# Patient Record
Sex: Male | Born: 1990 | Race: White | Hispanic: No | Marital: Single | Smoking: Former smoker
Health system: Southern US, Community
[De-identification: ages and names within clinical notes are randomized; demographics above are authoritative.]

## PROBLEM LIST (undated history)

## (undated) DIAGNOSIS — M1990 Unspecified osteoarthritis, unspecified site: Secondary | ICD-10-CM

## (undated) DIAGNOSIS — F419 Anxiety disorder, unspecified: Secondary | ICD-10-CM

## (undated) DIAGNOSIS — J45909 Unspecified asthma, uncomplicated: Secondary | ICD-10-CM

## (undated) DIAGNOSIS — J349 Unspecified disorder of nose and nasal sinuses: Secondary | ICD-10-CM

## (undated) DIAGNOSIS — R519 Headache, unspecified: Secondary | ICD-10-CM

## (undated) DIAGNOSIS — R51 Headache: Secondary | ICD-10-CM

## (undated) DIAGNOSIS — N50819 Testicular pain, unspecified: Secondary | ICD-10-CM

## (undated) HISTORY — PX: TONSILLECTOMY: SUR1361

## (undated) HISTORY — DX: Testicular pain, unspecified: N50.819

## (undated) HISTORY — PX: WISDOM TOOTH EXTRACTION: SHX21

## (undated) HISTORY — DX: Anxiety disorder, unspecified: F41.9

## (undated) HISTORY — DX: Unspecified disorder of nose and nasal sinuses: J34.9

## (undated) HISTORY — PX: ELBOW SURGERY: SHX618

## (undated) HISTORY — PX: NASAL SINUS SURGERY: SHX719

---

## 2012-04-08 ENCOUNTER — Ambulatory Visit: Payer: Self-pay | Admitting: Otolaryngology

## 2015-01-27 ENCOUNTER — Ambulatory Visit
Admit: 2015-01-27 | Disposition: A | Discharge status: Home / Self Care | Payer: Self-pay | Attending: Family Medicine | Admitting: Family Medicine

## 2015-01-27 LAB — RAPID INFLUENZA A&B ANTIGENS

## 2015-07-15 ENCOUNTER — Ambulatory Visit
Admission: EM | Admit: 2015-07-15 | Discharge: 2015-07-15 | Disposition: A | Discharge status: Home / Self Care | Payer: Managed Care, Other (non HMO) | Attending: Family Medicine | Admitting: Family Medicine

## 2015-07-15 ENCOUNTER — Encounter: Payer: Self-pay | Admitting: Gynecology

## 2015-07-15 DIAGNOSIS — J0110 Acute frontal sinusitis, unspecified: Secondary | ICD-10-CM

## 2015-07-15 DIAGNOSIS — J0100 Acute maxillary sinusitis, unspecified: Secondary | ICD-10-CM | POA: Diagnosis not present

## 2015-07-15 MED ORDER — AMOXICILLIN-POT CLAVULANATE 875-125 MG PO TABS: 1.0000 | ORAL_TABLET | Freq: Two times a day (BID) | ORAL | Status: DC

## 2015-07-15 MED ORDER — IBUPROFEN 800 MG PO TABS: 800.0000 mg | ORAL_TABLET | Freq: Three times a day (TID) | ORAL | Status: DC | PRN

## 2015-07-15 NOTE — ED Notes (Signed)
Patient c/o sinus headace / pressure x 2-3 weeks.

## 2015-07-15 NOTE — ED Provider Notes (Signed)
[facility] Emergency Department Provider Note  ____________________________________________  Time seen: Approximately 5:14 PM  I have reviewed the triage vital signs and the nursing notes.   HISTORY  Chief Complaint URI   HPI ELBY BLACKWELDER is a 24 y.o. male presents for complaints of runny nose, nasal congestion and sinus pressure x 2-3 weeks. States started out as runny nose and has progressed with sinus pressure. Denies fevers. Denies chest pain, shortness of breath, neck pain, abdominal pain. Reports continues to eat and drink well. Using flonase  And zyrtec D OTC which helped some but did not fully resolve.    History reviewed. No pertinent past medical history.  There are no active problems to display for this patient.   Past Surgical History  Procedure Laterality Date  . Tonsillectomy    . Wisdom tooth extraction    . Elbow surgery      right    No current outpatient prescriptions on file.  Allergies Review of patient's allergies indicates no known allergies.  No family history on file.  Social History Social History  Substance Use Topics  . Smoking status: Never Smoker   . Smokeless tobacco: None  . Alcohol Use: Yes    Review of Systems Constitutional: No fever/chills Eyes: No visual changes. ZOX:WRUEA nose, nasal congestion and sinus pressure.  Cardiovascular: Denies chest pain. Respiratory: Denies shortness of breath. Gastrointestinal: No abdominal pain.  No nausea, no vomiting.  No diarrhea.  No constipation. Genitourinary: Negative for dysuria. Musculoskeletal: Negative for back pain. Skin: Negative for rash. Neurological: Negative for headaches, focal weakness or numbness.  10-point ROS otherwise negative.  ____________________________________________   PHYSICAL EXAM:  VITAL SIGNS: ED Triage Vitals  Enc Vitals Group     BP 07/15/15 1607 130/70 mmHg     Pulse Rate 07/15/15 1607 60     Resp 07/15/15 1607 16   Temp 07/15/15 1607 98.4 F (36.9 C)     Temp Source 07/15/15 1607 Oral     SpO2 07/15/15 1607 100 %     Weight 07/15/15 1607 170 lb (77.111 kg)     Height 07/15/15 1607  (1.956 m)     Head Cir --      Peak Flow --      Pain Score 07/15/15 1613 7     Pain Loc --      Pain Edu? --      Excl. in GC? --     Constitutional: Alert and oriented. Well appearing and in no acute distress. Eyes: Conjunctivae are normal. PERRL. EOMI. Head: Atraumatic. Mod TTP maxillary and frontal sinus TTP, no erythema or swelling.   Ears: no erythema,bilateral TM dullness.   Nose: purulent nasal discharge, with bilateral nasal turbinate edema.   Mouth/Throat: Mucous membranes are moist.  Oropharynx non-erythematous. Neck: No stridor.  No cervical spine tenderness to palpation. Hematological/Lymphatic/Immunilogical: No cervical lymphadenopathy. Cardiovascular: Normal rate, regular rhythm. Grossly normal heart sounds.  Good peripheral circulation. Respiratory: Normal respiratory effort.  No retractions. Lungs CTAB. Gastrointestinal: Soft and nontender. No distention. Normal Bowel sounds.   Musculoskeletal: No lower or upper extremity tenderness nor edema.  No joint effusions. Bilateral pedal pulses equal and easily palpated.  Neurologic:  Normal speech and language. No gross focal neurologic deficits are appreciated. No gait instability. Skin:  Skin is warm, dry and intact. No rash noted. Psychiatric: Mood and affect are normal. Speech and behavior are normal.  ____________________________________________   LABS (all labs ordered are listed, but only  abnormal results are displayed)  Labs Reviewed - No data to display  INITIAL IMPRESSION / ASSESSMENT AND PLAN / ED COURSE  Pertinent labs & imaging results that were available during my care of the patient were reviewed by me and considered in my medical decision making (see chart for details).  Presents for runny nose, congestion, sinus pressure x 2  weeks. Reports unrelieved with OTC medications. Will treat maxillary and frontal sinusitis with augmentin and prn ibuprofen. Supportive treatments, rest, fluids. Discussed follow up with Primary care physician this week. Discussed follow up and return parameters including no resolution or any worsening concerns. Patient verbalized understanding and agreed to plan.   ____________________________________________   FINAL CLINICAL IMPRESSION(S) / ED DIAGNOSES  Final diagnoses:  Acute maxillary sinusitis, recurrence not specified  Acute frontal sinusitis, recurrence not specified       Renford Dills, NP 07/15/15 1731

## 2015-07-15 NOTE — Discharge Instructions (Signed)
Take medication as prescribed. Rest. Drink plenty of fluids.   Follow up with your primary care physician as needed. Return to Urgent care for new or worsening concerns.   Sinusitis Sinusitis is redness, soreness, and inflammation of the paranasal sinuses. Paranasal sinuses are air pockets within the bones of your face (beneath the eyes, the middle of the forehead, or above the eyes). In healthy paranasal sinuses, mucus is able to drain out, and air is able to circulate through them by way of your nose. However, when your paranasal sinuses are inflamed, mucus and air can become trapped. This can allow bacteria and other germs to grow and cause infection. Sinusitis can develop quickly and last only a short time (acute) or continue over a long period (chronic). Sinusitis that lasts for more than 12 weeks is considered chronic.  CAUSES  Causes of sinusitis include:  Allergies.  Structural abnormalities, such as displacement of the cartilage that separates your nostrils (deviated septum), which can decrease the air flow through your nose and sinuses and affect sinus drainage.  Functional abnormalities, such as when the small hairs (cilia) that line your sinuses and help remove mucus do not work properly or are not present. SIGNS AND SYMPTOMS  Symptoms of acute and chronic sinusitis are the same. The primary symptoms are pain and pressure around the affected sinuses. Other symptoms include:  Upper toothache.  Earache.  Headache.  Bad breath.  Decreased sense of smell and taste.  A cough, which worsens when you are lying flat.  Fatigue.  Fever.  Thick drainage from your nose, which often is green and may contain pus (purulent).  Swelling and warmth over the affected sinuses. DIAGNOSIS  Your health care provider will perform a physical exam. During the exam, your health care provider may:  Look in your nose for signs of abnormal growths in your nostrils (nasal polyps).  Tap over  the affected sinus to check for signs of infection.  View the inside of your sinuses (endoscopy) using an imaging device that has a light attached (endoscope). If your health care provider suspects that you have chronic sinusitis, one or more of the following tests may be recommended:  Allergy tests.  Nasal culture. A sample of mucus is taken from your nose, sent to a lab, and screened for bacteria.  Nasal cytology. A sample of mucus is taken from your nose and examined by your health care provider to determine if your sinusitis is related to an allergy. TREATMENT  Most cases of acute sinusitis are related to a viral infection and will resolve on their own within 10 days. Sometimes medicines are prescribed to help relieve symptoms (pain medicine, decongestants, nasal steroid sprays, or saline sprays).  However, for sinusitis related to a bacterial infection, your health care provider will prescribe antibiotic medicines. These are medicines that will help kill the bacteria causing the infection.  Rarely, sinusitis is caused by a fungal infection. In theses cases, your health care provider will prescribe antifungal medicine. For some cases of chronic sinusitis, surgery is needed. Generally, these are cases in which sinusitis recurs more than 3 times per year, despite other treatments. HOME CARE INSTRUCTIONS   Drink plenty of water. Water helps thin the mucus so your sinuses can drain more easily.  Use a humidifier.  Inhale steam 3 to 4 times a day (for example, sit in the bathroom with the shower running).  Apply a warm, moist washcloth to your face 3 to 4 times a day, or  as directed by your health care provider.  Use saline nasal sprays to help moisten and clean your sinuses.  Take medicines only as directed by your health care provider.  If you were prescribed either an antibiotic or antifungal medicine, finish it all even if you start to feel better. SEEK IMMEDIATE MEDICAL CARE  IF:  You have increasing pain or severe headaches.  You have nausea, vomiting, or drowsiness.  You have swelling around your face.  You have vision problems.  You have a stiff neck.  You have difficulty breathing. MAKE SURE YOU:   Understand these instructions.  Will watch your condition.  Will get help right away if you are not doing well or get worse. Document Released: 10/07/2005 Document Revised: 02/21/2014 Document Reviewed: 10/22/2011 [facility] Patient Information 2015 [place], [place]. This information is not intended to replace advice given to you by your health care provider. Make sure you discuss any questions you have with your health care provider.

## 2015-07-22 ENCOUNTER — Ambulatory Visit
Admission: EM | Admit: 2015-07-22 | Discharge: 2015-07-22 | Disposition: A | Discharge status: Home / Self Care | Payer: Managed Care, Other (non HMO) | Attending: Internal Medicine | Admitting: Internal Medicine

## 2015-07-22 DIAGNOSIS — J019 Acute sinusitis, unspecified: Secondary | ICD-10-CM | POA: Diagnosis not present

## 2015-07-22 MED ORDER — PREDNISONE 50 MG PO TABS: 50.0000 mg | ORAL_TABLET | Freq: Every day | ORAL | Status: DC

## 2015-07-22 NOTE — Discharge Instructions (Signed)
Prescription for prednisone sent to the pharmacy. Consider followup with ENT to discuss additional management of sinus difficulties.

## 2015-07-22 NOTE — ED Notes (Signed)
Pt you were here Sept 24th dx with sinusitis given augmentin and ibuprofen, has 4 days left and started to feel better until this morning when sinus pressure came back and symptoms are as bad as they were on the 24th

## 2015-07-22 NOTE — ED Provider Notes (Signed)
CSN: [id number]     Arrival date & time 07/22/15  1426 History   First MD Initiated Contact with Patient 07/22/15 1541     Chief Complaint  Patient presents with  . Sinusitis   HPI  Patient is a 24 year old who was seen here a week ago with 2-3 weeks history of sinus symptoms. He was treated with Augmentin, and improved for several days, but now in the last 3 days is feeling worse again. By worse, he means that he is having more congestion, some discomfort in his teeth again and facial pressure/headache. He's also having a little bit of dizziness, off balance feeling, but has not fallen. No fever, no runny nose, and cough is improving.  History reviewed. No pertinent past medical history. Past Surgical History  Procedure Laterality Date  . Tonsillectomy    . Wisdom tooth extraction    . Elbow surgery      right    Social History  Substance Use Topics  . Smoking status: Never Smoker   . Smokeless tobacco: None  . Alcohol Use: Yes    Review of Systems  All other systems reviewed and are negative.   Allergies  Review of patient's allergies indicates no known allergies.  Home Medications   Prior to Admission medications   Medication Sig Start Date End Date Taking? Authorizing Provider  cetirizine (ZYRTEC) 10 MG tablet Take 10 mg by mouth daily.   Yes Historical Provider, MD  fluticasone (FLONASE) 50 MCG/ACT nasal spray Place into both nostrils daily.   Yes Historical Provider, MD  amoxicillin-clavulanate (AUGMENTIN) 875-125 MG per tablet Take 1 tablet by mouth every 12 (twelve) hours. 07/15/15   Renford Dills, NP  ibuprofen (ADVIL,MOTRIN) 800 MG tablet Take 1 tablet (800 mg total) by mouth every 8 (eight) hours as needed for mild pain or moderate pain. 07/15/15   Renford Dills, NP  predniSONE (DELTASONE) 50 MG tablet Take 1 tablet (50 mg total) by mouth daily. 07/22/15   Eustace Moore, MD   Meds Ordered and Administered this Visit  Medications - No data to display  BP 132/85  mmHg  Pulse 62  Temp(Src) 97.8 F (36.6 C) (Oral)  Ht 6' (1.829 m)  Wt 160 lb 6.4 oz (72.757 kg)  BMI 21.75 kg/m2  SpO2 100% No data found.   Physical Exam  Constitutional: He is oriented to person, place, and time. No distress.  Alert, nicely groomed  HENT:  Head: Atraumatic.  Bilateral TMs are moderately dull, right greater than left, no erythema Moderately severe nasal congestion, with erythema but no visible ulceration Throat is a little bit red  Eyes:  Conjugate gaze, no eye redness/drainage  Neck: Neck supple.  Cardiovascular: Normal rate and regular rhythm.   Pulmonary/Chest: No respiratory distress.  Lungs clear, symmetric breath sounds  Abdominal: Soft. He exhibits no distension.  Musculoskeletal: Normal range of motion.  Neurological: He is alert and oriented to person, place, and time.  Skin: Skin is warm and dry.  Pink. No cyanosis  Nursing note and vitals reviewed.   ED Course  Procedures (including critical care time)    MDM   1. Acute sinusitis with symptoms > 10 days    Finish Augmentin. Prescription for prednisone burst, 3-5 days. Continue Zyrtec-D and Flonase. Recheck for new fever greater than 100.5, increasing phlegm production, or if not starting to improve in several days.    Eustace Moore, MD 07/22/15 [id number]

## 2015-08-02 ENCOUNTER — Encounter: Payer: Self-pay | Admitting: Emergency Medicine

## 2015-08-02 ENCOUNTER — Ambulatory Visit
Admission: EM | Admit: 2015-08-02 | Discharge: 2015-08-02 | Disposition: A | Discharge status: Home / Self Care | Payer: Managed Care, Other (non HMO) | Attending: Family Medicine | Admitting: Family Medicine

## 2015-08-02 DIAGNOSIS — J019 Acute sinusitis, unspecified: Secondary | ICD-10-CM

## 2015-08-02 MED ORDER — CEFUROXIME AXETIL 250 MG PO TABS: 250.0000 mg | ORAL_TABLET | Freq: Two times a day (BID) | ORAL | Status: DC

## 2015-08-02 NOTE — ED Notes (Signed)
Patient c/o sinus pain and pressure and HAs fore several weeks.  Patient denies fevers.

## 2015-08-02 NOTE — ED Provider Notes (Signed)
CSN: [id number]     Arrival date & time 08/02/15  [id number] History   First MD Initiated Contact with Patient 08/02/15 [phone]     Chief Complaint  Patient presents with  . Facial Pain  . Headache   (Consider location/radiation/quality/duration/timing/severity/associated sxs/prior Treatment) HPI  This 24 year old male who returns today complaining of increasing sinus pain and pressure and headaches are not responded to the use of Augmentin and prednisone. He states that he will usually have one episode of sinusitis a year that responds well to the Augmentin and on occasion will require prednisone. He uses [place] daily along with Flonase and Zyrtec-D. He states that he wakes in the morning with his eyes swollen and very stuffy sinus passage and after use of Nettie pot and Flonase and  first dose  of Zyrtec-D his symptoms improve by mid morning but then returns in the afternoon. He is becoming "worn out" because of the chronicity not improving despite his best effort. Denies any fever or chills. He works at a [employer] company using a multitude of chemicals but states that he does not seem to worsen his symptoms as he spends a great deal was time in college at [employer] [place]. He has a long history of allergies.  History reviewed. No pertinent past medical history. Past Surgical History  Procedure Laterality Date  . Tonsillectomy    . Wisdom tooth extraction    . Elbow surgery      right   History reviewed. No pertinent family history. Social History  Substance Use Topics  . Smoking status: Never Smoker   . Smokeless tobacco: None  . Alcohol Use: Yes    Review of Systems  Constitutional: Positive for activity change. Negative for fever, chills and fatigue.  HENT: Positive for congestion, facial swelling, postnasal drip, rhinorrhea, sinus pressure, sneezing and sore throat.   Eyes: Positive for itching.  All other systems reviewed and are negative.   Allergies  Review  of patient's allergies indicates no known allergies.  Home Medications   Prior to Admission medications   Medication Sig Start Date End Date Taking? Authorizing Provider  amoxicillin-clavulanate (AUGMENTIN) 875-125 MG per tablet Take 1 tablet by mouth every 12 (twelve) hours. 07/15/15   Renford DillsLindsey Miller, NP  cefUROXime (CEFTIN) 250 MG tablet Take 1 tablet (250 mg total) by mouth 2 (two) times daily with a meal. 08/02/15   Lutricia FeilWilliam P Brolin Dambrosia, PA-C  cetirizine (ZYRTEC) 10 MG tablet Take 10 mg by mouth daily.    Historical Provider, MD  fluticasone (FLONASE) 50 MCG/ACT nasal spray Place into both nostrils daily.    Historical Provider, MD  ibuprofen (ADVIL,MOTRIN) 800 MG tablet Take 1 tablet (800 mg total) by mouth every 8 (eight) hours as needed for mild pain or moderate pain. 07/15/15   Renford DillsLindsey Miller, NP  predniSONE (DELTASONE) 50 MG tablet Take 1 tablet (50 mg total) by mouth daily. 07/22/15   Eustace MooreLaura W Murray, MD   Meds Ordered and Administered this Visit  Medications - No data to display  BP 140/97 mmHg  Pulse 96  Temp(Src) 98.1 F (36.7 C) (Oral)  Resp 16  Ht 6' (1.829 m)  Wt 165 lb (74.844 kg)  BMI 22.37 kg/m2  SpO2 100% No data found.   Physical Exam  Constitutional: He is oriented to person, place, and time. He appears well-developed and well-nourished. No distress.  HENT:  Head: Normocephalic and atraumatic.  Both ears are dull with the right greater than the  left. He has a significant percussion pain over maxillary sinuses. The oropharynx is benign. He has no anterior cervical adenopathy appreciated.  Eyes: Pupils are equal, round, and reactive to light.  Neck: Neck supple.  Pulmonary/Chest: Effort normal and breath sounds normal. No respiratory distress. He has no wheezes. He has no rales.  Musculoskeletal: Normal range of motion. He exhibits no edema or tenderness.  Lymphadenopathy:    He has no cervical adenopathy.  Neurological: He is alert and oriented to person, place,  and time.  Skin: Skin is warm and dry. He is not diaphoretic.  Psychiatric: He has a normal mood and affect. His behavior is normal. Judgment and thought content normal.  Nursing note and vitals reviewed.   ED Course  Procedures (including critical care time)  Labs Review Labs Reviewed - No data to display  Imaging Review No results found.   Visual Acuity Review  Right Eye Distance:   Left Eye Distance:   Bilateral Distance:    Right Eye Near:   Left Eye Near:    Bilateral Near:         MDM   1. Acute sinusitis treated with antibiotics in the past 60 days    Discharge Medication List as of 08/02/2015 10:27 AM    START taking these medications   Details  cefUROXime (CEFTIN) 250 MG tablet Take 1 tablet (250 mg total) by mouth 2 (two) times daily with a meal., Starting 08/02/2015, Until Discontinued, Print      Plan: 1. Test/x-ray results and diagnosis reviewed with patient 2. rx as per orders; risks, benefits, potential side effects reviewed with patient 3. Recommend supportive treatment with continued use of a Nettie pot and Flonase and Zyrtec. 4. F/u Dr. Carron Curie, ENT for evaluation and further treatment cause of the chronicity of his problem. I have told him that he may have more of an allergic sinusitis than a bacterial or possibly Dr. Carron Curie be able to  sort this out. I will attempt 1 more trial of antibiotics and switched him to Ceftin 250 twice a day for 3 weeks pending his appointment with ENT. He will not change the use of the Nettie pot Flonase and Zyrtec. He was given instructions for ENT office and he will make the appointment according to his schedule.   Lutricia Feil, PA-C 08/02/15 1034

## 2015-08-02 NOTE — Discharge Instructions (Signed)

## 2015-08-24 ENCOUNTER — Encounter: Payer: Self-pay | Admitting: *Deleted

## 2015-08-29 NOTE — Discharge Instructions (Signed)
[facility] °[facility] °ENDOSCOPIC SINUS SURGERY °[facility] ° °What is Functional Endoscopic Sinus Surgery? ° The Surgery involves making the natural openings of the sinuses larger by removing the bony partitions that separate the sinuses from the nasal cavity.  The natural sinus lining is preserved as much as possible to allow the sinuses to resume normal function after the surgery.  In some patients nasal polyps (excessively swollen lining of the sinuses) may be removed to relieve obstruction of the sinus openings.  The surgery is performed through the nose using lighted scopes, which eliminates the need for incisions on the face.  A septoplasty is a different procedure which is sometimes performed with sinus surgery.  It involves straightening the boy partition that separates the two sides of your nose.  A crooked or deviated septum may need repair if is obstructing the sinuses or nasal airflow.  Turbinate reduction is also often performed during sinus surgery.  The turbinates are bony proturberances from the side walls of the nose which swell and can obstruct the nose in patients with sinus and allergy problems.  Their size can be surgically reduced to help relieve nasal obstruction. ° °What Can Sinus Surgery Do For Me? ° Sinus surgery can reduce the frequency of sinus infections requiring antibiotic treatment.  This can provide improvement in nasal congestion, post-nasal drainage, facial pressure and nasal obstruction.  Surgery will NOT prevent you from ever having an infection again, so it usually only for patients who get infections 4 or more times yearly requiring antibiotics, or for infections that do not clear with antibiotics.  It will not cure nasal allergies, so patients with allergies may still require medication to treat their allergies after surgery. Surgery may improve headaches related to sinusitis, however, some people will continue to  require medication to control sinus headaches related to allergies.  Surgery will do nothing for other forms of headache (migraine, tension or cluster). ° °What Are the Risks of Endoscopic Sinus Surgery? ° Current techniques allow surgery to be performed safely with little risk, however, there are rare complications that patients should be aware of.  Because the sinuses are located around the eyes, there is risk of eye injury, including blindness, though again, this would be quite rare. This is usually a result of bleeding behind the eye during surgery, which puts the vision oat risk, though there are treatments to protect the vision and prevent permanent disrupted by surgery causing a leak of the spinal fluid that surrounds the brain.  More serious complications would include bleeding inside the brain cavity or damage to the brain.  Again, all of these complications are uncommon, and spinal fluid leaks can be safely managed surgically if they occur.  The most common complication of sinus surgery is bleeding from the nose, which may require packing or cauterization of the nose.  Continued sinus have polyps may experience recurrence of the polyps requiring revision surgery.  Alterations of sense of smell or injury to the tear ducts are also rare complications.  ° °What is the Surgery Like, and what is the Recovery? ° The Surgery usually takes a couple of hours to perform, and is usually performed under a general anesthetic (completely asleep).  Patients are usually discharged home after a couple of hours.  Sometimes during surgery it is necessary to pack the nose to control bleeding, and the packing is left in place for 24 - 48 hours, and removed by your surgeon.    If a septoplasty was performed during the procedure, there is often a splint placed which must be removed after 5-7 days.   °Discomfort: Pain is usually mild to moderate, and can be controlled by prescription pain medication or acetaminophen (Tylenol).   Aspirin, Ibuprofen (Advil, Motrin), or Naprosyn (Aleve) should be avoided, as they can cause increased bleeding.  Most patients feel sinus pressure like they have a bad head cold for several days.  Sleeping with your head elevated can help reduce swelling and facial pressure, as can ice packs over the face.  A humidifier may be helpful to keep the mucous and blood from drying in the nose.  ° °Diet: There are no specific diet restrictions, however, you should generally start with clear liquids and a light diet of bland foods because the anesthetic can cause some nausea.  Advance your diet depending on how your stomach feels.  Taking your pain medication with food will often help reduce stomach upset which pain medications can cause. ° °Nasal Saline Irrigation: It is important to remove blood clots and dried mucous from the nose as it is healing.  This is done by having you irrigate the nose at least 3 - 4 times daily with a salt water solution.  We recommend using NeilMed Sinus Rinse (available at the drug store).  Fill the squeeze bottle with the solution, bend over a sink, and insert the tip of the squeeze bottle into the nose ½ of an inch.  Point the tip of the squeeze bottle towards the inside corner of the eye on the same side your irrigating.  Squeeze the bottle and gently irrigate the nose.  If you bend forward as you do this, most of the fluid will flow back out of the nose, instead of down your throat.   The solution should be warm, near body temperature, when you irrigate.   Each time you irrigate, you should use a full squeeze bottle.  ° °Note that if you are instructed to use Nasal Steroid Sprays at any time after your surgery, irrigate with saline BEFORE using the steroid spray, so you do not wash it all out of the nose. °Another product, Nasal Saline Gel (such as AYR Nasal Saline Gel) can be applied in each nostril 3 - 4 times daily to moisture the nose and reduce scabbing or crusting. ° °Bleeding:   Bloody drainage from the nose can be expected for several days, and patients are instructed to irrigate their nose frequently with salt water to help remove mucous and blood clots.  The drainage may be dark red or brown, though some fresh blood may be seen intermittently, especially after irrigation.  Do not blow you nose, as bleeding may occur. If you must sneeze, keep your mouth open to allow air to escape through your mouth. ° °If heavy bleeding occurs: Irrigate the nose with saline to rinse out clots, then spray the nose 3 - 4 times with Afrin Nasal Decongestant Spray.  The spray will constrict the blood vessels to slow bleeding.  Pinch the lower half of your nose shut to apply pressure, and lay down with your head elevated.  Ice packs over the nose may help as well. If bleeding persists despite these measures, you should notify your doctor.  Do not use the Afrin routinely to control nasal congestion after surgery, as it can result in worsening congestion and may affect healing.  ° ° ° °Activity: Return to work varies among patients. Most patients will be   out of work at least 5 - 7 days to recover.  Patient may return to work after they are off of narcotic pain medication, and feeling well enough to perform the functions of their job.  Patients must avoid heavy lifting (over 10 pounds) or strenuous physical for 2 weeks after surgery, so your employer may need to assign you to light duty, or keep you out of work longer if light duty is not possible.  NOTE: you should not drive, operate dangerous machinery, do any mentally demanding tasks or make any important legal or financial decisions while on narcotic pain medication and recovering from the general anesthetic.  °  °Call Your Doctor Immediately if You Have Any of the Following: °1. Bleeding that you cannot control with the above measures °2. Loss of vision, double vision, bulging of the eye or black eyes. °3. Fever over 101 degrees °4. Neck stiffness with  severe headache, fever, nausea and change in mental state. °You are always encourage to call anytime with concerns, however, please call with requests for pain medication refills during office hours. ° °Office Endoscopy: During follow-up visits your doctor will remove any packing or splints that may have been placed and evaluate and clean your sinuses endoscopically.  Topical anesthetic will be used to make this as comfortable as possible, though you may want to take your pain medication prior to the visit.  How often this will need to be done varies from patient to patient.  After complete recovery from the surgery, you may need follow-up endoscopy from time to time, particularly if there is concern of recurrent infection or nasal polyps. ° °General Anesthesia, Adult, Care After °Refer to this sheet in the next few weeks. These instructions provide you with information on caring for yourself after your procedure. Your health care provider may also give you more specific instructions. Your treatment has been planned according to current medical practices, but problems sometimes occur. Call your health care provider if you have any problems or questions after your procedure. °WHAT TO EXPECT AFTER THE PROCEDURE °After the procedure, it is typical to experience: °· Sleepiness. °· Nausea and vomiting. °HOME CARE INSTRUCTIONS °· For the first 24 hours after general anesthesia: °¨ Have a responsible person with you. °¨ Do not drive a car. If you are alone, do not take public transportation. °¨ Do not drink alcohol. °¨ Do not take medicine that has not been prescribed by your health care provider. °¨ Do not sign important papers or make important decisions. °¨ You may resume a normal diet and activities as directed by your health care provider. °· Change bandages (dressings) as directed. °· If you have questions or problems that seem related to general anesthesia, call the hospital and ask for the anesthetist or  anesthesiologist on call. °SEEK MEDICAL CARE IF: °· You have nausea and vomiting that continue the day after anesthesia. °· You develop a rash. °SEEK IMMEDIATE MEDICAL CARE IF:  °· You have difficulty breathing. °· You have chest pain. °· You have any allergic problems. °  °This information is not intended to replace advice given to you by your health care provider. Make sure you discuss any questions you have with your health care provider. °  °Document Released: 01/13/2001 Document Revised: 10/28/2014 Document Reviewed: 02/05/2012 °Elsevier Interactive Patient Education ©2016 [employer] ° °

## 2015-08-30 ENCOUNTER — Ambulatory Visit: Payer: Managed Care, Other (non HMO) | Admitting: Anesthesiology

## 2015-08-30 ENCOUNTER — Encounter
Admission: RE | Disposition: A | Discharge status: Home / Self Care | Payer: Self-pay | Source: Ambulatory Visit | Attending: Otolaryngology

## 2015-08-30 ENCOUNTER — Ambulatory Visit: Admit: 2015-08-30 | Payer: Self-pay | Admitting: Otolaryngology

## 2015-08-30 ENCOUNTER — Encounter: Payer: Self-pay | Admitting: Otolaryngology

## 2015-08-30 ENCOUNTER — Ambulatory Visit
Admission: RE | Admit: 2015-08-30 | Discharge: 2015-08-30 | Disposition: A | Discharge status: Home / Self Care | Payer: Managed Care, Other (non HMO) | Source: Ambulatory Visit | Attending: Otolaryngology | Admitting: Otolaryngology

## 2015-08-30 DIAGNOSIS — J342 Deviated nasal septum: Secondary | ICD-10-CM | POA: Diagnosis present

## 2015-08-30 DIAGNOSIS — Z791 Long term (current) use of non-steroidal anti-inflammatories (NSAID): Secondary | ICD-10-CM | POA: Diagnosis not present

## 2015-08-30 DIAGNOSIS — M1990 Unspecified osteoarthritis, unspecified site: Secondary | ICD-10-CM | POA: Insufficient documentation

## 2015-08-30 DIAGNOSIS — J343 Hypertrophy of nasal turbinates: Secondary | ICD-10-CM | POA: Insufficient documentation

## 2015-08-30 DIAGNOSIS — J3489 Other specified disorders of nose and nasal sinuses: Secondary | ICD-10-CM | POA: Insufficient documentation

## 2015-08-30 DIAGNOSIS — J329 Chronic sinusitis, unspecified: Secondary | ICD-10-CM | POA: Diagnosis present

## 2015-08-30 DIAGNOSIS — Z7951 Long term (current) use of inhaled steroids: Secondary | ICD-10-CM | POA: Diagnosis not present

## 2015-08-30 HISTORY — DX: Unspecified asthma, uncomplicated: J45.909

## 2015-08-30 HISTORY — PX: SEPTOPLASTY: SHX2393

## 2015-08-30 HISTORY — DX: Unspecified osteoarthritis, unspecified site: M19.90

## 2015-08-30 HISTORY — PX: TURBINATE REDUCTION: SHX6157

## 2015-08-30 HISTORY — PX: IMAGE GUIDED SINUS SURGERY: SHX6570

## 2015-08-30 HISTORY — PX: MAXILLARY ANTROSTOMY: SHX2003

## 2015-08-30 HISTORY — DX: Headache, unspecified: R51.9

## 2015-08-30 HISTORY — DX: Headache: R51

## 2015-08-30 SURGERY — SEPTOPLASTY, NOSE, WITH NASAL TURBINATE REDUCTION
Anesthesia: General | Laterality: Bilateral

## 2015-08-30 SURGERY — SINUS SURGERY, WITH IMAGING GUIDANCE
Anesthesia: General | Wound class: Clean Contaminated

## 2015-08-30 MED ORDER — ROCURONIUM BROMIDE 100 MG/10ML IV SOLN
INTRAVENOUS | Status: DC | PRN
  Administered 2015-08-30: 30 mg via INTRAVENOUS

## 2015-08-30 MED ORDER — FENTANYL CITRATE (PF) 100 MCG/2ML IJ SOLN
INTRAMUSCULAR | Status: DC | PRN
  Administered 2015-08-30: 100 ug via INTRAVENOUS
  Administered 2015-08-30 (×2): 50 ug via INTRAVENOUS

## 2015-08-30 MED ORDER — ACETAMINOPHEN 10 MG/ML IV SOLN
1000.0000 mg | Freq: Once | INTRAVENOUS | Status: AC
  Administered 2015-08-30: 1000 mg via INTRAVENOUS

## 2015-08-30 MED ORDER — SUCCINYLCHOLINE CHLORIDE 20 MG/ML IJ SOLN
INTRAMUSCULAR | Status: DC | PRN
  Administered 2015-08-30: 100 mg via INTRAVENOUS

## 2015-08-30 MED ORDER — DEXAMETHASONE SODIUM PHOSPHATE 4 MG/ML IJ SOLN
INTRAMUSCULAR | Status: DC | PRN
  Administered 2015-08-30: 8 mg via INTRAVENOUS

## 2015-08-30 MED ORDER — PROMETHAZINE HCL 25 MG/ML IJ SOLN: 6.2500 mg | INTRAMUSCULAR | Status: DC | PRN

## 2015-08-30 MED ORDER — LACTATED RINGERS IV SOLN
INTRAVENOUS | Status: DC
  Administered 2015-08-30: 10:00:00 via INTRAVENOUS

## 2015-08-30 MED ORDER — OXYCODONE HCL 5 MG PO TABS
5.0000 mg | ORAL_TABLET | Freq: Once | ORAL | Status: AC | PRN
  Administered 2015-08-30: 5 mg via ORAL

## 2015-08-30 MED ORDER — OXYMETAZOLINE HCL 0.05 % NA SOLN
NASAL | Status: DC | PRN
  Administered 2015-08-30: 1 via TOPICAL

## 2015-08-30 MED ORDER — LIDOCAINE-EPINEPHRINE 1 %-1:100000 IJ SOLN
INTRAMUSCULAR | Status: DC | PRN
  Administered 2015-08-30: 8 mL

## 2015-08-30 MED ORDER — LIDOCAINE HCL (CARDIAC) 20 MG/ML IV SOLN
INTRAVENOUS | Status: DC | PRN
  Administered 2015-08-30: 40 mg via INTRAVENOUS

## 2015-08-30 MED ORDER — PROMETHAZINE HCL 12.5 MG PO TABS: 12.5000 mg | ORAL_TABLET | Freq: Four times a day (QID) | ORAL | Status: DC | PRN

## 2015-08-30 MED ORDER — MIDAZOLAM HCL 5 MG/5ML IJ SOLN
INTRAMUSCULAR | Status: DC | PRN
  Administered 2015-08-30: 2 mg via INTRAVENOUS

## 2015-08-30 MED ORDER — ONDANSETRON HCL 4 MG/2ML IJ SOLN
INTRAMUSCULAR | Status: DC | PRN
  Administered 2015-08-30: 4 mg via INTRAVENOUS

## 2015-08-30 MED ORDER — MEPERIDINE HCL 25 MG/ML IJ SOLN: 6.2500 mg | INTRAMUSCULAR | Status: DC | PRN

## 2015-08-30 MED ORDER — HYDROMORPHONE HCL 1 MG/ML IJ SOLN
0.2500 mg | INTRAMUSCULAR | Status: DC | PRN
  Administered 2015-08-30: 0.4 mg via INTRAVENOUS
  Administered 2015-08-30: 0.5 mg via INTRAVENOUS
  Administered 2015-08-30: 0.25 mg via INTRAVENOUS
  Administered 2015-08-30: 0.4 mg via INTRAVENOUS
  Administered 2015-08-30: 0.25 mg via INTRAVENOUS

## 2015-08-30 MED ORDER — BACITRACIN 500 UNIT/GM EX OINT
TOPICAL_OINTMENT | CUTANEOUS | Status: DC | PRN
  Administered 2015-08-30: 1 via TOPICAL

## 2015-08-30 MED ORDER — OXYCODONE HCL 5 MG/5ML PO SOLN: 5.0000 mg | Freq: Once | ORAL | Status: AC | PRN

## 2015-08-30 MED ORDER — PROPOFOL 10 MG/ML IV BOLUS
INTRAVENOUS | Status: DC | PRN
  Administered 2015-08-30: 200 mg via INTRAVENOUS

## 2015-08-30 MED ORDER — OXYCODONE-ACETAMINOPHEN 5-325 MG PO TABS: 1.0000 | ORAL_TABLET | ORAL | Status: DC | PRN

## 2015-08-30 MED ORDER — SULFAMETHOXAZOLE-TRIMETHOPRIM 800-160 MG PO TABS: 1.0000 | ORAL_TABLET | Freq: Two times a day (BID) | ORAL | Status: DC

## 2015-08-30 MED ORDER — SUCCINYLCHOLINE CHLORIDE 20 MG/ML IJ SOLN: INTRAMUSCULAR | Status: DC | PRN

## 2015-08-30 SURGICAL SUPPLY — 40 items
BALLOON SINUPLASTY SYSTEM (BALLOONS) IMPLANT
BATTERY INSTRU NAVIGATION (MISCELLANEOUS) ×12 IMPLANT
BLADE IRRIGATOR 40D CVD (IRRIGATION / IRRIGATOR) IMPLANT
CANISTER SUCT 1200ML W/VALVE (MISCELLANEOUS) ×3 IMPLANT
COAG SUCT 10F 3.5MM HAND CTRL (MISCELLANEOUS) ×3 IMPLANT
DEVICE INFLATION SEID (MISCELLANEOUS) IMPLANT
DRAPE HEAD BAR (DRAPES) ×3 IMPLANT
DRESSING NASL FOAM PST OP SINU (MISCELLANEOUS) ×4 IMPLANT
DRSG NASAL 4CM NASOPORE (MISCELLANEOUS) IMPLANT
DRSG NASAL FOAM POST OP SINU (MISCELLANEOUS) ×6
GLOVE BIO SURGEON STRL SZ7.5 (GLOVE) ×12 IMPLANT
GOWN STRL REUS W/ TWL LRG LVL3 (GOWN DISPOSABLE) ×2 IMPLANT
GOWN STRL REUS W/TWL LRG LVL3 (GOWN DISPOSABLE) ×1
IRRIGATOR 4MM STR (IRRIGATION / IRRIGATOR) IMPLANT
IV NS 500ML (IV SOLUTION)
IV NS 500ML BAXH (IV SOLUTION) IMPLANT
NAVIGATION MASK REG  ST (MISCELLANEOUS) ×3 IMPLANT
NEEDLE HYPO 25GX1X1/2 BEV (NEEDLE) ×3 IMPLANT
NS IRRIG 500ML POUR BTL (IV SOLUTION) ×3 IMPLANT
PACK DRAPE NASAL/ENT (PACKS) ×3 IMPLANT
PACKING NASAL EPIS 4X2.4 XEROG (MISCELLANEOUS) IMPLANT
PAD GROUND ADULT SPLIT (MISCELLANEOUS) ×3 IMPLANT
PATTIES SURGICAL .5 X3 (DISPOSABLE) ×3 IMPLANT
SET HANDPIECE IRR DIEGO (MISCELLANEOUS) IMPLANT
SINUPLASTY SPHENOID GUIDE (MISCELLANEOUS) IMPLANT
SOL ANTI-FOG 6CC FOG-OUT (MISCELLANEOUS) ×2 IMPLANT
SOL FOG-OUT ANTI-FOG 6CC (MISCELLANEOUS) ×1
SPLINT NASAL SEPTAL PRE-CUT (MISCELLANEOUS) IMPLANT
STRAP BODY AND KNEE 60X3 (MISCELLANEOUS) ×6 IMPLANT
SUT CHROMIC 4 0 RB 1X27 (SUTURE) ×3 IMPLANT
SUT ETHILON 3-0 FS-10 30 BLK (SUTURE)
SUT ETHILON 4-0 (SUTURE) ×1
SUT ETHILON 4-0 FS2 18XMFL BLK (SUTURE) ×2
SUT PLAIN GUT 4-0 (SUTURE) IMPLANT
SUTURE EHLN 3-0 FS-10 30 BLK (SUTURE) IMPLANT
SUTURE ETHLN 4-0 FS2 18XMF BLK (SUTURE) ×2 IMPLANT
SYR BULB EAR ULCER 3OZ GRN STR (SYRINGE) ×3 IMPLANT
SYRINGE 10CC LL (SYRINGE) ×3 IMPLANT
TOWEL OR 17X26 4PK STRL BLUE (TOWEL DISPOSABLE) IMPLANT
WATER STERILE IRR 500ML POUR (IV SOLUTION) IMPLANT

## 2015-08-30 NOTE — Anesthesia Postprocedure Evaluation (Signed)
  Anesthesia Post-op Note  Patient: Dominic Barnes  Procedure(s) Performed: Procedure(s) with comments: IMAGE GUIDED SINUS SURGERY (N/A) - GAVE DISK TO [name] 11/3 MAXILLARY ANTROSTOMY (Bilateral) SEPTOPLASTY (N/A) INFERIOR TURBINATE REDUCTION  (Bilateral)  Anesthesia type:General  Patient location: PACU  Post pain: Pain level controlled  Post assessment: Post-op Vital signs reviewed, Patient's Cardiovascular Status Stable, Respiratory Function Stable, Patent Airway and No signs of Nausea or vomiting  Post vital signs: Reviewed and stable  Last Vitals:  Filed Vitals:   08/30/15 1400  BP: 137/82  Pulse: 68  Temp:   Resp: 11    Level of consciousness: awake, alert  and patient cooperative  Complications: No apparent anesthesia complications

## 2015-08-30 NOTE — Anesthesia Procedure Notes (Signed)
Procedure Name: Intubation Date/Time: 08/30/2015 10:50 AM Performed by: Andee PolesBUSH, Milla Wahlberg Pre-anesthesia Checklist: Patient identified, Emergency Drugs available, Suction available, Patient being monitored and Timeout performed Patient Re-evaluated:Patient Re-evaluated prior to inductionOxygen Delivery Method: Circle system utilized Preoxygenation: Pre-oxygenation with 100% oxygen Intubation Type: IV induction Ventilation: Mask ventilation without difficulty Laryngoscope Size: Mac and 3 Grade View: Grade I Tube type: Oral Rae Tube size: 7.5 mm Number of attempts: 1 Placement Confirmation: ETT inserted through vocal cords under direct vision,  positive ETCO2 and breath sounds checked- equal and bilateral Tube secured with: Tape Dental Injury: Teeth and Oropharynx as per pre-operative assessment

## 2015-08-30 NOTE — H&P (Signed)
..  History and Physical paper copy reviewed and updated date of procedure and will be scanned into system.  

## 2015-08-30 NOTE — Anesthesia Preprocedure Evaluation (Signed)
Anesthesia Evaluation  Patient identified by MRN, date of birth, ID band Patient awake    Reviewed: Allergy & Precautions, NPO status , Patient's Chart, lab work & pertinent test results, reviewed documented beta blocker date and time   Airway Mallampati: I  TM Distance: >3 FB Neck ROM: Full    Dental no notable dental hx.    Pulmonary asthma (excercise induced) , former smoker,    Pulmonary exam normal        Cardiovascular negative cardio ROS Normal cardiovascular exam     Neuro/Psych  Headaches, negative psych ROS   GI/Hepatic negative GI ROS, Neg liver ROS,   Endo/Other  negative endocrine ROS  Renal/GU negative Renal ROS     Musculoskeletal negative musculoskeletal ROS (+)   Abdominal   Peds negative pediatric ROS (+)  Hematology negative hematology ROS (+)   Anesthesia Other Findings   Reproductive/Obstetrics                             Anesthesia Physical Anesthesia Plan  ASA: II  Anesthesia Plan: General   Post-op Pain Management:    Induction: Intravenous  Airway Management Planned: Oral ETT  Additional Equipment:   Intra-op Plan:   Post-operative Plan:   Informed Consent: I have reviewed the patients History and Physical, chart, labs and discussed the procedure including the risks, benefits and alternatives for the proposed anesthesia with the patient or authorized representative who has indicated his/her understanding and acceptance.     Plan Discussed with: CRNA  Anesthesia Plan Comments:         Anesthesia Quick Evaluation

## 2015-08-30 NOTE — Transfer of Care (Signed)
Immediate Anesthesia Transfer of Care Note  Patient: Dominic Barnes  Procedure(s) Performed: Procedure(s) with comments: IMAGE GUIDED SINUS SURGERY (N/A) - GAVE DISK TO [name] 11/3 MAXILLARY ANTROSTOMY (Bilateral) SEPTOPLASTY (N/A) INFERIOR TURBINATE REDUCTION  (Bilateral)  Patient Location: PACU  Anesthesia Type: General  Level of Consciousness: awake, alert  and patient cooperative  Airway and Oxygen Therapy: Patient Spontanous Breathing and Patient connected to supplemental oxygen  Post-op Assessment: Post-op Vital signs reviewed, Patient's Cardiovascular Status Stable, Respiratory Function Stable, Patent Airway and No signs of Nausea or vomiting  Post-op Vital Signs: Reviewed and stable  Complications: No apparent anesthesia complications

## 2015-08-30 NOTE — Op Note (Signed)
..08/30/2015  12:59 PM    Judeth Hornobert G Howry  [id number]    Pre-Op Dx:  Deviated Nasal Septum, Hypertrophic Inferior Turbinates  Post-op Dx: Same  Proc:   1) Nasal Septoplasty  2) Bilateral Partial Reduction Inferior Turbinates  3) Bilateral Balloon Maxillary Sinuplasty   Surg:  Michaeljoseph Revolorio  Anes:  GOT  EBL:  100  Comp:  none  Findings: Severe cartilagenous and bone deviation on left with impaction onto lateral nasal sidewall as well as inferior turbinate.  Bilateral inferior turbinate hypertrophy, Bilateral narrow OMC successfully dilated.  Procedure: With the patient in a comfortable supine position,  general orotracheal anesthesia was induced without difficulty.  The patient received preoperative Afrin spray for topical decongestion and vasoconstriction.  At an appropriate level, the patient was placed in a semi-sitting position.  Nasal vibrissae were trimmed.   1% Xylocaine with 1:100,000 epinephrine, 5 cc's, was infiltrated into the anterior floor of the nose, into the nasal spine region, into the membranous columella, and finally into the submucoperichondrial plane of the septum on both sides.  Several minutes were allowed for this to take effect.  Cottoniod pledgetts soaked in Afrin were placed into both nasal cavities and left while the patient was prepped and draped in the standard fashion.   A proper time-out was performed.  The Stryker image guidance system was set up and calibrated in the normal fashion with an acceptable error of 0.5 mm.   The materials were removed from the nose and observed to be intact and correct in number.  The nose was inspected with a headlight and zero degree endoscope with the findings as described above.  A left Killian incision was sharply executed and carried down to the caudal edge of the quadrangular cartilage with a 15 blade scapel.  A mucoperichondrial flap was elelvated along the quadrangular plate back to the bony-cartilaginous  junction using caudal elevator and freer elevator. The mucoperiostium was then elevated along the ethmoid plate and the vomer. An itracartilagenous incision was made using the freer elevator and a contralateral mucoperichondiral flap was elevated using a freer elevator.  Care was taken to avoid any large rents or opposing rents in the mucoperichondrial flap.  Boney spurs of the vomer and maxillary crest were removed with Takahashi forceps.  The area of cartilagenous deviation was removed with combination of freer elevator and Takahashi forceps creating a widely patent nasal cavity as well as resolution of obstruction from the cartilagenous deviation. The mucosal flaps were placed back into their anatomic position to allow visualization of the airways. The septum now sat in the midline with an improved airway.  A 4-0 Chromic was used to close the ViciKillian incision as well.   The inferior turbinates were then inspected.  Under endoscopic visualization, the inferior turbinates were infractured bilaterally with a Therapist, nutritionalreer elevator.  A kelly clamp was attached to the anterior-inferior third of each inferior turbinate for approximately one minute.  Under endoscopic visualization, Tru-cutting forceps were used to remove the anterior-inferior third of each inferior turbinate.  Electrocautery was used to control bleeding in the area. The remaining turbinate was then outfractured to open up the airway further. There was no significant bleeding noted. The right turbinate was then trimmed and outfractured in a similar fashion.  The airways were then visualized and showed open passageways on both sides that were significantly improved compared to before surgery.       At this point, attention was directed to the functional endoscopic sinus surgery aspect of  the procedure.  The nose was next inspected with a zero degree endoscope and the middle turbinates were medialized and afrin soaked pledgets were placed lateral to the  turbinates for approximately one minute.  The uncinate process was infractured with a maxillary ostia seeker.  The Acclarent balloon sinuplasty device was next placed behind the uncinate process and the guide wire was threaded into the left maxillary sinus with proper transillumination.  The sinus tract was then dilated 3X for a patent maxillary ostia and OMC.  This was repeated in an identical fashion on the patient's right side for a widely patent maxillary ostia on the right.   At this time with all blocked sinuses opened, the patient's nasal cavity was examinated and copiously irrigated with sterile saline.  Meticulous hemostasis was continued and all sinuses were examined and noted to be widely patent.    There was no signifcant bleeding. Nasal splints were applied to both sides of the septum using Xomed 0.35mm regular sized splints that were trimmed, and then held in position with a 3-0 Nylon through and through suture.  Stamberger sinufoam was placed along the cut edge of the inferior turbinates bilaterally as well as lateral to the middle turbinate.  The patient was turned back over to anesthesia, and awakened, extubated, and taken to the PACU in satisfactory condition.  Dispo:   PACU to home  Plan: Ice, elevation, narcotic analgesia, steroid taper, and prophylactic antibiotics for the duration of indwelling nasal foreign bodies.  We will reevaluate the patient in the office in 6 days and remove the septal splints.  Return to work in 10 days, strenuous activities in two weeks.   Voncille Simm 08/30/2015 12:59 PM

## 2015-08-31 ENCOUNTER — Encounter: Payer: Self-pay | Admitting: Otolaryngology

## 2015-11-08 ENCOUNTER — Encounter: Payer: Self-pay | Admitting: Family Medicine

## 2015-11-08 ENCOUNTER — Ambulatory Visit (INDEPENDENT_AMBULATORY_CARE_PROVIDER_SITE_OTHER): Payer: Self-pay | Admitting: Family Medicine

## 2015-11-08 VITALS — BP 124/78 | HR 62 | Resp 16 | Ht 72.0 in | Wt 166.4 lb

## 2015-11-08 DIAGNOSIS — J349 Unspecified disorder of nose and nasal sinuses: Secondary | ICD-10-CM | POA: Insufficient documentation

## 2015-11-08 DIAGNOSIS — R208 Other disturbances of skin sensation: Secondary | ICD-10-CM

## 2015-11-08 MED ORDER — GABAPENTIN 100 MG PO CAPS: ORAL_CAPSULE | ORAL | Status: DC

## 2015-11-08 NOTE — Progress Notes (Signed)
Name: Dominic Barnes   MRN: [id number]    DOB: Apr 05, 1991   Date:11/08/2015       Progress Note  Subjective  Chief Complaint  Chief Complaint  Patient presents with  . Rash    Needs referrla     HPI Scrotodena.?  Dermatology found no abnormality even with skin biopsy.  C/o pain externally with any activity.  Scrotum always feels warm and burning sensation.  Skin appears sl. Red.    Warm shower helps relieve pain.  He would like to be referred to Urology .  No problem-specific assessment & plan notes found for this encounter.   Past Medical History  Diagnosis Date  . Asthma     exercise induced. 1 episiode. Approx age 25.  [name] Headache     sinus  . 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 Smoking status: Former Smoker    Quit date: 07/21/2012  . Smokeless tobacco: Not on file  . Alcohol Use: 1.2 oz/week    2 Cans of beer per week     Current outpatient prescriptions:  .  Azelastine-Fluticasone 137-50 MCG/ACT SUSP, Place into the nose 2 (two) times daily., Disp: , Rfl:  .  fluticasone (FLONASE) 50 MCG/ACT nasal spray, Place into both nostrils daily., Disp: , Rfl:  .  gabapentin (NEURONTIN) 100 MG capsule, Take 3 capsules three times a day, Disp: 270 capsule, Rfl: 6  Not on File  Review of Systems  Constitutional: Negative for fever, chills, weight loss and malaise/fatigue.  HENT: Negative for hearing loss.   Eyes: Negative for blurred vision and double vision.  Respiratory: Negative for cough, shortness of breath and wheezing.   Cardiovascular: Negative for chest pain, palpitations and leg swelling.  Gastrointestinal: Negative for heartburn, abdominal pain and blood in stool.  Genitourinary: Negative for dysuria, urgency and frequency.       Pain in scrotum with burning sensation.  Skin: Negative for rash.  Neurological: Negative for weakness and headaches.      Objective  Filed Vitals:   11/08/15 1305  BP: 124/78   Pulse: 62  Resp: 16  Height: 6' (1.829 m)  Weight: 166 lb 6.4 oz (75.479 kg)     Physical Exam  Constitutional: He is well-developed, well-nourished, and in no distress. No distress.  HENT:  Head: Normocephalic and atraumatic.  Eyes: Conjunctivae and EOM are normal. Pupils are equal, round, and reactive to light. No scleral icterus.  Neck: Normal range of motion. Neck supple. Carotid bruit is not present. No thyromegaly present.  Cardiovascular: Normal rate, regular rhythm and normal heart sounds.  Exam reveals no gallop and no friction rub.   No murmur heard. Pulmonary/Chest: Effort normal and breath sounds normal. No respiratory distress. He has no wheezes. He has no rales.  Lymphadenopathy:    He has no cervical adenopathy.  Skin:  Scrotum with very mild erythema.  No skin lesions.  Nop increased sensitivity to skin.  Vitals reviewed.     No results found for this or any previous visit (from the past 2160 hour(s)).   Assessment & Plan  1. Dysesthesia  - gabapentin (NEURONTIN) 100 MG capsule; Take 3 capsules three times a day  Dispense: 270 capsule; Refill: 6

## 2015-11-24 ENCOUNTER — Ambulatory Visit
Admission: EM | Admit: 2015-11-24 | Discharge: 2015-11-24 | Disposition: A | Discharge status: Home / Self Care | Payer: Managed Care, Other (non HMO) | Attending: Family Medicine | Admitting: Family Medicine

## 2015-11-24 DIAGNOSIS — F419 Anxiety disorder, unspecified: Secondary | ICD-10-CM | POA: Diagnosis not present

## 2015-11-24 DIAGNOSIS — N5082 Scrotal pain: Secondary | ICD-10-CM | POA: Diagnosis not present

## 2015-11-24 MED ORDER — LORAZEPAM 1 MG PO TABS
1.0000 mg | ORAL_TABLET | Freq: Once | ORAL | Status: AC
  Administered 2015-11-24: 1 mg via ORAL

## 2015-11-24 MED ORDER — KETOROLAC TROMETHAMINE 60 MG/2ML IM SOLN
60.0000 mg | Freq: Once | INTRAMUSCULAR | Status: AC
  Administered 2015-11-24: 60 mg via INTRAMUSCULAR

## 2015-11-24 NOTE — ED Notes (Signed)
Hx of scrotal swelling/redness x 2 years off and on. Seen Dermatology and had Bx done, PMD. Suggesting scrotadinia or nerve pain.  Started again yesterday with severe pain posterior scrotum, red and "grippy" feeling

## 2015-11-24 NOTE — ED Provider Notes (Signed)
CSN: [id number]     Arrival date & time 11/24/15  1143 History   First MD Initiated Contact with Patient 11/24/15 1245     Chief Complaint  Patient presents with  . Testicle Pain   (Consider location/radiation/quality/duration/timing/severity/associated sxs/prior Treatment) HPI Comments: 25 yo male with a 2 days h/o flare up of scrotal pain which he's had intermittently for the past 2 years. States has seen his PCP and specialist for this and unknown cause/etiology. Patient states in the past had had flare ups of this pain when he's been under certain situational stress and anxiety. Patient states this week has had stress and anxiety regarding a new job. Denies any injury, trauma, dysuria, penile discharge, fevers, chills.   The history is provided by the patient.    Past Medical History  Diagnosis Date  . Asthma     exercise induced. 1 episiode. Approx age 42.  [name] Headache     sinus  . 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 Tonsillectomy    . Wisdom tooth extraction    . Elbow surgery      right  . Image guided sinus surgery N/A 08/30/2015    Procedure: IMAGE GUIDED SINUS SURGERY;  Surgeon: Bud Face, MD;  Location: [facility];  Service: ENT;  Laterality: N/A;  GAVE DISK TO [name] 11/3  . Maxillary antrostomy Bilateral 08/30/2015    Procedure: MAXILLARY ANTROSTOMY;  Surgeon: Bud Face, MD;  Location: [facility];  Service: ENT;  Laterality: Bilateral;  . Septoplasty N/A 08/30/2015    Procedure: SEPTOPLASTY;  Surgeon: Bud Face, MD;  Location: [facility];  Service: ENT;  Laterality: N/A;  . Turbinate reduction Bilateral 08/30/2015    Procedure: INFERIOR TURBINATE REDUCTION ;  Surgeon: Bud Face, MD;  Location: [facility];  Service: ENT;  Laterality: Bilateral;  . Nasal sinus surgery     Family History  Problem Relation Age of Onset  . Cirrhosis Father    Social  History  Substance Use Topics  . Smoking status: Former Smoker    Quit date: 07/21/2012  . Smokeless tobacco: None  . Alcohol Use: 1.2 oz/week    2 Cans of beer per week     Comment: socially    Review of Systems  Allergies  Review of patient's allergies indicates no known allergies.  Home Medications   Prior to Admission medications   Medication Sig Start Date End Date Taking? Authorizing Provider  Azelastine-Fluticasone 137-50 MCG/ACT SUSP Place into the nose 2 (two) times daily.   Yes Historical Provider, MD  doxycycline (VIBRAMYCIN) 100 MG capsule Take 100 mg by mouth 2 (two) times daily.   Yes Historical Provider, MD  gabapentin (NEURONTIN) 100 MG capsule Take 3 capsules three times a day 11/08/15  Yes Janeann Forehand., MD  fluticasone [facility]) 50 MCG/ACT nasal spray Place into both nostrils daily.    Historical Provider, MD   Meds Ordered and Administered this Visit   Medications  ketorolac (TORADOL) injection 60 mg (60 mg Intramuscular Given 11/24/15 1303)  LORazepam (ATIVAN) tablet 1 mg (1 mg Oral Given 11/24/15 1322)    BP 142/92 mmHg  Pulse 64  Temp(Src) 96.2 F (35.7 C) (Tympanic)  Resp 16  Ht 6' (1.829 m)  Wt 165 lb (74.844 kg)  BMI 22.37 kg/m2  SpO2 100% No data found.   Physical Exam  Constitutional: He appears well-developed and well-nourished. No distress.  Abdominal: Hernia confirmed negative in the right inguinal area and confirmed negative in the left inguinal area.  Genitourinary: Testes normal and penis normal. Cremasteric reflex is present. Right testis shows no mass, no swelling and no tenderness. Right testis is descended. Cremasteric reflex is not absent on the right side. Left testis shows no mass, no swelling and no tenderness. Left testis is descended. Cremasteric reflex is not absent on the left side. No penile erythema or penile tenderness. No discharge found.  Lymphadenopathy:       Right: No inguinal adenopathy present.       Left: No  inguinal adenopathy present.  Skin: He is not diaphoretic.  Nursing note and vitals reviewed.   ED Course  Procedures (including critical care time)  Labs Review Labs Reviewed - No data to display  Imaging Review No results found.   Visual Acuity Review  Right Eye Distance:   Left Eye Distance:   Bilateral Distance:    Right Eye Near:   Left Eye Near:    Bilateral Near:         MDM   1. Scrotal pain   2. Anxiety    1. diagnosis reviewed with patient 2. Patient given Toradol  IM x1 and ativan  po x 1 with improvement of symptoms 3. Recommend f/u with PCP/specialist 4. F/u here prn  Payton Mccallum, MD 11/24/15 2139

## 2015-11-27 ENCOUNTER — Other Ambulatory Visit: Payer: Self-pay

## 2015-11-27 ENCOUNTER — Telehealth: Payer: Self-pay | Admitting: Family Medicine

## 2015-11-27 DIAGNOSIS — N50819 Testicular pain, unspecified: Secondary | ICD-10-CM

## 2015-11-27 NOTE — Telephone Encounter (Signed)
Pt called states he need a referral today for Urological pt states that he was in Urgent care Friday and was told by staff that we need to do a referral (TODAY). Pt. Call back # [phone]

## 2015-11-27 NOTE — Telephone Encounter (Signed)
Is this ref ok? He needs this Today if possible. Went to Urgent Care but they need [place] to ref to Urology. He is in Pain severe.

## 2015-11-27 NOTE — Telephone Encounter (Signed)
Ok for referral to [facility] ASAP.-jh

## 2015-11-28 ENCOUNTER — Ambulatory Visit (INDEPENDENT_AMBULATORY_CARE_PROVIDER_SITE_OTHER): Payer: Managed Care, Other (non HMO) | Admitting: Urology

## 2015-11-28 ENCOUNTER — Encounter: Payer: Self-pay | Admitting: Urology

## 2015-11-28 VITALS — BP 135/82 | HR 60 | Ht 72.0 in | Wt 162.7 lb

## 2015-11-28 DIAGNOSIS — N5082 Scrotal pain: Secondary | ICD-10-CM

## 2015-11-28 DIAGNOSIS — N5312 Painful ejaculation: Secondary | ICD-10-CM | POA: Insufficient documentation

## 2015-11-28 DIAGNOSIS — F419 Anxiety disorder, unspecified: Secondary | ICD-10-CM | POA: Insufficient documentation

## 2015-11-28 LAB — MICROSCOPIC EXAMINATION: EPITHELIAL CELLS (NON RENAL): NONE SEEN /HPF (ref 0–10)

## 2015-11-28 LAB — URINALYSIS, COMPLETE
BILIRUBIN UA: NEGATIVE
Glucose, UA: NEGATIVE
Leukocytes, UA: NEGATIVE
NITRITE UA: NEGATIVE
PH UA: 6 (ref 5.0–7.5)
Protein, UA: NEGATIVE
RBC UA: NEGATIVE
Specific Gravity, UA: 1.02 (ref 1.005–1.030)
UUROB: 1 mg/dL (ref 0.2–1.0)

## 2015-11-28 LAB — BLADDER SCAN AMB NON-IMAGING: SCAN RESULT: 0

## 2015-11-28 NOTE — Progress Notes (Signed)
11/28/2015 10:20 AM   Dominic Barnes 1991-08-29 [id number]  Referring provider: Janeann Forehand., MD [address] [place], [place] [address]  Chief Complaint  Patient presents with  . Testicle Pain    referred by Dr. Juanetta Gosling    HPI: Patient is a 25 year old Caucasian male who has had a two-year history of intermittent scrotal pain.  Patient stated he noticed it started after he graduated from college. When he was engaged in physically demanding activities such as sports or his job he would notice that his scrotum would become bright red and cystic to his thighs and his testicles were drawn up.  He states this phenomenon was very painful. He then stopped working out and started to wear loose clothing. This did not provide relief.  He was then placed on mood inhibitors by his primary care physician and this did not provide relief of his scrotal issues.  He was then seen by dermatologist, who performed a scrotal skin biopsy and he was tested for STI's, all tests were negative.  He then has been placed on a cyclical prescription of doxycycline and gabapentin which has not provided relief.  He did receive 1 dose of Ativan in the urgent care, he did find relief of his scrotal issues with the Ativan.    Patient is very anxious concerning his scrotal issues.   He is not sexually active due to the embarrassment he might experience with his scrotum becoming red. He states he is finding difficult to do his job because the scrotal skin is red and painful.  He has not had a penile discharge or any other urinary symptoms. He is not had fevers, chills, nausea or vomiting.   PMH: Past Medical History  Diagnosis Date  . Asthma     exercise induced. 1 episiode. Approx age 88.  [name] Headache     sinus  . 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 Sinus trouble   . Testicle pain   . Anxiety     Surgical History: Past Surgical History  Procedure Laterality Date  . Tonsillectomy    . Wisdom tooth  extraction    . Elbow surgery      right  . Image guided sinus surgery N/A 08/30/2015    Procedure: IMAGE GUIDED SINUS SURGERY;  Surgeon: Bud Face, MD;  Location: [facility];  Service: ENT;  Laterality: N/A;  GAVE DISK TO [name] 11/3  . Maxillary antrostomy Bilateral 08/30/2015    Procedure: MAXILLARY ANTROSTOMY;  Surgeon: Bud Face, MD;  Location: [facility];  Service: ENT;  Laterality: Bilateral;  . Septoplasty N/A 08/30/2015    Procedure: SEPTOPLASTY;  Surgeon: Bud Face, MD;  Location: [facility];  Service: ENT;  Laterality: N/A;  . Turbinate reduction Bilateral 08/30/2015    Procedure: INFERIOR TURBINATE REDUCTION ;  Surgeon: Bud Face, MD;  Location: [facility];  Service: ENT;  Laterality: Bilateral;  . Nasal sinus surgery      Home Medications:    Medication List       This list is accurate as of: 11/28/15 10:20 AM.  Always use your most recent med list.               Azelastine-Fluticasone 137-50 MCG/ACT Susp  Place into the nose 2 (two) times daily. Reported on 11/28/2015     doxycycline 100 MG capsule  Commonly known as:  VIBRAMYCIN  Take 100 mg by mouth 2 (two) times  daily.     fluticasone 50 MCG/ACT nasal spray  Commonly known as:  FLONASE  Place into both nostrils daily. Reported on 11/28/2015     gabapentin 100 MG capsule  Commonly known as:  NEURONTIN  Take 3 capsules three times a day        Allergies: No Known Allergies  Family History: Family History  Problem Relation Age of Onset  . Cirrhosis Father   . Kidney disease Neg Hx   . Prostate cancer Neg Hx     Social History:  reports that he quit smoking about 3 years ago. He does not have any smokeless tobacco history on file. He reports that he drinks about 1.2 oz of alcohol per week. He reports that he does not use illicit drugs.  ROS: UROLOGY Frequent Urination?: No Hard to postpone urination?: No Burning/pain with urination?: No Get up  at night to urinate?: No Leakage of urine?: No Urine stream starts and stops?: No Trouble starting stream?: No Do you have to strain to urinate?: No Blood in urine?: No Urinary tract infection?: No Sexually transmitted disease?: No Injury to kidneys or bladder?: No Painful intercourse?: Yes Weak stream?: No Erection problems?: Yes Penile pain?: Yes  Gastrointestinal Nausea?: No Vomiting?: No Indigestion/heartburn?: No Diarrhea?: No Constipation?: No  Constitutional Fever: No Night sweats?: No Weight loss?: No Fatigue?: No  Skin Skin rash/lesions?: No Itching?: No  Eyes Blurred vision?: No Double vision?: No  Ears/Nose/Throat Sore throat?: No Sinus problems?: No  Hematologic/Lymphatic Swollen glands?: No Easy bruising?: No  Cardiovascular Leg swelling?: No Chest pain?: No  Respiratory Cough?: No Shortness of breath?: No  Endocrine Excessive thirst?: No  Musculoskeletal Back pain?: No Joint pain?: No  Neurological Headaches?: No Dizziness?: No  Psychologic Depression?: No Anxiety?: Yes  Physical Exam: BP 135/82 mmHg  Pulse 60  Ht 6' (1.829 m)  Wt 162 lb 11.2 oz (73.8 kg)  BMI 22.06 kg/m2  Constitutional: Well nourished. Alert and oriented, No acute distress. HEENT: [place] AT, moist mucus membranes. Trachea midline, no masses. Cardiovascular: No clubbing, cyanosis, or edema. Respiratory: Normal respiratory effort, no increased work of breathing. GI: Abdomen is soft, non tender, non distended, no abdominal masses. Liver and spleen not palpable.  No hernias appreciated.  Stool sample for occult testing is not indicated.   GU: No CVA tenderness.  No bladder fullness or masses.  Patient with circumcised phallus.   Urethral meatus is patent.  No penile discharge. No penile lesions or rashes. Scrotum without lesions, cysts, rashes and/or edema.  Testicles are located scrotally bilaterally. No masses are appreciated in the testicles. Left and right  epididymis are normal. Skin: No rashes, bruises or suspicious lesions. Lymph: No cervical or inguinal adenopathy. Neurologic: Grossly intact, no focal deficits, moving all 4 extremities. Psychiatric: Normal mood and affect.  Laboratory Data:  Urinalysis Results for orders placed or performed in visit on 11/28/15  Microscopic Examination  Result Value Ref Range   WBC, UA 0-5 0 -  5 /hpf   RBC, UA 0-2 0 -  2 /hpf   Epithelial Cells (non renal) None seen 0 - 10 /hpf   Mucus, UA Present (A) Not Estab.   Bacteria, UA Few None seen/Few  Urinalysis, Complete  Result Value Ref Range   Specific Gravity, UA 1.020 1.005 - 1.030   pH, UA 6.0 5.0 - 7.5   Color, UA Yellow Yellow   Appearance Ur Clear Clear   Leukocytes, UA Negative Negative   Protein, UA Negative  Negative/Trace   Glucose, UA Negative Negative   Ketones, UA Trace (A) Negative   RBC, UA Negative Negative   Bilirubin, UA Negative Negative   Urobilinogen, Ur 1.0 0.2 - 1.0 mg/dL   Nitrite, UA Negative Negative   Microscopic Examination See below:     Pertinent Imaging: Results for SAVAS, ELVIN (MRN [id number]) as of 11/28/2015 10:13  Ref. Range 11/28/2015 09:44  Scan Result Unknown 0    Assessment & Plan:    1. Scrotal pain:   Since patient states that this scrotal erythema and pain is precipitated by physical activity, I have asked that he perform strenuous physical activity prior to obtaining a scrotal ultrasound.  I would like a scrotal ultrasound performed during a pain episode.    - Urinalysis, Complete - CULTURE, URINE COMPREHENSIVE - GC/Chlamydia Probe Amp - BLADDER SCAN AMB NON-IMAGING  2. Anxiety:   I will make a referral for the patient to psychiatry.  He is a very anxious individual. He did find some mild release with the Ativan given to him by urgent care.  I do not feel comfortable prescribing benzodiazepines for this condition, so I will refer him to psychiatry.   Return in about 1 week (around  12/05/2015) for scrotal ultrasound report.  These notes generated with voice recognition software. I apologize for typographical errors.  Michiel Cowboy, PA-C  [facility] [address] [place], [place] [address] [phone]

## 2015-11-29 LAB — GC/CHLAMYDIA PROBE AMP
CHLAMYDIA, DNA PROBE: NEGATIVE
Neisseria gonorrhoeae by PCR: NEGATIVE

## 2015-11-30 ENCOUNTER — Telehealth: Payer: Self-pay

## 2015-11-30 LAB — CULTURE, URINE COMPREHENSIVE

## 2015-11-30 NOTE — Telephone Encounter (Signed)
-----   Message from Harle Battiest, PA-C sent at 11/29/2015 10:35 PM EST ----- Patient's GC/Chlamydia cultures are negative.

## 2015-11-30 NOTE — Telephone Encounter (Signed)
Spoke with pt in reference to -ucx. Pt voiced understanding.  

## 2015-11-30 NOTE — Telephone Encounter (Signed)
Spoke with pt in reference to lab results. Pt voiced understanding.  

## 2015-11-30 NOTE — Telephone Encounter (Signed)
-----   Message from Harle Battiest, PA-C sent at 11/30/2015  2:57 PM EST ----- Urine culture is negative.

## 2015-11-30 NOTE — Telephone Encounter (Signed)
No answer

## 2015-12-12 ENCOUNTER — Ambulatory Visit: Payer: Self-pay | Admitting: Psychiatry

## 2015-12-14 ENCOUNTER — Ambulatory Visit: Payer: Self-pay | Admitting: Urology

## 2015-12-21 ENCOUNTER — Ambulatory Visit: Payer: Managed Care, Other (non HMO) | Admitting: Family Medicine

## 2015-12-22 ENCOUNTER — Telehealth: Payer: Self-pay | Admitting: Urology

## 2015-12-22 NOTE — Telephone Encounter (Signed)
Patient called to let you know that he had to cx the Ultrasound that you ordered due to the cost. Plus he said that he was not expecting to have two different Ultrasounds done. And that he just can not afford to do this at this time. He is going to keep the appt at the psychiatrist to discuss the other issues that he has been having. He would like for you to call him when you get back to discuss some things.   Thanks,  Dominic DusterMichelle

## 2015-12-25 ENCOUNTER — Ambulatory Visit: Payer: BLUE CROSS/BLUE SHIELD | Attending: Urology

## 2016-01-01 ENCOUNTER — Ambulatory Visit: Payer: Self-pay | Admitting: Urology

## 2016-01-01 NOTE — Telephone Encounter (Signed)
Patient is frustrated that he cannot have the scrotal ultrasound during the pain.  He does not want to have to induce the pain to have the ultrasound.  He will contact the office when he has the pain and we will schedule a STAT scrotal ultrasound.  We need to rule out an intermittent torsion which is a surgical emergency.

## 2016-01-03 ENCOUNTER — Ambulatory Visit: Payer: Self-pay | Admitting: Psychiatry

## 2016-01-15 ENCOUNTER — Ambulatory Visit: Payer: Self-pay

## 2016-01-17 ENCOUNTER — Ambulatory Visit: Payer: Self-pay | Admitting: Urology

## 2016-01-29 ENCOUNTER — Ambulatory Visit: Payer: Self-pay | Admitting: Psychiatry

## 2016-07-05 ENCOUNTER — Ambulatory Visit
Admission: RE | Admit: 2016-07-05 | Discharge: 2016-07-05 | Disposition: A | Discharge status: Home / Self Care | Payer: BLUE CROSS/BLUE SHIELD | Source: Ambulatory Visit | Attending: Urology | Admitting: Urology

## 2016-07-05 DIAGNOSIS — N5082 Scrotal pain: Secondary | ICD-10-CM | POA: Diagnosis present

## 2016-07-05 DIAGNOSIS — N503 Cyst of epididymis: Secondary | ICD-10-CM | POA: Diagnosis not present

## 2016-07-05 DIAGNOSIS — N433 Hydrocele, unspecified: Secondary | ICD-10-CM | POA: Diagnosis not present

## 2016-07-08 ENCOUNTER — Telehealth: Payer: Self-pay

## 2016-07-08 NOTE — Telephone Encounter (Signed)
Spoke with pt in reference to scrotal u/s results. Pt stated that he is not having pain anymore. Reinforced with pt if pain returns to call. Pt voiced understanding.

## 2016-07-08 NOTE — Telephone Encounter (Signed)
-----   Message from Honor Loharrie M Garrison, [place] sent at 07/08/2016  1:41 PM EDT -----   ----- Message ----- From: Harle BattiestShannon A McGowan, PA-C Sent: 07/05/2016   3:40 PM To: Honor Loharrie M Garrison, CMA  Please notify the patient that his scrotal ultrasound was basically benign.  If he is still having scrotal pain, I suggest he have a office visit with Dr. Ronne BinningMcKenzie.

## 2016-07-11 ENCOUNTER — Ambulatory Visit (INDEPENDENT_AMBULATORY_CARE_PROVIDER_SITE_OTHER): Payer: BLUE CROSS/BLUE SHIELD | Admitting: Urology

## 2016-07-11 ENCOUNTER — Encounter: Payer: Self-pay | Admitting: Urology

## 2016-07-11 VITALS — BP 118/66 | HR 82 | Ht 72.0 in | Wt 155.8 lb

## 2016-07-11 DIAGNOSIS — N433 Hydrocele, unspecified: Secondary | ICD-10-CM

## 2016-07-11 DIAGNOSIS — N5082 Scrotal pain: Secondary | ICD-10-CM | POA: Diagnosis not present

## 2016-07-11 DIAGNOSIS — N503 Cyst of epididymis: Secondary | ICD-10-CM | POA: Diagnosis not present

## 2016-07-11 NOTE — Progress Notes (Signed)
07/11/2016 9:23 PM   Knute Neu [facility] 1991-08-23 [id number]  Referring provider: Janeann Forehand., MD [address] [place], [place] [address]  Chief Complaint  Patient presents with  . Results    [place]    HPI: Patient is a 25 year old Caucasian male who presents today for scrotal ultrasound report. Scrotal ultrasound is ordered for chronic intermittent scrotal pain.  Background history Patient has had a two-year history of intermittent scrotal pain.  Patient stated he noticed it started after he graduated from college. When he was engaged in physically demanding activities,such as sports or his job, he would notice that his scrotum would become bright red and stick to his thighs and his testicles were drawn up.  He stated this phenomenon was very painful. He then stopped working out and started to wear loose clothing. This did not provide relief.  He was then placed on mood inhibitors by his primary care physician and this did not provide relief of his scrotal issues.  He was then seen by dermatologist, who performed a scrotal skin biopsy and he was tested for STI's, all tests were negative.  He then has been placed on a cyclical prescription of doxycycline and gabapentin which has not provided relief.  He did receive 1 dose of Ativan in the urgent care, he did find relief of his scrotal issues with the Ativan.  Patient was very anxious concerning his scrotal issues.   He was not sexually active due to the embarrassment he might experience with his scrotum becoming red.  He stated he was finding it difficult to do his job because the scrotal skin was red and painful.  He had not had a penile discharge or any other urinary symptoms. He has not had fevers, chills, nausea or vomiting.  Today, he states the scrotal pain has improved significantly.  He has started to wear compression underwear and he finds this helpful in controlling his scrotal pain.  Not noted any scrotal swelling or bulge in the  inguinal area.  Is not experiencing any urinary symptoms or penile discharge.  He has not had fevers, chills, nausea or vomiting.  Scrotal ultrasound performed on 11/28/2015 noted small bilateral hydroceles, a small left epididymal cyst and no evidence of testicular mass or torsion. This information was shared with the patient. We independently reviewed the ultrasound together.   PMH: Past Medical History:  Diagnosis Date  . Anxiety   . 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 Asthma    exercise induced. 1 episiode. Approx age 65.  [name] Headache    sinus  . Sinus trouble   . Testicle pain     Surgical History: Past Surgical History:  Procedure Laterality Date  . ELBOW SURGERY     right  . IMAGE GUIDED SINUS SURGERY N/A 08/30/2015   Procedure: IMAGE GUIDED SINUS SURGERY;  Surgeon: Bud Face, MD;  Location: [facility];  Service: ENT;  Laterality: N/A;  GAVE DISK TO [name] 11/3  . MAXILLARY ANTROSTOMY Bilateral 08/30/2015   Procedure: MAXILLARY ANTROSTOMY;  Surgeon: Bud Face, MD;  Location: [facility];  Service: ENT;  Laterality: Bilateral;  . NASAL SINUS SURGERY    . SEPTOPLASTY N/A 08/30/2015   Procedure: SEPTOPLASTY;  Surgeon: Bud Face, MD;  Location: [facility];  Service: ENT;  Laterality: N/A;  . TONSILLECTOMY    . TURBINATE REDUCTION Bilateral 08/30/2015   Procedure: INFERIOR TURBINATE REDUCTION ;  Surgeon: Bud Face, MD;  Location:  [facility];  Service: ENT;  Laterality: Bilateral;  . WISDOM TOOTH EXTRACTION      Home Medications:    Medication List       Accurate as of 07/11/16 11:59 PM. Always use your most recent med list.          Azelastine-Fluticasone 137-50 MCG/ACT Susp Place into the nose 2 (two) times daily. Reported on 11/28/2015   doxycycline 100 MG capsule Commonly known as:  VIBRAMYCIN Take 100 mg by mouth 2 (two) times daily.   fluticasone 50 MCG/ACT nasal spray Commonly known as:   FLONASE Place into both nostrils daily. Reported on 11/28/2015   gabapentin 100 MG capsule Commonly known as:  NEURONTIN Take 3 capsules three times a day       Allergies: No Known Allergies  Family History: Family History  Problem Relation Age of Onset  . Cirrhosis Father   . Kidney disease Neg Hx   . Prostate cancer Neg Hx     Social History:  reports that he quit smoking about 3 years ago. He uses smokeless tobacco. He reports that he drinks about 1.2 oz of alcohol per week . He reports that he does not use drugs.  ROS: UROLOGY Frequent Urination?: No Hard to postpone urination?: No Burning/pain with urination?: No Get up at night to urinate?: No Leakage of urine?: No Urine stream starts and stops?: No Trouble starting stream?: No Do you have to strain to urinate?: No Blood in urine?: No Urinary tract infection?: No Sexually transmitted disease?: No Injury to kidneys or bladder?: No Painful intercourse?: No Weak stream?: No Erection problems?: No Penile pain?: No  Gastrointestinal Nausea?: No Vomiting?: No Indigestion/heartburn?: No Diarrhea?: No Constipation?: No  Constitutional Fever: No Night sweats?: No Weight loss?: No Fatigue?: No  Skin Skin rash/lesions?: No Itching?: No  Eyes Blurred vision?: No Double vision?: No  Ears/Nose/Throat Sore throat?: No Sinus problems?: No  Hematologic/Lymphatic Swollen glands?: No Easy bruising?: No  Cardiovascular Leg swelling?: No Chest pain?: No  Respiratory Cough?: No Shortness of breath?: No  Endocrine Excessive thirst?: No  Musculoskeletal Back pain?: No Joint pain?: No  Neurological Headaches?: No Dizziness?: No  Psychologic Depression?: No Anxiety?: No  Physical Exam: BP 118/66   Pulse 82   Ht 6' (1.829 m)   Wt 155 lb 12.8 oz (70.7 kg)   BMI 21.13 kg/m   Constitutional: Well nourished. Alert and oriented, No acute distress. HEENT: [place] AT, moist mucus membranes. Trachea  midline, no masses. Cardiovascular: No clubbing, cyanosis, or edema. Respiratory: Normal respiratory effort, no increased work of breathing. Skin: No rashes, bruises or suspicious lesions. Lymph: No cervical or inguinal adenopathy. Neurologic: Grossly intact, no focal deficits, moving all 4 extremities. Psychiatric: Normal mood and affect.   Pertinent Imaging: CLINICAL DATA:  Chronic bilateral scrotal pain.  EXAM: SCROTAL ULTRASOUND  DOPPLER ULTRASOUND OF THE TESTICLES  TECHNIQUE: Complete ultrasound examination of the testicles, epididymis, and other scrotal structures was performed. Color and spectral Doppler ultrasound were also utilized to evaluate blood flow to the testicles.  COMPARISON:  None.  FINDINGS: Right testicle  Measurements: 4.5 x 2.6 x 2.5 cm. No mass or microlithiasis visualized.  Left testicle  Measurements: 4.5 x 2.6 x 2.1 cm. No mass or microlithiasis visualized.  Right epididymis:  Normal in size and appearance.  Left epididymis:  3 mm cyst is noted.  Hydrocele:  Small bilateral hydroceles are noted.  Varicocele:  None visualized.  Pulsed Doppler interrogation of both testes demonstrates normal  low resistance arterial and venous waveforms bilaterally.  IMPRESSION: Small bilateral hydroceles. Small left epididymal cyst. No evidence of testicular mass or torsion.   Electronically Signed   By: Lupita RaiderJames  Green Jr, M.D.   On: 07/05/2016 15:28    Assessment & Plan:    1. Scrotal pain  - Resolving with the use of compression shortness during strenuous exercise  - Scrotal ultrasound found no malignancies or torsion  2. Bilateral hydroceles  - Patient to continue conservative management of hydroceles  -  Will contact the office of scrotum becomes larger or the pain worsens/increases  3. Epididymal cyst  - Reassured the patient that this is a benign finding   Return if symptoms worsen or fail to improve.  These notes  generated with voice recognition software. I apologize for typographical errors.  Michiel CowboySHANNON Ubaldo Daywalt, PA-C  [facility] [address], [address] [place], [place] [address] [phone]

## 2016-08-05 ENCOUNTER — Ambulatory Visit (INDEPENDENT_AMBULATORY_CARE_PROVIDER_SITE_OTHER): Payer: BLUE CROSS/BLUE SHIELD | Admitting: Family Medicine

## 2016-08-05 ENCOUNTER — Encounter: Payer: Self-pay | Admitting: Family Medicine

## 2016-08-05 DIAGNOSIS — F418 Other specified anxiety disorders: Secondary | ICD-10-CM | POA: Insufficient documentation

## 2016-08-05 MED ORDER — ESCITALOPRAM OXALATE 10 MG PO TABS: 10.0000 mg | ORAL_TABLET | Freq: Every day | ORAL | 3 refills | Status: DC

## 2016-08-05 MED ORDER — LORAZEPAM 1 MG PO TABS: ORAL_TABLET | ORAL | 0 refills | Status: DC

## 2016-08-05 NOTE — Progress Notes (Signed)
Name: Dominic Barnes   MRN: [id number]    DOB: 1991/04/08   Date:08/05/2016       Progress Note  Subjective  Chief Complaint  Chief Complaint  Patient presents with  . Follow-up  . Panic Attack    HPI Here c/o anxiety.  He has recurrent testicular pain that is worrying him.  He has had a normal Urol w/u.  He has dropped out of college.  He is working  And living at home.  He says that he is really nervous a lot.  Life is uncertain  No problem-specific Assessment & Plan notes found for this encounter.   Past Medical History:  Diagnosis Date  . Anxiety   . 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 Asthma    exercise induced. 1 episiode. Approx age 25.  [name]. Headache    sinus  . Sinus trouble   . Testicle pain     Past Surgical History:  Procedure Laterality Date  . ELBOW SURGERY     right  . IMAGE GUIDED SINUS SURGERY N/A 08/30/2015   Procedure: IMAGE GUIDED SINUS SURGERY;  Surgeon: Bud Facereighton Vaught, MD;  [facility];  Service: ENT;  Laterality: N/A;  GAVE DISK TO [name] 11/3  . MAXILLARY ANTROSTOMY Bilateral 08/30/2015   Procedure: MAXILLARY ANTROSTOMY;  Surgeon: Bud Facereighton Vaught, MD;  [facility];  Service: ENT;  Laterality: Bilateral;  . NASAL SINUS SURGERY    . SEPTOPLASTY N/A 08/30/2015   Procedure: SEPTOPLASTY;  Surgeon: Bud Facereighton Vaught, MD;  [facility];  Service: ENT;  Laterality: N/A;  . TONSILLECTOMY    . TURBINATE REDUCTION Bilateral 08/30/2015   Procedure: INFERIOR TURBINATE REDUCTION ;  Surgeon: Bud Facereighton Vaught, MD;  [facility];  Service: ENT;  Laterality: Bilateral;  . WISDOM TOOTH EXTRACTION      Family History  Problem Relation Age of Onset  . Cirrhosis Father   . Kidney disease Neg Hx   . Prostate cancer Neg Hx     Social History   Social History  . Marital status: Single    Spouse name: N/A  . Number of children: N/A  . Years of education: N/A   Occupational History  .  Not on file.   Social History Main Topics  . Smoking status: Former Smoker    Quit date: 07/21/2012  . Smokeless tobacco: Current User     Comment: quit 5 years /occasional dip  . Alcohol use 1.2 oz/week    2 Cans of beer per week     Comment: socially  . Drug use: No  . Sexual activity: Not on file   Other Topics Concern  . Not on file   Social History Narrative  . No narrative on file     Current Outpatient Prescriptions:  .  cetirizine-pseudoephedrine (ZYRTEC-D) 5-120 MG tablet, Take 1 tablet by mouth daily., Disp: , Rfl:  .  fluticasone (FLONASE) 50 MCG/ACT nasal spray, Place into both nostrils daily. Reported on 11/28/2015, Disp: , Rfl:  .  escitalopram (LEXAPRO) 10 MG tablet, Take 1 tablet (10 mg total) by mouth daily., Disp: 30 tablet, Rfl: 3 .  LORazepam (ATIVAN) 1 MG tablet, Take 1 tablet twice a day for anxiety for 1 week, then reduce to 1/2 tablet twice a day as needed, Disp: 28 tablet, Rfl: 0  Not on File   Review of Systems  Constitutional: Negative for chills, fever, malaise/fatigue and weight loss.  HENT: Negative for hearing  loss.   Eyes: Negative for blurred vision and double vision.  Respiratory: Negative for cough, shortness of breath and wheezing.   Cardiovascular: Negative for chest pain, palpitations and leg swelling.  Gastrointestinal: Negative for abdominal pain, blood in stool and heartburn.  Genitourinary: Negative for dysuria, frequency and urgency.  Musculoskeletal: Negative for joint pain and myalgias.  Skin: Negative for rash.  Neurological: Negative for dizziness, tremors, weakness and headaches.      Objective  Vitals:   08/05/16 1520  BP: 131/83  Pulse: 68  Resp: 16  Temp: 97.8 F (36.6 C)  TempSrc: Oral  Weight: 154 lb (69.9 kg)  Height: 6' (1.829 m)    Physical Exam  Constitutional: He is oriented to person, place, and time and well-developed, well-nourished, and in no distress. No distress.  HENT:  Head: Normocephalic and  atraumatic.  Eyes: Conjunctivae and EOM are normal. Pupils are equal, round, and reactive to light. No scleral icterus.  Neck: Normal range of motion. Neck supple. No thyromegaly present.  Cardiovascular: Normal rate, regular rhythm and normal heart sounds.  Exam reveals no gallop and no friction rub.   No murmur heard. Pulmonary/Chest: Effort normal and breath sounds normal. No respiratory distress. He has no wheezes. He has no rales.  Abdominal: Soft. Bowel sounds are normal. He exhibits no distension and no mass. There is no tenderness.  Musculoskeletal: He exhibits no edema.  Lymphadenopathy:    He has no cervical adenopathy.  Neurological: He is alert and oriented to person, place, and time.  Vitals reviewed.      No results found for this or any previous visit (from the past 2160 hour(s)).   Assessment & Plan  Problem List Items Addressed This Visit      Other   Depression with anxiety   Relevant Medications   escitalopram (LEXAPRO) 10 MG tablet   LORazepam (ATIVAN) 1 MG tablet    Other Visit Diagnoses   None.     Meds ordered this encounter  Medications  . cetirizine-pseudoephedrine (ZYRTEC-D) 5-120 MG tablet    Sig: Take 1 tablet by mouth daily.  [name] escitalopram (LEXAPRO) 10 MG tablet    Sig: Take 1 tablet (10 mg total) by mouth daily.    Dispense:  30 tablet    Refill:  3  . LORazepam (ATIVAN) 1 MG tablet    Sig: Take 1 tablet twice a day for anxiety for 1 week, then reduce to 1/2 tablet twice a day as needed    Dispense:  28 tablet    Refill:  0   1. Depression with anxiety  - escitalopram (LEXAPRO) 10 MG tablet; Take 1 tablet (10 mg total) by mouth daily.  Dispense: 30 tablet; Refill: 3 - LORazepam (ATIVAN) 1 MG tablet; Take 1 tablet twice a day for anxiety for 1 week, then reduce to 1/2 tablet twice a day as needed  Dispense: 28 tablet; Refill: 0

## 2016-09-10 ENCOUNTER — Ambulatory Visit (INDEPENDENT_AMBULATORY_CARE_PROVIDER_SITE_OTHER): Payer: BLUE CROSS/BLUE SHIELD | Admitting: Family Medicine

## 2016-09-10 ENCOUNTER — Encounter: Payer: Self-pay | Admitting: Family Medicine

## 2016-09-10 VITALS — BP 127/78 | HR 51 | Temp 97.9°F | Resp 16 | Ht 72.0 in | Wt 156.0 lb

## 2016-09-10 DIAGNOSIS — F418 Other specified anxiety disorders: Secondary | ICD-10-CM | POA: Diagnosis not present

## 2016-09-10 DIAGNOSIS — Z23 Encounter for immunization: Secondary | ICD-10-CM | POA: Diagnosis not present

## 2016-09-10 MED ORDER — ESCITALOPRAM OXALATE 20 MG PO TABS: 20.0000 mg | ORAL_TABLET | Freq: Every day | ORAL | 3 refills | Status: DC

## 2016-09-10 NOTE — Progress Notes (Signed)
Name: Dominic Barnes   MRN: [id number]    DOB: 23-Aug-1991   Date:09/10/2016       Progress Note  Subjective  Chief Complaint  Chief Complaint  Patient presents with  . Depression    HPI Here fo f/u of Depression and anxiety.  Has not noticed any improvement of depressive sx at this time.  Still getting anxious daily.  Still with testicular sensaitivity when he is anxious.  Finished Ativan.    No problem-specific Assessment & Plan notes found for this encounter.   Past Medical History:  Diagnosis Date  . Anxiety   . 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 Asthma    exercise induced. 1 episiode. Approx age 25.  [name]. Headache    sinus  . Sinus trouble   . Testicle pain     Social History  Substance Use Topics  . Smoking status: Former Smoker    Quit date: 07/21/2012  . Smokeless tobacco: Current User     Comment: quit 5 years /occasional dip  . Alcohol use 1.2 oz/week    2 Cans of beer per week     Comment: socially     Current Outpatient Prescriptions:  .  cetirizine-pseudoephedrine (ZYRTEC-D) 5-120 MG tablet, Take 1 tablet by mouth daily., Disp: , Rfl:  .  escitalopram (LEXAPRO) 20 MG tablet, Take 1 tablet (20 mg total) by mouth daily., Disp: 30 tablet, Rfl: 3 .  fluticasone (FLONASE) 50 MCG/ACT nasal spray, Place into both nostrils daily. Reported on 11/28/2015, Disp: , Rfl:  .  LORazepam (ATIVAN) 1 MG tablet, Take 1 tablet twice a day for anxiety for 1 week, then reduce to 1/2 tablet twice a day as needed (Patient not taking: Reported on 09/10/2016), Disp: 28 tablet, Rfl: 0  Not on File  Review of Systems  Constitutional: Negative for chills, fever, malaise/fatigue and weight loss.  HENT: Negative for hearing loss and tinnitus.   Eyes: Negative for blurred vision and double vision.  Respiratory: Negative for cough, hemoptysis, shortness of breath and wheezing.   Cardiovascular: Negative for chest pain, palpitations and leg swelling.  Gastrointestinal: Negative  for abdominal pain, blood in stool and heartburn.  Genitourinary: Negative for dysuria, frequency and urgency.  Musculoskeletal: Negative for myalgias.  Skin: Negative for rash.  Neurological: Negative for dizziness, tingling, tremors, weakness and headaches.  Psychiatric/Behavioral: Positive for depression. The patient is nervous/anxious and has insomnia.       Objective  Vitals:   09/10/16 0923  BP: 127/78  Pulse: (!) 51  Resp: 16  Temp: 97.9 F (36.6 C)  TempSrc: Oral  Weight: 70.8 kg (156 lb)  Height: 6' (1.829 m)     Physical Exam  Constitutional: He is oriented to person, place, and time and well-developed, well-nourished, and in no distress. No distress.  HENT:  Head: Normocephalic and atraumatic.  Eyes: Conjunctivae and EOM are normal. Pupils are equal, round, and reactive to light. No scleral icterus.  Neck: Normal range of motion. Neck supple. No thyromegaly present.  Cardiovascular: Normal rate, regular rhythm and normal heart sounds.  Exam reveals no gallop and no friction rub.   No murmur heard. Pulmonary/Chest: Effort normal and breath sounds normal. No respiratory distress. He has no wheezes. He has no rales.  Abdominal: Soft. Bowel sounds are normal. He exhibits no distension. There is no tenderness.  Musculoskeletal: He exhibits no edema.  Lymphadenopathy:    He has no cervical adenopathy.  Neurological: He is alert and  oriented to person, place, and time.  Psychiatric:  Anxious and minimally depressed.  Feels 3/10 overall.  Vitals reviewed.     No results found for this or any previous visit (from the past 2160 hour(s)).   Assessment & Plan  1. Need for influenza vaccination  - Flu Vaccine QUAD 36+ mos PF IM (Fluarix & Fluzone Quad PF)  2. Depression with anxiety  - escitalopram (LEXAPRO) 20 MG tablet; Take 1 tablet (20 mg total) by mouth daily.  Dispense: 30 tablet; Refill: 3- increased from 10 mg/d. Return in 6 weeks

## 2016-10-08 ENCOUNTER — Ambulatory Visit (INDEPENDENT_AMBULATORY_CARE_PROVIDER_SITE_OTHER): Payer: BLUE CROSS/BLUE SHIELD | Admitting: Family Medicine

## 2016-10-08 ENCOUNTER — Encounter: Payer: Self-pay | Admitting: Family Medicine

## 2016-10-08 VITALS — BP 127/75 | HR 70 | Temp 98.0°F | Resp 16 | Ht 72.0 in | Wt 158.0 lb

## 2016-10-08 DIAGNOSIS — F418 Other specified anxiety disorders: Secondary | ICD-10-CM

## 2016-10-08 MED ORDER — DULOXETINE HCL 30 MG PO CPEP: 30.0000 mg | ORAL_CAPSULE | Freq: Every day | ORAL | 3 refills | Status: DC

## 2016-10-08 NOTE — Progress Notes (Signed)
Name: Dominic Barnes   MRN: [id number]    DOB: 1991/02/24   Date:10/08/2016       Progress Note  Subjective  Chief Complaint  Chief Complaint  Patient presents with  . Depression  . Anxiety    HPI Here for f/u of anxiety and depression.  Still having problems with anxiety esp about testicular discomfort.  He has stopped all alcohol.  Gets "scared", usually about testicular issue, but has feeling of doom and helplessness.  No problem-specific Assessment & Plan notes found for this encounter.   Past Medical History:  Diagnosis Date  . Anxiety   . 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 Asthma    exercise induced. 1 episiode. Approx age 25.  [name]. Headache    sinus  . Sinus trouble   . Testicle pain     Past Surgical History:  Procedure Laterality Date  . ELBOW SURGERY     right  . IMAGE GUIDED SINUS SURGERY N/A 08/30/2015   Procedure: IMAGE GUIDED SINUS SURGERY;  Surgeon: Bud Facereighton Vaught, MD;  [facility];  Service: ENT;  Laterality: N/A;  GAVE DISK TO [name] 11/3  . MAXILLARY ANTROSTOMY Bilateral 08/30/2015   Procedure: MAXILLARY ANTROSTOMY;  Surgeon: Bud Facereighton Vaught, MD;  [facility];  Service: ENT;  Laterality: Bilateral;  . NASAL SINUS SURGERY    . SEPTOPLASTY N/A 08/30/2015   Procedure: SEPTOPLASTY;  Surgeon: Bud Facereighton Vaught, MD;  [facility];  Service: ENT;  Laterality: N/A;  . TONSILLECTOMY    . TURBINATE REDUCTION Bilateral 08/30/2015   Procedure: INFERIOR TURBINATE REDUCTION ;  Surgeon: Bud Facereighton Vaught, MD;  [facility];  Service: ENT;  Laterality: Bilateral;  . WISDOM TOOTH EXTRACTION      Family History  Problem Relation Age of Onset  . Cirrhosis Father   . Kidney disease Neg Hx   . Prostate cancer Neg Hx     Social History   Social History  . Marital status: Single    Spouse name: N/A  . Number of children: N/A  . Years of education: N/A   Occupational History  . Not on  file.   Social History Main Topics  . Smoking status: Former Smoker    Quit date: 07/21/2012  . Smokeless tobacco: Current User     Comment: quit 5 years /occasional dip  . Alcohol use 1.2 oz/week    2 Cans of beer per week     Comment: socially  . Drug use: No  . Sexual activity: Not on file   Other Topics Concern  . Not on file   Social History Narrative  . No narrative on file     Current Outpatient Prescriptions:  .  cetirizine-pseudoephedrine (ZYRTEC-D) 5-120 MG tablet, Take 1 tablet by mouth daily., Disp: , Rfl:  .  fluticasone (FLONASE) 50 MCG/ACT nasal spray, Place into both nostrils daily. Reported on 11/28/2015, Disp: , Rfl:  .  DULoxetine (CYMBALTA) 30 MG capsule, Take 1 capsule (30 mg total) by mouth daily. Start after tapering off Lexapro., Disp: 30 capsule, Rfl: 3  Not on File   Review of Systems  Constitutional: Negative for chills, fever, malaise/fatigue and weight loss.  HENT: Negative for hearing loss and tinnitus.   Eyes: Negative for blurred vision and double vision.  Respiratory: Negative for cough, shortness of breath and wheezing.   Cardiovascular: Negative for chest pain and palpitations.  Gastrointestinal: Negative for abdominal pain, blood in stool and heartburn.  Genitourinary: Negative for dysuria, frequency and urgency.       Scrotum feels uncomfortable daily.  Musculoskeletal: Negative for joint pain and myalgias.  Skin: Negative for rash.  Neurological: Negative for weakness.  Psychiatric/Behavioral: Positive for depression. The patient is nervous/anxious.       Objective  Vitals:   10/08/16 0823  BP: 127/75  Pulse: 70  Resp: 16  Temp: 98 F (36.7 C)  TempSrc: Oral  Weight: 158 lb (71.7 kg)  Height: 6' (1.829 m)    Physical Exam  Constitutional: He is oriented to person, place, and time and well-developed, well-nourished, and in no distress. No distress.  HENT:  Head: Normocephalic and atraumatic.  Neck: Normal range of  motion. Neck supple. Carotid bruit is not present. No thyromegaly present.  Cardiovascular: Normal rate, regular rhythm and normal heart sounds.  Exam reveals no gallop and no friction rub.   No murmur heard. Pulmonary/Chest: Effort normal and breath sounds normal. No respiratory distress. He has no wheezes. He has no rales.  Abdominal: Soft. Bowel sounds are normal. He exhibits no distension and no mass. There is no tenderness.  Musculoskeletal: He exhibits no edema.  Lymphadenopathy:    He has no cervical adenopathy.  Neurological: He is alert and oriented to person, place, and time.  Psychiatric:  Affect is anxious and depressed.  Anxiety score of 19.  Denies suicidal thoughts.  Vitals reviewed.      No results found for this or any previous visit (from the past 2160 hour(s)).   Assessment & Plan  Problem List Items Addressed This Visit      Other   Depression with anxiety - Primary   Relevant Medications   DULoxetine (CYMBALTA) 30 MG capsule      Meds ordered this encounter  Medications  . DULoxetine (CYMBALTA) 30 MG capsule    Sig: Take 1 capsule (30 mg total) by mouth daily. Start after tapering off Lexapro.    Dispense:  30 capsule    Refill:  3   1. Depression with anxiety Discontinue Lexapro (taper off as directed). - DULoxetine (CYMBALTA) 30 MG capsule; Take 1 capsule (30 mg total) by mouth daily. Start after tapering off Lexapro.  Dispense: 30 capsule; Refill: 3

## 2016-10-22 ENCOUNTER — Ambulatory Visit: Payer: Self-pay | Admitting: Urology

## 2016-10-28 ENCOUNTER — Encounter: Payer: Self-pay | Admitting: Family Medicine

## 2016-10-28 ENCOUNTER — Ambulatory Visit (INDEPENDENT_AMBULATORY_CARE_PROVIDER_SITE_OTHER): Payer: BLUE CROSS/BLUE SHIELD | Admitting: Family Medicine

## 2016-10-28 ENCOUNTER — Telehealth: Payer: Self-pay | Admitting: Family Medicine

## 2016-10-28 VITALS — BP 127/76 | HR 72 | Temp 97.9°F | Resp 16 | Ht 72.0 in | Wt 161.0 lb

## 2016-10-28 DIAGNOSIS — F418 Other specified anxiety disorders: Secondary | ICD-10-CM

## 2016-10-28 MED ORDER — LORAZEPAM 0.5 MG PO TABS: ORAL_TABLET | ORAL | 1 refills | Status: DC

## 2016-10-28 MED ORDER — LORAZEPAM 0.5 MG PO TABS: ORAL_TABLET | ORAL | 0 refills | Status: DC

## 2016-10-28 NOTE — Telephone Encounter (Signed)
Already taken care of.-jh

## 2016-10-28 NOTE — Telephone Encounter (Signed)
Pt. Called states that he was having an  Anxiety  Attack  Please advise what to do. Pt call back # is  [phone]

## 2016-10-28 NOTE — Telephone Encounter (Signed)
After discussing with Dr. Juanetta GoslingHawkins patient symptoms. He has decided to add short-term Ativan 0.5 mg. Patient instructed to take 1/2 tablet by mouth twice daily as needed. He has a follow-up scheduled in 2 weeks.

## 2016-10-28 NOTE — Progress Notes (Signed)
Name: Dominic Barnes   MRN: [id number]    DOB: [birth year]   Date:10/28/2016       Progress Note  Subjective  Chief Complaint  Chief Complaint  Patient presents with  . Depression  . Anxiety    HPI Here to f/u anxiety and depression.  Still having intermittent scrotal pain.  He has been off Lexapro and on Cymbalta for about 2 weeks.  He feels very depressed on and off about it.  Cries easily. No problem-specific Assessment & Plan notes found for this encounter.   Past Medical History:  Diagnosis Date  . Anxiety   . 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 Asthma    exercise induced. 1 episiode. Approx age 14.  [name] Headache    sinus  . Sinus trouble   . Testicle pain     Past Surgical History:  Procedure Laterality Date  . ELBOW SURGERY     right  . IMAGE GUIDED SINUS SURGERY N/A 08/30/2015   Procedure: IMAGE GUIDED SINUS SURGERY;  Surgeon: Carloyn Manner, MD;  Location: [place];  Service: ENT;  Laterality: N/A;  GAVE DISK TO [name] 11/3  . MAXILLARY ANTROSTOMY Bilateral 08/30/2015   Procedure: MAXILLARY ANTROSTOMY;  Surgeon: Carloyn Manner, MD;  Location: [place];  Service: ENT;  Laterality: Bilateral;  . NASAL SINUS SURGERY    . SEPTOPLASTY N/A 08/30/2015   Procedure: SEPTOPLASTY;  Surgeon: Carloyn Manner, MD;  Location: [place];  Service: ENT;  Laterality: N/A;  . TONSILLECTOMY    . TURBINATE REDUCTION Bilateral 08/30/2015   Procedure: INFERIOR TURBINATE REDUCTION ;  Surgeon: Carloyn Manner, MD;  Location: [place];  Service: ENT;  Laterality: Bilateral;  . WISDOM TOOTH EXTRACTION      Family History  Problem Relation Age of Onset  . Cirrhosis Father   . Kidney disease Neg Hx   . Prostate cancer Neg Hx     Social History   Social History  . Marital status: Single    Spouse name: N/A  . Number of children: N/A  . Years of education: N/A   Occupational History  . Not on file.   Social History Main Topics   . Smoking status: Former Smoker    Quit date: 07/21/2012  . Smokeless tobacco: Current User     Comment: quit 5 years /occasional dip  . Alcohol use 1.2 oz/week    2 Cans of beer per week     Comment: socially  . Drug use: No  . Sexual activity: Not on file   Other Topics Concern  . Not on file   Social History Narrative  . No narrative on file     Current Outpatient Prescriptions:  .  cetirizine-pseudoephedrine (ZYRTEC-D) 5-120 MG tablet, Take 1 tablet by mouth daily., Disp: , Rfl:  .  DULoxetine (CYMBALTA) 30 MG capsule, Take 1 capsule (30 mg total) by mouth daily. Start after tapering off Lexapro., Disp: 30 capsule, Rfl: 3 .  fluticasone (FLONASE) 50 MCG/ACT nasal spray, Place into both nostrils daily. Reported on 11/28/2015, Disp: , Rfl:   No Known Allergies   Review of Systems  Constitutional: Negative for chills, fever, malaise/fatigue and weight loss.  HENT: Negative for hearing loss and tinnitus.   Eyes: Negative for blurred vision and double vision.  Respiratory: Negative for cough, hemoptysis, shortness of breath and wheezing.   Cardiovascular: Negative for palpitations and leg swelling.  Gastrointestinal: Negative for abdominal pain, blood in stool and  heartburn.  Genitourinary: Negative for dysuria, frequency and urgency.  Musculoskeletal: Negative for joint pain and myalgias.  Skin: Negative for itching and rash.  Neurological: Negative for dizziness, tingling, tremors, weakness and headaches.  Psychiatric/Behavioral: Positive for depression. The patient is nervous/anxious.       Objective  Vitals:   10/28/16 0905  BP: 127/76  Pulse: 72  Resp: 16  Temp: 97.9 F (36.6 C)  TempSrc: Oral  Weight: 161 lb (73 kg)  Height: 6' (1.829 m)    Physical Exam  Constitutional: He is oriented to person, place, and time and well-developed, well-nourished, and in no distress. No distress.  HENT:  Head: Normocephalic and atraumatic.  Neck: Normal range of motion.  Neck supple. Carotid bruit is not present. No thyromegaly present.  Cardiovascular: Normal rate, regular rhythm and normal heart sounds.  Exam reveals no gallop and no friction rub.   No murmur heard. Pulmonary/Chest: Effort normal and breath sounds normal. He has no wheezes. He has no rales.  Abdominal: Soft. Bowel sounds are normal. He exhibits no distension and no mass. There is no tenderness.  Musculoskeletal: He exhibits no edema.  Lymphadenopathy:    He has no cervical adenopathy.  Neurological: He is alert and oriented to person, place, and time.  Psychiatric:  Affect is moderately depressed and anxious.  GAD score of 20.  PHQ-9 score of 20. Denies suicidal ideation.       No results found for this or any previous visit (from the past 2160 hour(s)).   Assessment & Plan  Problem List Items Addressed This Visit      Other   Depression with anxiety - Primary      No orders of the defined types were placed in this encounter. 1. Depression with anxiety Cont Cymbalta 30 mg/d. Ret to clinic in 2 weeks.

## 2016-10-28 NOTE — Addendum Note (Signed)
Addended by: Alease FrameARTER, Amrutha Avera S on: 10/28/2016 11:50 AM   Modules accepted: Orders

## 2016-11-11 ENCOUNTER — Ambulatory Visit: Payer: BLUE CROSS/BLUE SHIELD | Admitting: Family Medicine

## 2016-11-11 ENCOUNTER — Encounter: Payer: Self-pay | Admitting: Family Medicine

## 2016-11-11 VITALS — BP 140/68 | HR 62 | Temp 98.0°F | Resp 16 | Ht 72.0 in | Wt 162.0 lb

## 2016-11-11 DIAGNOSIS — F418 Other specified anxiety disorders: Secondary | ICD-10-CM

## 2016-11-11 NOTE — Progress Notes (Signed)
Name: Dominic Barnes   MRN: [id number]    DOB: [birth year]   Date:11/11/2016       Progress Note  Subjective  Chief Complaint  No chief complaint on file.   HPI Patient was here but rescheduled to another day.  HE was not seen by MD.  No problem-specific Assessment & Plan notes found for this encounter.   Past Medical History:  Diagnosis Date  . Anxiety   . 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 Asthma    exercise induced. 1 episiode. Approx age 26.  [name]. Headache    sinus  . Sinus trouble   . Testicle pain     Past Surgical History:  Procedure Laterality Date  . ELBOW SURGERY     right  . IMAGE GUIDED SINUS SURGERY N/A 08/30/2015   Procedure: IMAGE GUIDED SINUS SURGERY;  Surgeon: Bud Facereighton Vaught, MD;  [facility];  Service: ENT;  Laterality: N/A;  GAVE DISK TO [name] 11/3  . MAXILLARY ANTROSTOMY Bilateral 08/30/2015   Procedure: MAXILLARY ANTROSTOMY;  Surgeon: Bud Facereighton Vaught, MD;  [facility];  Service: ENT;  Laterality: Bilateral;  . NASAL SINUS SURGERY    . SEPTOPLASTY N/A 08/30/2015   Procedure: SEPTOPLASTY;  Surgeon: Bud Facereighton Vaught, MD;  [facility];  Service: ENT;  Laterality: N/A;  . TONSILLECTOMY    . TURBINATE REDUCTION Bilateral 08/30/2015   Procedure: INFERIOR TURBINATE REDUCTION ;  Surgeon: Bud Facereighton Vaught, MD;  [facility];  Service: ENT;  Laterality: Bilateral;  . WISDOM TOOTH EXTRACTION      Family History  Problem Relation Age of Onset  . Cirrhosis Father   . Kidney disease Neg Hx   . Prostate cancer Neg Hx     Social History   Social History  . Marital status: Single    Spouse name: N/A  . Number of children: N/A  . Years of education: N/A   Occupational History  . Not on file.   Social History Main Topics  . Smoking status: Former Smoker    Quit date: 07/21/2012  . Smokeless tobacco: Current User     Comment: quit 5 years /occasional dip  . Alcohol use 1.2  oz/week    2 Cans of beer per week     Comment: socially  . Drug use: No  . Sexual activity: Not on file   Other Topics Concern  . Not on file   Social History Narrative  . No narrative on file     Current Outpatient Prescriptions:  .  cetirizine-pseudoephedrine (ZYRTEC-D) 5-120 MG tablet, Take 1 tablet by mouth daily., Disp: , Rfl:  .  DULoxetine (CYMBALTA) 30 MG capsule, Take 1 capsule (30 mg total) by mouth daily. Start after tapering off Lexapro., Disp: 30 capsule, Rfl: 3 .  fluticasone (FLONASE) 50 MCG/ACT nasal spray, Place into both nostrils daily. Reported on 11/28/2015, Disp: , Rfl:  .  LORazepam (ATIVAN) 0.5 MG tablet, Take one-half tablet twice daily as needed anxiety., Disp: 20 tablet, Rfl: 0  Not on File   ROS NONE   Objective  Vitals:   11/11/16 0822  BP: 140/68  Pulse: 62  Resp: 16  Temp: 98 F (36.7 C)  TempSrc: Oral  Weight: 162 lb (73.5 kg)  Height: 6' (1.829 m)    Physical Exam   NONE  No results found for this or any previous visit (from the past 2160 hour(s)).   Assessment & Plan  Problem List  Items Addressed This Visit    None      No orders of the defined types were placed in this encounter.

## 2016-11-14 ENCOUNTER — Encounter: Payer: Self-pay | Admitting: Family Medicine

## 2016-11-14 ENCOUNTER — Ambulatory Visit (INDEPENDENT_AMBULATORY_CARE_PROVIDER_SITE_OTHER): Payer: BLUE CROSS/BLUE SHIELD | Admitting: Family Medicine

## 2016-11-14 VITALS — BP 123/64 | HR 58 | Temp 97.8°F | Resp 16 | Ht 72.0 in | Wt 159.0 lb

## 2016-11-14 DIAGNOSIS — N5082 Scrotal pain: Secondary | ICD-10-CM

## 2016-11-14 DIAGNOSIS — F418 Other specified anxiety disorders: Secondary | ICD-10-CM | POA: Diagnosis not present

## 2016-11-14 MED ORDER — CIPROFLOXACIN HCL 500 MG PO TABS: 500.0000 mg | ORAL_TABLET | Freq: Two times a day (BID) | ORAL | 0 refills | Status: DC

## 2016-11-14 MED ORDER — VENLAFAXINE HCL ER 37.5 MG PO CP24: 37.5000 mg | ORAL_CAPSULE | Freq: Every day | ORAL | 0 refills | Status: DC

## 2016-11-14 MED ORDER — VENLAFAXINE HCL ER 75 MG PO CP24: 75.0000 mg | ORAL_CAPSULE | Freq: Every day | ORAL | 6 refills | Status: DC

## 2016-11-14 NOTE — Progress Notes (Signed)
Name: Dominic Barnes   MRN: [id number]    DOB: 1990/11/03   Date:11/14/2016       Progress Note  Subjective  Chief Complaint  Chief Complaint  Patient presents with  . Depression    HPI He is here for continued anxiety.  Still c/o testicular pain , esp with exercise.  Wishes another opinion.  He c/o sexual side effects with Cymbalta.  Also feels that he has trouble urinating and trouble with leaking urine and has pain after ejaculation.     No problem-specific Assessment & Plan notes found for this encounter.   Past Medical History:  Diagnosis Date  . Anxiety   . 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 Asthma    exercise induced. 1 episiode. Approx age 26.  [name]. Headache    sinus  . Sinus trouble   . Testicle pain     Past Surgical History:  Procedure Laterality Date  . ELBOW SURGERY     right  . IMAGE GUIDED SINUS SURGERY N/A 08/30/2015   Procedure: IMAGE GUIDED SINUS SURGERY;  Surgeon: Bud Facereighton Vaught, MD;  [facility];  Service: ENT;  Laterality: N/A;  GAVE DISK TO [name] 11/3  . MAXILLARY ANTROSTOMY Bilateral 08/30/2015   Procedure: MAXILLARY ANTROSTOMY;  Surgeon: Bud Facereighton Vaught, MD;  [facility];  Service: ENT;  Laterality: Bilateral;  . NASAL SINUS SURGERY    . SEPTOPLASTY N/A 08/30/2015   Procedure: SEPTOPLASTY;  Surgeon: Bud Facereighton Vaught, MD;  [facility];  Service: ENT;  Laterality: N/A;  . TONSILLECTOMY    . TURBINATE REDUCTION Bilateral 08/30/2015   Procedure: INFERIOR TURBINATE REDUCTION ;  Surgeon: Bud Facereighton Vaught, MD;  [facility];  Service: ENT;  Laterality: Bilateral;  . WISDOM TOOTH EXTRACTION      Family History  Problem Relation Age of Onset  . Cirrhosis Father   . Kidney disease Neg Hx   . Prostate cancer Neg Hx     Social History   Social History  . Marital status: Single    Spouse name: N/A  . Number of children: N/A  . Years of education: N/A   Occupational  History  . Not on file.   Social History Main Topics  . Smoking status: Former Smoker    Quit date: 07/21/2012  . Smokeless tobacco: Current User     Comment: quit 5 years /occasional dip  . Alcohol use 1.2 oz/week    2 Cans of beer per week     Comment: socially  . Drug use: No  . Sexual activity: Not on file   Other Topics Concern  . Not on file   Social History Narrative  . No narrative on file     Current Outpatient Prescriptions:  .  cetirizine-pseudoephedrine (ZYRTEC-D) 5-120 MG tablet, Take 1 tablet by mouth daily., Disp: , Rfl:  .  fluticasone (FLONASE) 50 MCG/ACT nasal spray, Place into both nostrils daily. Reported on 11/28/2015, Disp: , Rfl:  .  LORazepam (ATIVAN) 0.5 MG tablet, Take one-half tablet twice daily as needed anxiety., Disp: 20 tablet, Rfl: 0 .  ciprofloxacin (CIPRO) 500 MG tablet, Take 1 tablet (500 mg total) by mouth 2 (two) times daily., Disp: 28 tablet, Rfl: 0 .  venlafaxine XR (EFFEXOR XR) 37.5 MG 24 hr capsule, Take 1 capsule (37.5 mg total) by mouth daily with breakfast., Disp: 7 capsule, Rfl: 0 .  venlafaxine XR (EFFEXOR XR) 75 MG 24 hr capsule, Take 1 capsule (  75 mg total) by mouth daily with breakfast., Disp: 30 capsule, Rfl: 6  Not on File   Review of Systems  Constitutional: Negative for chills, diaphoresis, fever, malaise/fatigue and weight loss.  HENT: Negative for hearing loss and tinnitus.   Eyes: Negative for blurred vision and double vision.  Respiratory: Negative for cough, shortness of breath and wheezing.   Cardiovascular: Negative for chest pain, palpitations and leg swelling.  Gastrointestinal: Negative for abdominal pain, blood in stool and heartburn.  Genitourinary: Negative for dysuria, frequency and urgency.       Continued scrotal/testicular pain.   Musculoskeletal: Negative for joint pain and myalgias.  Skin: Negative for rash.  Neurological: Negative for dizziness, tingling, tremors, weakness and headaches.       Objective  Vitals:   11/14/16 0846  BP: 123/64  Pulse: (!) 58  Resp: 16  Temp: 97.8 F (36.6 C)  TempSrc: Oral  Weight: 159 lb (72.1 kg)  Height: 6' (1.829 m)    Physical Exam  Constitutional: He is well-developed, well-nourished, and in no distress. No distress.  HENT:  Head: Normocephalic and atraumatic.  Eyes: EOM are normal.  Neck: Normal range of motion. Neck supple.  Cardiovascular: Normal rate, regular rhythm and normal heart sounds.   Pulmonary/Chest: Effort normal and breath sounds normal.  Abdominal: Soft. Bowel sounds are normal. He exhibits no distension and no mass. There is no tenderness.  Genitourinary: Rectum normal and penis normal. No discharge found.  Genitourinary Comments: Prostate is normal size and consistency.  Symmetrical.  No nodules.  Patient reports minimal pain. Epididymitis not tender. No inguinal hernias.  Psychiatric:  Very anxious and depressed.  PHQ-9 score of 13 (hx of 20 before).  Vitals reviewed.      No results found for this or any previous visit (from the past 2160 hour(s)).   Assessment & Plan  Problem List Items Addressed This Visit      Other   Scrotal pain   Relevant Medications   ciprofloxacin (CIPRO) 500 MG tablet   Other Relevant Orders   Ambulatory referral to Urology   Depression with anxiety - Primary   Relevant Medications   venlafaxine XR (EFFEXOR XR) 37.5 MG 24 hr capsule   venlafaxine XR (EFFEXOR XR) 75 MG 24 hr capsule      Meds ordered this encounter  Medications  . ciprofloxacin (CIPRO) 500 MG tablet    Sig: Take 1 tablet (500 mg total) by mouth 2 (two) times daily.    Dispense:  28 tablet    Refill:  0  . venlafaxine XR (EFFEXOR XR) 37.5 MG 24 hr capsule    Sig: Take 1 capsule (37.5 mg total) by mouth daily with breakfast.    Dispense:  7 capsule    Refill:  0  . venlafaxine XR (EFFEXOR XR) 75 MG 24 hr capsule    Sig: Take 1 capsule (75 mg total) by mouth daily with breakfast.     Dispense:  30 capsule    Refill:  6   1. Depression with anxiety Discontinue Cymbalta - venlafaxine XR (EFFEXOR XR) 37.5 MG 24 hr capsule; Take 1 capsule (37.5 mg total) by mouth daily with breakfast.  Dispense: 7 capsule; Refill: 0 - venlafaxine XR (EFFEXOR XR) 75 MG 24 hr capsule; Take 1 capsule (75 mg total) by mouth daily with breakfast.  Dispense: 30 capsule; Refill: 6 - start after finishing 37.5 mg dose.  2. Scrotal pain  - ciprofloxacin (CIPRO) 500 MG tablet; Take 1 tablet (  500 mg total) by mouth 2 (two) times daily.  Dispense: 28 tablet; Refill: 0 - Ambulatory referral to Urology

## 2016-11-18 ENCOUNTER — Ambulatory Visit: Payer: Self-pay | Admitting: Family Medicine

## 2016-11-29 ENCOUNTER — Telehealth: Payer: Self-pay

## 2016-11-29 NOTE — Telephone Encounter (Signed)
Called patient back, approx 1320, but did not reach, he returned my call approx 1330. I reviewed his chart as I have never seen this patient in office, he is [facility], last seen 11/14/16. He reviewed some past history with me over phone that he has been struggling with chronic scrotal pain and swelling for past 2 years with intermittent flares, [facility] has had ultrasounds now he is awaiting apt with [facility] 12/12/16. His concern now is with secondary anxiety with panic attacks related to this, see office notes for more details. He has been on a variety of medications over past since 07/2016 with Lexapro and Ativan PRN, then Cymbalta, then most recently Effexor, started 37.5mg  daily for 1 week on 11/14/16 then increase to 75mg  daily, he has had poor results on all SSRI and SNRI with some side effects and sometimes ineffective, described currently feeling like Venlafaxine is a "placebo", more recently he has decided to self taper off of the medication, has not taken in 2 days, today started to feel dizzy spells, and has been experiencing more panic for past few days.  I advised him that most likely the Venlafaxine has not reached full effect, since it can take up to 4-6 weeks to get significant improvement on this class of medications, however he would like to continue to taper off, recommended that he alternate dosing 75mg  every other day for 1-2 weeks until off, the dizziness could certainly be related. Recommend improved hydration as well.  He is asking about more long term management concerns of the anxiety, and is frustrating with trying different options, I advised him that this would likely require an office visit, as I have never seen him in office before, but I would tend to agree with Dr Adah PerlHawkin's previous management of trial on variety of SSRI, SNRI meds and working on dosage adjustments, often it can be trial and error on these meds, and  there is not much I would do differently, there are several other meds out there that can possibly work, but then advised him that he may benefit more from establishing care with Psychiatry and likely Therapy for counseling given his frustration with poor results so far.  Gave him contact info for [facility]. To consider walk in today or calling for apt to establish. No referral needed.  [facility] [place] [address], [place], [place] [address] Phone: [phone]  In future if needed referral to [facility] or [facility] can consider these options as well.  Additionally advised him to seek more urgent medical attention if anxiety severely worsened going into weekend, such as urgent care or ED.  [name], DO [facility] 11/29/2016, 2:25 PM

## 2016-11-29 NOTE — Telephone Encounter (Signed)
Patient called reporting that he is having dizzy spells today.  He did take a Zyrtec D today for allergies, however, he has currently tapering off the Effexor, on is own, due to panic attaches getting worse.  He wanted to let us be aware of symptoms and would like someone to call him back.  Please call patient back.  [phone]

## 2016-12-02 NOTE — Telephone Encounter (Signed)
Noted-jh 

## 2016-12-16 ENCOUNTER — Encounter: Payer: Self-pay | Admitting: Family Medicine

## 2016-12-16 ENCOUNTER — Ambulatory Visit (INDEPENDENT_AMBULATORY_CARE_PROVIDER_SITE_OTHER): Payer: BLUE CROSS/BLUE SHIELD | Admitting: Family Medicine

## 2016-12-16 VITALS — BP 122/76 | HR 73 | Temp 97.6°F | Resp 16 | Ht 72.0 in | Wt 158.0 lb

## 2016-12-16 DIAGNOSIS — F418 Other specified anxiety disorders: Secondary | ICD-10-CM

## 2016-12-16 MED ORDER — FLUOXETINE HCL 20 MG PO TABS: 20.0000 mg | ORAL_TABLET | Freq: Every day | ORAL | 3 refills | Status: DC

## 2016-12-16 NOTE — Progress Notes (Signed)
Name: Dominic Barnes   MRN: [id number]    DOB: January 15, 1991   Date:12/16/2016       Progress Note  Subjective  Chief Complaint  Chief Complaint  Patient presents with  . Depression    with anxiety    HPI Here for f/u of hgis testicular burning pain and chronic anxiety and p[anic attacks.  He has been unhappy with all treatments that se have tried.  He has appointment with Dr. Achilles Dunkope in 6 weeks.  He is reluctant to take anything that will take time to work.  No problem-specific Assessment & Plan notes found for this encounter.   Past Medical History:  Diagnosis Date  . Anxiety   . 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 Asthma    exercise induced. 1 episiode. Approx age 26.  [name]. Headache    sinus  . Sinus trouble   . Testicle pain     Past Surgical History:  Procedure Laterality Date  . ELBOW SURGERY     right  . IMAGE GUIDED SINUS SURGERY N/A 08/30/2015   Procedure: IMAGE GUIDED SINUS SURGERY;  Surgeon: Bud Facereighton Vaught, MD;  [facility];  Service: ENT;  Laterality: N/A;  GAVE DISK TO [name] 11/3  . MAXILLARY ANTROSTOMY Bilateral 08/30/2015   Procedure: MAXILLARY ANTROSTOMY;  Surgeon: Bud Facereighton Vaught, MD;  [facility];  Service: ENT;  Laterality: Bilateral;  . NASAL SINUS SURGERY    . SEPTOPLASTY N/A 08/30/2015   Procedure: SEPTOPLASTY;  Surgeon: Bud Facereighton Vaught, MD;  [facility];  Service: ENT;  Laterality: N/A;  . TONSILLECTOMY    . TURBINATE REDUCTION Bilateral 08/30/2015   Procedure: INFERIOR TURBINATE REDUCTION ;  Surgeon: Bud Facereighton Vaught, MD;  [facility];  Service: ENT;  Laterality: Bilateral;  . WISDOM TOOTH EXTRACTION      Family History  Problem Relation Age of Onset  . Cirrhosis Father   . Kidney disease Neg Hx   . Prostate cancer Neg Hx     Social History   Social History  . Marital status: Single    Spouse name: N/A  . Number of children: N/A  . Years of education: N/A    Occupational History  . Not on file.   Social History Main Topics  . Smoking status: Former Smoker    Quit date: 07/21/2012  . Smokeless tobacco: Current User     Comment: quit 5 years /occasional dip  . Alcohol use 1.2 oz/week    2 Cans of beer per week     Comment: socially  . Drug use: No  . Sexual activity: Not on file   Other Topics Concern  . Not on file   Social History Narrative  . No narrative on file     Current Outpatient Prescriptions:  .  cetirizine-pseudoephedrine (ZYRTEC-D) 5-120 MG tablet, Take 1 tablet by mouth daily., Disp: , Rfl:  .  fluticasone (FLONASE) 50 MCG/ACT nasal spray, Place into both nostrils daily. Reported on 11/28/2015, Disp: , Rfl:  .  FLUoxetine (PROZAC) 20 MG tablet, Take 1 tablet (20 mg total) by mouth daily., Disp: 30 tablet, Rfl: 3  Not on File   Review of Systems  Constitutional: Negative for chills, diaphoresis, fever and weight loss.  HENT: Negative for hearing loss and tinnitus.   Eyes: Negative for blurred vision and double vision.  Respiratory: Negative for cough, shortness of breath and wheezing.   Cardiovascular: Negative for chest pain, palpitations and leg swelling.  Gastrointestinal: Negative for abdominal pain, blood in stool and heartburn.  Genitourinary: Negative for dysuria, frequency and urgency.       Testicular pain and burning with activity  Skin: Negative for rash.  Neurological: Negative for dizziness, tingling, tremors, weakness and headaches.  Psychiatric/Behavioral: The patient is nervous/anxious.       Objective  Vitals:   12/16/16 1543  BP: 122/76  Pulse: 73  Resp: 16  Temp: 97.6 F (36.4 C)  TempSrc: Oral  Weight: 158 lb (71.7 kg)  Height: 6' (1.829 m)    Physical Exam No physical exam done today.  Long discussion re: options and thoughts.  After long discussion he has decided to try Prozac.  No results found for this or any previous visit (from the past 2160 hour(s)).   Assessment &  Plan  Problem List Items Addressed This Visit      Other   Depression with anxiety - Primary   Relevant Medications   FLUoxetine (PROZAC) 20 MG tablet      Meds ordered this encounter  Medications  . FLUoxetine (PROZAC) 20 MG tablet    Sig: Take 1 tablet (20 mg total) by mouth daily.    Dispense:  30 tablet    Refill:  3   1. Depression with anxiety  - FLUoxetine (PROZAC) 20 MG tablet; Take 1 tablet (20 mg total) by mouth daily.  Dispense: 30 tablet; Refill: 3

## 2017-03-07 ENCOUNTER — Encounter: Payer: Self-pay | Admitting: Nurse Practitioner

## 2017-03-07 ENCOUNTER — Ambulatory Visit (INDEPENDENT_AMBULATORY_CARE_PROVIDER_SITE_OTHER): Payer: BLUE CROSS/BLUE SHIELD | Admitting: Nurse Practitioner

## 2017-03-07 VITALS — BP 140/67 | HR 53 | Ht 72.0 in | Wt 156.8 lb

## 2017-03-07 DIAGNOSIS — R300 Dysuria: Secondary | ICD-10-CM

## 2017-03-07 DIAGNOSIS — M62830 Muscle spasm of back: Secondary | ICD-10-CM | POA: Diagnosis not present

## 2017-03-07 DIAGNOSIS — N411 Chronic prostatitis: Secondary | ICD-10-CM | POA: Diagnosis not present

## 2017-03-07 LAB — POCT URINALYSIS DIPSTICK
Bilirubin, UA: NEGATIVE
Blood, UA: NEGATIVE
Clarity, UA: NEGATIVE
Color, UA: NEGATIVE
Glucose, UA: NEGATIVE
Ketones, UA: NEGATIVE
Leukocytes, UA: NEGATIVE
Nitrite, UA: NEGATIVE
Protein, UA: NEGATIVE
Spec Grav, UA: 1.01 (ref 1.010–1.025)
Urobilinogen, UA: 0.2 E.U./dL
pH, UA: 5 (ref 5.0–8.0)

## 2017-03-07 MED ORDER — BACLOFEN 10 MG PO TABS: 10.0000 mg | ORAL_TABLET | Freq: Three times a day (TID) | ORAL | 0 refills | Status: AC | PRN

## 2017-03-07 MED ORDER — OXYBUTYNIN CHLORIDE ER 10 MG PO TB24: 10.0000 mg | ORAL_TABLET | Freq: Every day | ORAL | 0 refills | Status: DC

## 2017-03-07 NOTE — Patient Instructions (Addendum)
Dominic Barnes, Thank you for coming in to clinic today.  1. You are having a back spasm.   -STRETCH, massage with tennis balls, take baclofen 10 mg up to 3 times daily as needed.  This can make you sleepy, so don't take if driving. - Start taking Tylenol extra strength 1 to 2 tablets every 6-8 hours for aches or fever/chills for next few days as needed.  Do not take more than 3,000 mg in 24 hours from all medicines.   - Use heat and ice.  Apply this for 15 minutes at a time 6-8 times per day.   - Muscle rub with lidocaine.  Avoid using this with heat and ice to avoid burns.  2. You have an exacerbation of your prostatitis. - Continue your alfuzosin 10 mg daily. - START oxybutynin 10 once daily at bedtime.  THis should help with your incomplete emptying  - ALSO take ibuprofen 600mg  every 8 hours to reduce the inflammation you are having. - If worsening of symptoms or an inability to urinate over the weekend, go to urgent care or emergency room.   Please schedule a follow-up appointment with [name], AGNP to Return 5-7 days as needed for continuing or worsening symptoms.  Call your urology office and schedule an appointment as soon as you are able.  We will also try to get you in sooner.  If you have any other questions or concerns, please feel free to call the clinic or send a message through MyChart. You may also schedule an earlier appointment if necessary.  [name], DNP, AGNP-BC Adult Gerontology Nurse Practitioner [facility], [facility]   Prostatitis Prostatitis is swelling or inflammation of the prostate gland. The prostate is a walnut-sized gland that is involved in the production of semen. It is located below a man's bladder, in front of the rectum. There are four types of prostatitis:  Chronic nonbacterial prostatitis. This is the most common type of prostatitis. It may be associated with a viral infection or autoimmune disorder.  Acute bacterial prostatitis.  This is the least common type of prostatitis. It starts quickly and is usually associated with a bladder infection, high fever, and shaking chills. It can occur at any age.  Chronic bacterial prostatitis. This type usually results from acute bacterial prostatitis that happens repeatedly (is recurrent) or has not been treated properly. It can occur in men of any age but is most common among middle-aged men whose prostate has begun to get larger. The symptoms are not as severe as symptoms caused by acute bacterial prostatitis.  Prostatodynia or chronic pelvic pain syndrome (CPPS). This type is also called pelvic floor disorder. It is associated with increased muscular tone in the pelvis surrounding the prostate. What are the causes? Bacterial prostatitis is caused by infection from bacteria. Chronic nonbacterial prostatitis may be caused by:  Urinary tract infections (UTIs).  Nerve damage.  A response by the body's disease-fighting system (autoimmune response).  Chemicals in the urine. The causes of the other types of prostatitis are usually not known. What are the signs or symptoms? Symptoms of this condition vary depending upon the type of prostatitis. If you have acute bacterial prostatitis, you may experience:  Urinary symptoms, such as:  Painful urination.  Burning during urination.  Frequent and sudden urges to urinate.  Inability to start urinating.  A weak or interrupted stream of urine.  Vomiting.  Nausea.  Fever.  Chills.  Inability to empty the bladder completely.  Pain in  the:  Muscles or joints.  Lower back.  Lower abdomen. If you have any of the other types of prostatitis, you may experience:  Urinary symptoms, such as:  Sudden urges to urinate.  Frequent urination.  Difficulty starting urination.  Weak urine stream.  Dribbling after urination.  Discharge from the urethra. The urethra is a tube that opens at the end of the penis.  Pain in  the:  Testicles.  Penis or tip of the penis.  Rectum.  Area in front of the rectum and below the scrotum (perineum).  Problems with sexual function.  Painful ejaculation.  Bloody semen. How is this diagnosed? This condition may be diagnosed based on:  A physical and medical exam.  Your symptoms.  A urine test to check for bacteria.  An exam in which a health care provider uses a finger to feel the prostate (digital rectal exam).  A test of a sample of semen.  Blood tests.  Ultrasound.  Removal of prostate tissue to be examined under a microscope (biopsy).  Tests to check how your body handles urine (urodynamic tests).  A test to look inside your bladder or urethra (cystoscopy). How is this treated? Treatment for this condition depends on the type of prostatitis. Treatment may involve:  Medicines to relieve pain or inflammation.  Medicines to help relax your muscles.  Physical therapy.  Heat therapy.  Techniques to help you control certain body functions (biofeedback).  Relaxation exercises.  Antibiotic medicine, if your condition is caused by bacteria.  Warm water baths (sitz baths). Sitz baths help with relaxing your pelvic floor muscles, which helps to relieve pressure on the prostate. Follow these instructions at home:  Take over-the-counter and prescription medicines only as told by your health care provider.  If you were prescribed an antibiotic, take it as told by your health care provider. Do not stop taking the antibiotic even if you start to feel better.  If physical therapy, biofeedback, or relaxation exercises were prescribed, do exercises as instructed.  Take sitz baths as directed by your health care provider. For a sitz bath, sit in warm water that is deep enough to cover your hips and buttocks.  Keep all follow-up visits as told by your health care provider. This is important. Contact a health care provider if:  Your symptoms get  worse.  You have a fever. Get help right away if:  You have chills.  You feel nauseous.  You vomit.  You feel light-headed or feel like you are going to faint.  You are unable to urinate.  You have blood or blood clots in your urine. This information is not intended to replace advice given to you by your health care provider. Make sure you discuss any questions you have with your health care provider. Document Released: 10/04/2000 Document Revised: 06/27/2016 Document Reviewed: 06/27/2016 Elsevier Interactive Patient Education  2017 [employer]

## 2017-03-07 NOTE — Progress Notes (Signed)
Subjective:    Patient ID: Dominic Barnes G Ropp, male    DOB: 1991/07/13, 26 y.o.   MRN: [id number]  Dominic HornRobert G Doggett is a 26 y.o. male presenting on 03/07/2017 for Urinary Tract Infection (possible UTI, lower back pain, dysuria. Pt associates the pain mostly with post ejaculation 4 days ago)   HPI No UTI indicated by POCT dipstick today.  Pain with urination Previously has had prostatitis and is still undergoing treatment.  Next appointment with Urology is in June.He is currently takin alfuzosin (Uroxatral) 10 mg daily at the direction of [facility].  He has quit drinking most caffeine (limits to 1 cup of coffee daily instead of energy drinks throughout the day) and has stopped taking sudafed. Only taking flonase for allergies per recommendation of urology.  New symptoms of dysuria started Sunday night after ejaculation via masturbation.  No sex since last STD check at onset of prostatitis.  He has participated in other masturbation since diagnosis without incident.  He has not had an ejaculation since Sunday.  Dysuria is characterized by incomplete emptying, burning x 15-20 mins after stopping stream, small stream requiring starts and stops.  Denies fever, chills, urgency, frequency, hematuria, burning prior to urination, pelvic pain, or flank pain.  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 He has had back pain in past.  This pain is different and pulsating and started Monday.  Not worsening and is located across low back.  He has not taken anything for this pain. He does not endorse any specific injury or sudden onset of pain while working.   Social History  Substance Use Topics  . Smoking status: Former Smoker    Quit date: 07/21/2012  . Smokeless tobacco: Current User     Comment: quit 5 years /occasional dip  . Alcohol use 1.2 oz/week    2 Cans of beer per week     Comment: socially    Review of Systems    Constitutional: Negative.    Per HPI unless specifically indicated above     Objective:    BP 140/67   Pulse (!) 53   Ht 6' (1.829 m)   Wt 156 lb 12.8 oz (71.1 kg)   BMI 21.27 kg/m   Wt Readings from Last 3 Encounters:  03/07/17 156 lb 12.8 oz (71.1 kg)  12/16/16 158 lb (71.7 kg)  11/14/16 159 lb (72.1 kg)    Physical Exam  Constitutional: He is oriented to person, place, and time. He appears well-developed and well-nourished. No distress.  HENT:  Head: Normocephalic and atraumatic.  Eyes: Conjunctivae are normal. Pupils are equal, round, and reactive to light.  Cardiovascular: Normal rate, regular rhythm and normal heart sounds.   Pulmonary/Chest: Breath sounds normal. No respiratory distress.  Abdominal: Soft. Bowel sounds are normal. He exhibits no distension. There is no hepatosplenomegaly. There is no tenderness. There is no CVA tenderness.  Genitourinary: Rectal exam shows tenderness. Rectal exam shows no external hemorrhoid, no internal hemorrhoid and anal tone normal. Prostate is enlarged and tender.  Genitourinary Comments: Unable to penetrate anus r/t significant pain elicited on exam.  Prostate palpable from exterior perineum with extreme tenderness.  Musculoskeletal: Normal range of motion.  Lumbar Spine Inspection: Normal, symmetric without trauma Palpation: No bony tenderness,  Muscles bilateral lumbar spine hypertonic and tender to palpation.  In active spasm. ROM: Normal and full AROM & PROM Special Testing: none  Strength: normal Neurovascular: Normal sensation noted in BLE without sciatica  Neurological: He is alert and oriented to person, place, and time.  Skin: Skin is warm and dry.  Psychiatric: He has a normal mood and affect. His behavior is normal. Judgment and thought content normal.  Vitals reviewed.   Results for orders placed or performed in visit on 03/07/17  POCT Urinalysis Dipstick  Result Value Ref Range   Color, UA negative    Clarity,  UA negative    Glucose, UA negative    Bilirubin, UA negative    Ketones, UA negative    Spec Grav, UA 1.010 1.010 - 1.025   Blood, UA negative    pH, UA 5.0 5.0 - 8.0   Protein, UA negative    Urobilinogen, UA 0.2 0.2 or 1.0 E.U./dL   Nitrite, UA negative    Leukocytes, UA Negative Negative      Assessment & Plan:   Problem List Items Addressed This Visit      Genitourinary   Chronic prostatitis - Primary    Acutely worsening symptoms associated with ejaculation 4 days ago.  Plan: 1. Pt needs to visit urologist.  Attempt to call [facility] urology made and CMA stayed on hold waiting for answer for about 40 minutes without answer.  Pt frustrated because he has had similar experiences.  States he would return to [facility] if needed to be seen urgently.  Will follow up Monday.  Gave specific instructions to seek emergency care if unable to void in 24 hours or significantly worsening symptoms. 2. Continue Uroxatral. 3. Start ibuprofen 600 mg every 8 hours. 4. For incomplete emptying, start oxybutynin 10 mg.       Other Visit Diagnoses    Dysuria       Relevant Medications   alfuzosin (UROXATRAL) 10 MG 24 hr tablet   oxybutynin (DITROPAN-XL) 10 MG 24 hr tablet   Other Relevant Orders   POCT Urinalysis Dipstick (Completed)   Urine Culture   Spasm of muscle of lower back     Acute pain with active spasm.  Pt with overuse injury.  Plan: 1. Encouraged pressure point massage with tennis balls/racquet balls. 2. Discussed stretching and strengthening and proper use of body mechanics. 3. Can alternate tylenol with acetaminophen. 4. Apply heat / ice. May use muscle rub with lidocaine. 5. Take baclofen 10 mg up to 3 times daily as needed for spasm.  Stop after 10 days.  Use caution before driving as this medication can cause drowsiness.   Relevant Medications   baclofen (LIORESAL) 10 MG tablet      Meds ordered this encounter  Medications  . alfuzosin (UROXATRAL) 10 MG 24  hr tablet    Sig: Take 10 mg by mouth.  . baclofen (LIORESAL) 10 MG tablet    Sig: Take 1 tablet (10 mg total) by mouth 3 (three) times daily as needed for muscle spasms.    Dispense:  30 each    Refill:  0    Order Specific Question:   Supervising Provider    Answer:   Smitty Cords [[id number]]  . oxybutynin (DITROPAN-XL) 10 MG 24 hr tablet    Sig: Take 1 tablet (10 mg total) by mouth at bedtime.    Dispense:  15 tablet    Refill:  0    Order Specific Question:   Supervising Provider    Answer:   Smitty Cords [[id number]]      Follow up plan: Return 5-7  days as needed for continuing or worsening symptoms.   [name], DNP, AGPCNP-BC Adult Gerontology Primary Care Nurse Practitioner [facility] 03/09/2017, 10:52 PM

## 2017-03-09 DIAGNOSIS — N411 Chronic prostatitis: Secondary | ICD-10-CM | POA: Insufficient documentation

## 2017-03-09 NOTE — Assessment & Plan Note (Signed)
Acutely worsening symptoms associated with ejaculation 4 days ago.  Plan: 1. Pt needs to visit urologist.  [facility] to [facility] made and CMA stayed on hold waiting for answer for about 40 minutes without answer.  Pt frustrated because he has had similar experiences.  States he would return [facility] if needed to be seen urgently.  Will follow up Monday.  Gave specific instructions to seek emergency care if unable to void in 24 hours or significantly worsening symptoms. 2. Continue Uroxatral. 3. Start ibuprofen 600 mg every 8 hours. 4. For incomplete emptying, start oxybutynin 10 mg.

## 2017-03-10 ENCOUNTER — Other Ambulatory Visit: Payer: Self-pay

## 2017-03-10 ENCOUNTER — Telehealth: Payer: Self-pay | Admitting: Nurse Practitioner

## 2017-03-10 DIAGNOSIS — N411 Chronic prostatitis: Secondary | ICD-10-CM

## 2017-03-10 NOTE — Progress Notes (Signed)
I have reviewed this encounter including the documentation in this note and/or discussed this patient with the provider, Wilhelmina McardleLauren Kennedy, AGPCNP-BC. I am certifying that I agree with the content of this note as supervising physician.  Saralyn PilarAlexander Izora Benn, DO [facility] 03/10/2017, 1:14 PM

## 2017-03-10 NOTE — Telephone Encounter (Signed)
Referral was order for the pt concerning his chronic prostatitis.

## 2017-03-10 NOTE — Telephone Encounter (Signed)
Pt called to check the status of referral to urology.  His call back number is [phone]

## 2017-03-12 DIAGNOSIS — R102 Pelvic and perineal pain: Secondary | ICD-10-CM | POA: Insufficient documentation

## 2017-04-10 DIAGNOSIS — N9489 Other specified conditions associated with female genital organs and menstrual cycle: Secondary | ICD-10-CM | POA: Insufficient documentation

## 2017-05-28 ENCOUNTER — Ambulatory Visit (INDEPENDENT_AMBULATORY_CARE_PROVIDER_SITE_OTHER): Payer: BLUE CROSS/BLUE SHIELD | Admitting: Nurse Practitioner

## 2017-05-28 ENCOUNTER — Telehealth: Payer: Self-pay | Admitting: Nurse Practitioner

## 2017-05-28 ENCOUNTER — Encounter: Payer: Self-pay | Admitting: Nurse Practitioner

## 2017-05-28 VITALS — BP 117/57 | HR 55 | Temp 97.9°F | Resp 16 | Ht 72.0 in | Wt 157.4 lb

## 2017-05-28 DIAGNOSIS — L308 Other specified dermatitis: Secondary | ICD-10-CM

## 2017-05-28 MED ORDER — RESTORE BARRIER CREA: 1.0000 "application " | TOPICAL_CREAM | Freq: Two times a day (BID) | 0 refills | Status: DC

## 2017-05-28 MED ORDER — BACLOFEN 10 MG PO TABS: 10.0000 mg | ORAL_TABLET | Freq: Every day | ORAL | 1 refills | Status: DC | PRN

## 2017-05-28 MED ORDER — ZINC OXIDE 40 % EX OINT: 1.0000 "application " | TOPICAL_OINTMENT | Freq: Two times a day (BID) | CUTANEOUS | 0 refills | Status: DC | PRN

## 2017-05-28 NOTE — Progress Notes (Signed)
I have reviewed this encounter including the documentation in this note and/or discussed this patient with the provider, Wilhelmina McardleLauren Kennedy, AGPCNP-BC. I am certifying that I agree with the content of this note as supervising physician.  Saralyn PilarAlexander Karamalegos, DO [facility] 05/28/2017, 11:40 AM

## 2017-05-28 NOTE — Telephone Encounter (Signed)
Pt needs a refill on baclofen sent to [facility].  His call back number is [phone]

## 2017-05-28 NOTE — Progress Notes (Signed)
Subjective:    Patient ID: Dominic Barnes, male    DOB: Jun 20, 1991, 26 y.o.   MRN: [id number]  Dominic HornRobert G Lazenby is a 26 y.o. male presenting on 05/28/2017 for Hemorrhoids (possible hemorrhoids, swelling, heat in the rectum.)   HPI  Rectal Irritation Feeling of "rawness" in rectal area  in last 3-4 months w/ occasional burning and itching after BM in last 3-4 months. - Has had bleeding w/ dry tissue.  Has not had any since switching to moist wipes (unscented baby wipes), but occasionally still has a little irrigation.  Notes difficulty sitting flat occasionally with irritation. - Occurs independently from prostatitis flares. Pt notes new flare yesterday.  Is noticing improvement gradually w/ pelvic floor PT.   - BP character: 1-2x daily.  No BM yet today.  Only occasional straining (avoiding w/ PT pelvic floor training).  Consistency mostly soft, sometimes harder (Bristol stool scale 3-4). 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  Social History  Substance Use Topics  . Smoking status: Former Smoker    Quit date: 07/21/2012  . Smokeless tobacco: Current User     Comment: quit 5 years /occasional dip  . Alcohol use 1.2 oz/week    2 Cans of beer per week     Comment: socially    Review of Systems Per HPI unless specifically indicated above     Objective:    BP (!) 117/57 (BP Location: Right Arm, Patient Position: Sitting, Cuff Size: Normal)   Pulse (!) 55   Temp 97.9 F (36.6 C) (Oral)   Resp 16   Ht 6' (1.829 m)   Wt 157 lb 6.4 oz (71.4 kg)   BMI 21.35 kg/m   Wt Readings from Last 3 Encounters:  05/28/17 157 lb 6.4 oz (71.4 kg)  03/07/17 156 lb 12.8 oz (71.1 kg)  12/16/16 158 lb (71.7 kg)    Physical Exam  Constitutional: He is oriented to person, place, and time. He appears well-developed and well-nourished. No distress.  HENT:  Head: Normocephalic and atraumatic.  Neck: Normal range of motion. Neck supple.  Genitourinary: Rectum normal.  Rectal exam shows no external hemorrhoid, no internal hemorrhoid, no fissure, no mass and no tenderness. Prostate is enlarged and tender.  Genitourinary Comments: Perianal skin erythematous without bleeding or open skin.  Neurological: He is alert and oriented to person, place, and time.  Skin: Skin is warm and dry.  Psychiatric: He has a normal mood and affect. His behavior is normal. Judgment and thought content normal.     Results for orders placed or performed in visit on 03/07/17  POCT Urinalysis Dipstick  Result Value Ref Range   Color, UA negative    Clarity, UA negative    Glucose, UA negative    Bilirubin, UA negative    Ketones, UA negative    Spec Grav, UA 1.010 1.010 - 1.025   Blood, UA negative    pH, UA 5.0 5.0 - 8.0   Protein, UA negative    Urobilinogen, UA 0.2 0.2 or 1.0 E.U./dL   Nitrite, UA negative    Leukocytes, UA Negative Negative      Assessment & Plan:   Problem List Items Addressed This Visit    None    Visit Diagnoses    Dermatitis associated with moisture    -  Primary Pt w/ active s/sx of perianal skin irritation.  Exam reveals only mild erythema and intact skin.  No rectal abnormalities  noted.    Plan: 1. Protect skin and prevent breakdown and irritation w/ moisture barrier cream. 2. Reduce irritation w/ Desitin(zinc oxide) diaper rash cream.  Use 2 times daily as needed up to 2 weeks per occurrence. 3. Encouraged keeping skin dry and changing out of wet clothing as soon as possible. 4. Follow up as needed in 5-7 days or if recurrence of symptoms that will not resolve.       Meds ordered this encounter  Medications  . ALPRAZolam (XANAX) 1 MG tablet    Sig: Take 1 tablet 30 minutes prior to procedure.  [name] HYDROcodone-acetaminophen (NORCO/VICODIN) 5-325 MG tablet    Sig: 30 minutes prior to procedure    Follow up plan: Return if symptoms worsen or fail to improve.   [name], DNP, AGPCNP-BC Adult Gerontology Primary Care Nurse  Practitioner [facility] 05/28/2017, 8:35 AM

## 2017-05-28 NOTE — Patient Instructions (Addendum)
Dominic ClicheBobby, Thank you for coming in to clinic today.  1. For your rectal irritation: - Use desitin cream for irritation - Use barrier cream to prevent irritation - Stay clean and dry.  Change underwear when wet.   Please schedule a follow-up appointment with Dominic McardleLauren Caralynn Barnes, AGNP. Return if symptoms worsen or fail to improve.    If you have any other questions or concerns, please feel free to call the clinic or send a message through MyChart. You may also schedule an earlier appointment if necessary.  You will receive a survey after today's visit either digitally by e-mail or paper by [employer] mail. Your experiences and feedback matter to us.  Please respond so we know how we are doing as we provide care for you.   Dominic McardleLauren [name], DNP, AGNP-BC Adult Gerontology Nurse Practitioner [facility]

## 2017-07-02 IMAGING — US US SCROTUM
1 series · 14 of 25 positions shown · non-contrast
Comparison: None.

CLINICAL DATA: Chronic bilateral scrotal pain.

EXAM:
SCROTAL ULTRASOUND
DOPPLER ULTRASOUND OF THE TESTICLES
TECHNIQUE: Complete ultrasound examination of the testicles, epididymis, and
other scrotal structures was performed. Color and spectral Doppler
ultrasound were also utilized to evaluate blood flow to the
testicles.

[Series 1: us scrotum · 0.06mm/px · 14 of 83 slices shown]
[im 1/83]
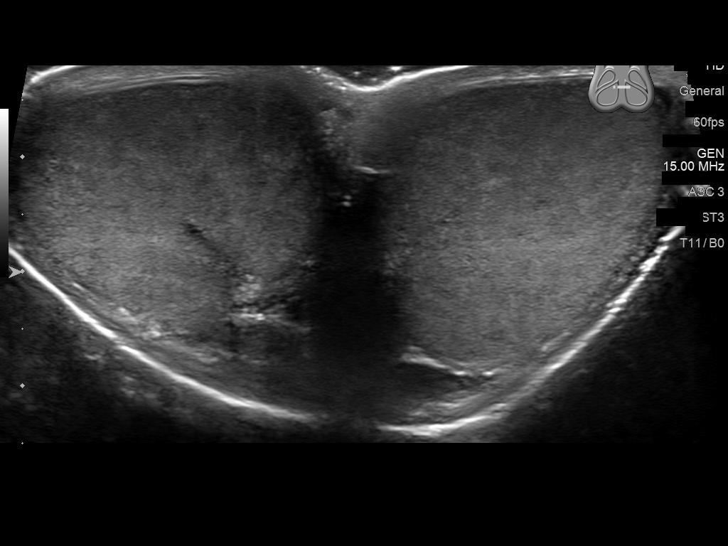
[im 7/83]
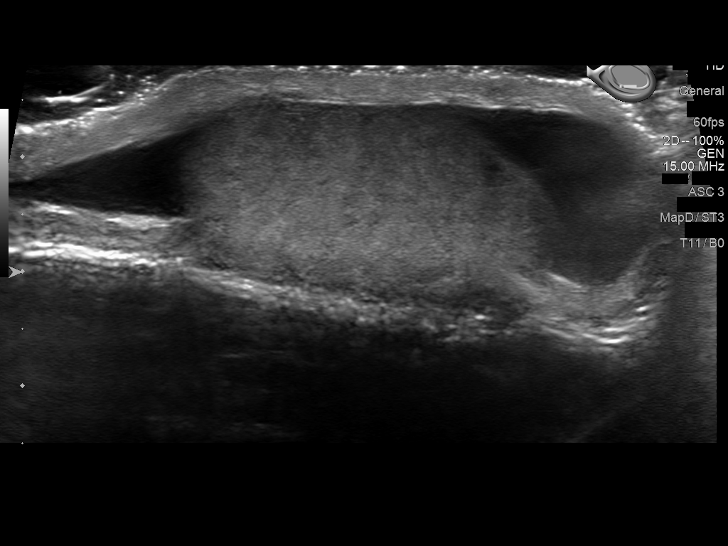
[im 14/83]
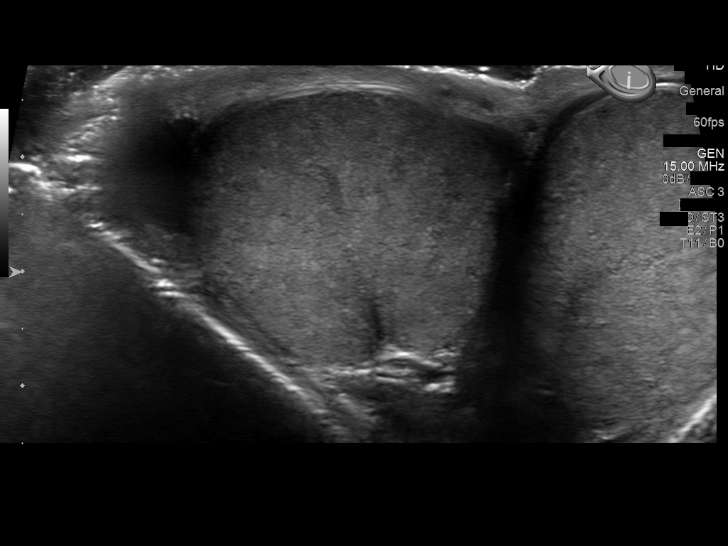
[im 21/83]
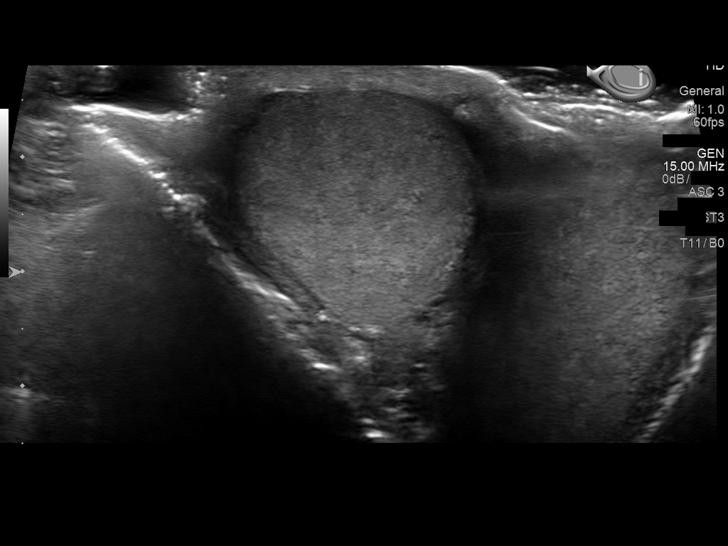
[im 28/83]
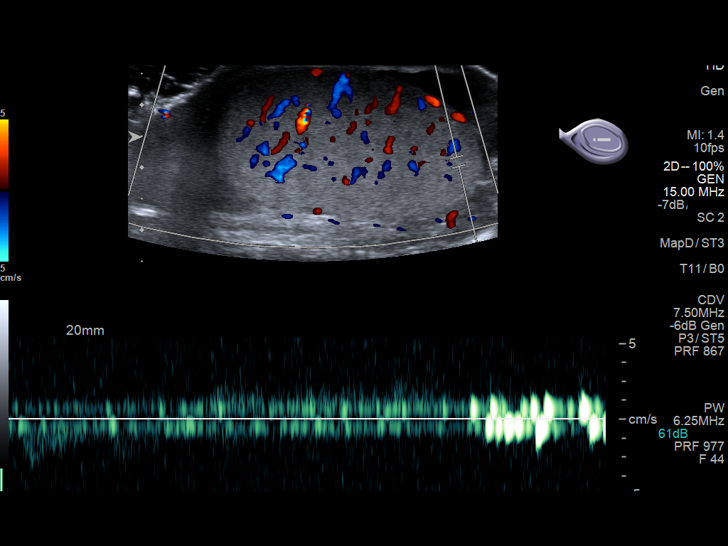
[im 31/83]
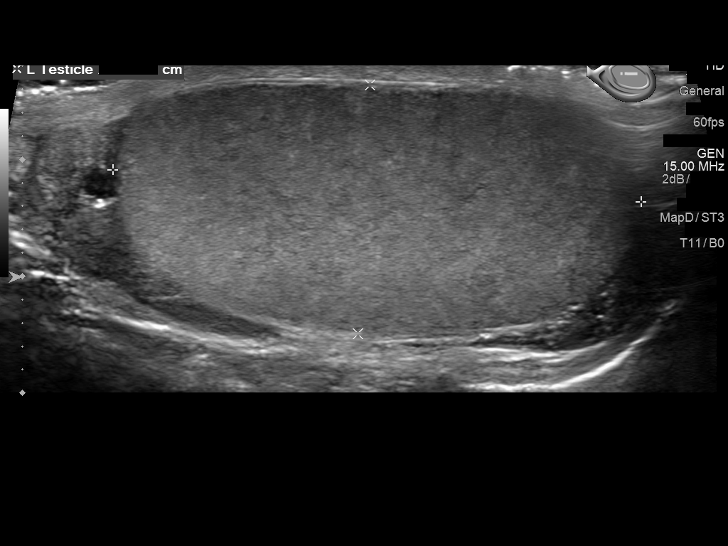
[im 38/83]
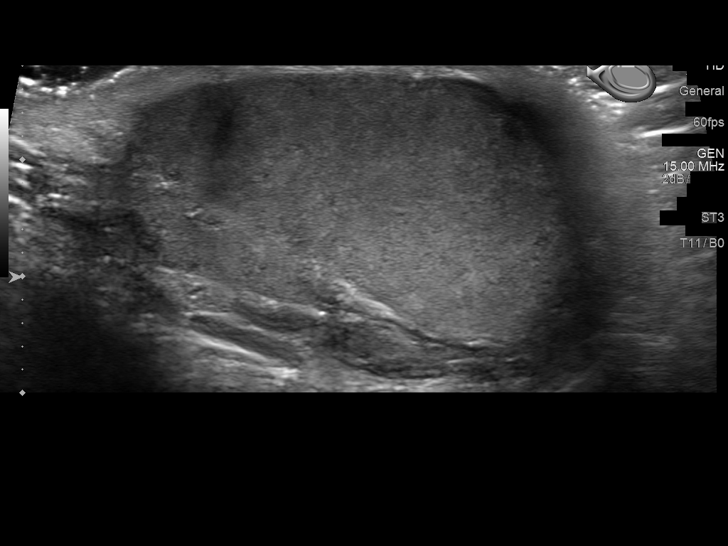
[im 45/83]
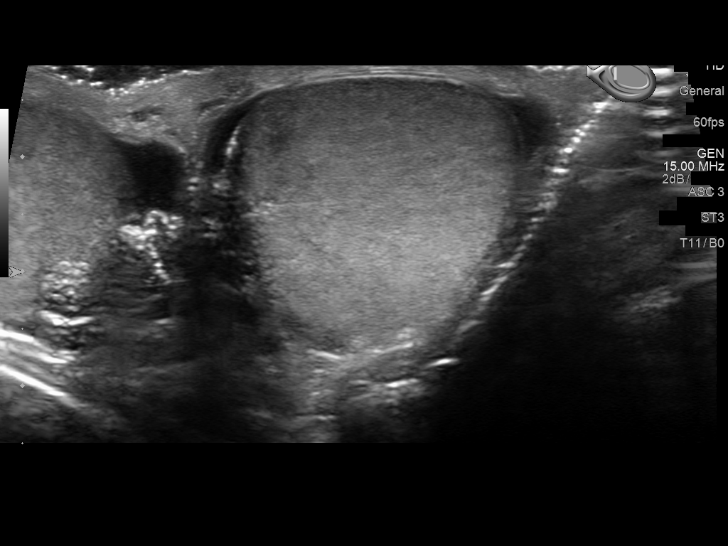
[im 52/83]
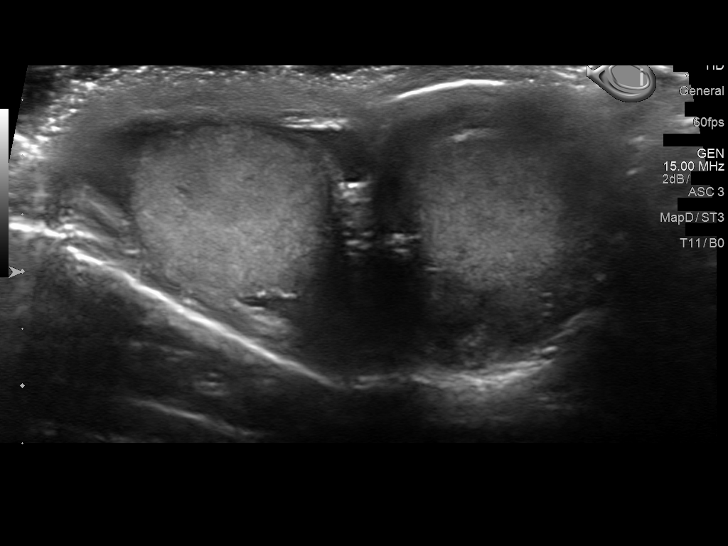
[im 55/83]
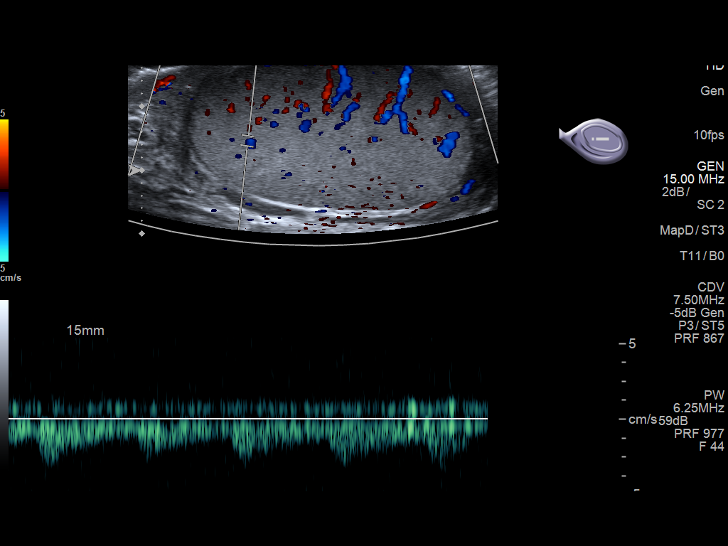
[im 62/83]
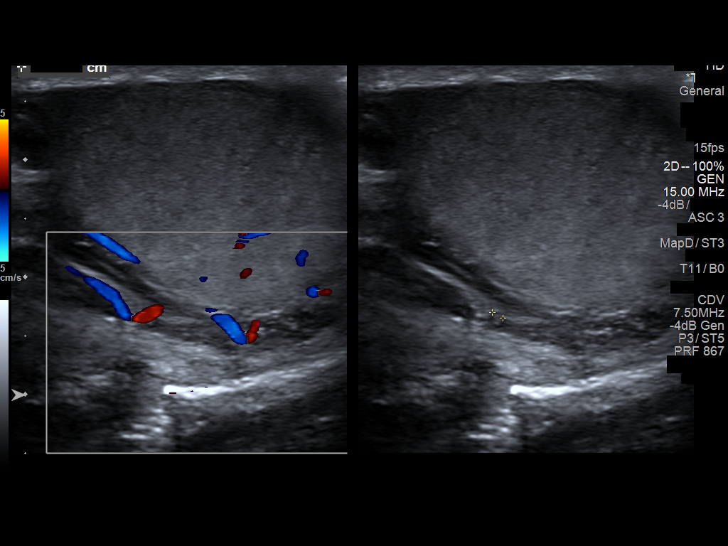
[im 69/83]
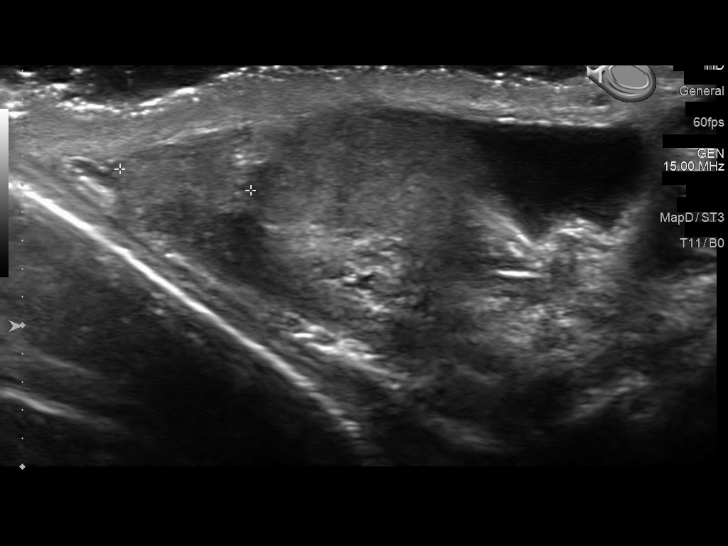
[im 76/83]
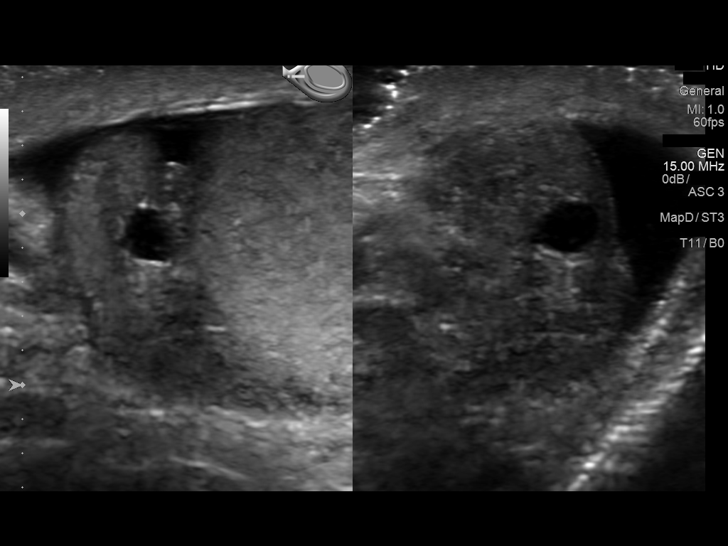
[im 83/83]
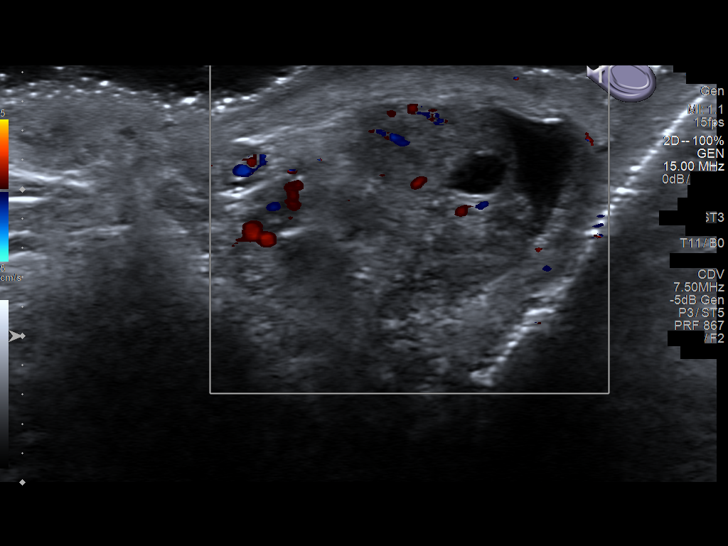

[14 of 25 positions shown; findings below may reference images not displayed]

FINDINGS: Right testicle

Measurements: 4.5 x 2.6 x 2.5 cm. No mass or microlithiasis
visualized.

Left testicle

Measurements: 4.5 x 2.6 x 2.1 cm. No mass or microlithiasis
visualized.

Right epididymis:  Normal in size and appearance.

Left epididymis:  3 mm cyst is noted.

Hydrocele:  Small bilateral hydroceles are noted.

Varicocele:  None visualized.

Pulsed Doppler interrogation of both testes demonstrates normal low
resistance arterial and venous waveforms bilaterally.
IMPRESSION: Small bilateral hydroceles. Small left epididymal cyst. No evidence
of testicular mass or torsion.

## 2017-10-02 ENCOUNTER — Other Ambulatory Visit
Admission: RE | Admit: 2017-10-02 | Discharge: 2017-10-02 | Disposition: A | Discharge status: Home / Self Care | Payer: BLUE CROSS/BLUE SHIELD | Source: Other Acute Inpatient Hospital | Attending: Student | Admitting: Student

## 2017-10-02 DIAGNOSIS — R194 Change in bowel habit: Secondary | ICD-10-CM | POA: Insufficient documentation

## 2017-10-02 LAB — GASTROINTESTINAL PANEL BY PCR, STOOL (REPLACES STOOL CULTURE)
ADENOVIRUS F40/41: NOT DETECTED
Astrovirus: NOT DETECTED
CAMPYLOBACTER SPECIES: NOT DETECTED
CYCLOSPORA CAYETANENSIS: NOT DETECTED
Cryptosporidium: NOT DETECTED
ENTEROAGGREGATIVE E COLI (EAEC): NOT DETECTED
ENTEROPATHOGENIC E COLI (EPEC): NOT DETECTED
Entamoeba histolytica: NOT DETECTED
Enterotoxigenic E coli (ETEC): NOT DETECTED
Giardia lamblia: NOT DETECTED
Norovirus GI/GII: NOT DETECTED
PLESIMONAS SHIGELLOIDES: NOT DETECTED
Rotavirus A: NOT DETECTED
Salmonella species: NOT DETECTED
Sapovirus (I, II, IV, and V): NOT DETECTED
Shiga like toxin producing E coli (STEC): NOT DETECTED
Shigella/Enteroinvasive E coli (EIEC): NOT DETECTED
VIBRIO SPECIES: NOT DETECTED
Vibrio cholerae: NOT DETECTED
Yersinia enterocolitica: NOT DETECTED

## 2017-10-02 LAB — C DIFFICILE QUICK SCREEN W PCR REFLEX
C Diff antigen: NEGATIVE
C Diff interpretation: NOT DETECTED
C Diff toxin: NEGATIVE

## 2017-10-05 LAB — CALPROTECTIN, FECAL: Calprotectin, Fecal: <16 ug/g (ref 0–120)

## 2017-10-06 LAB — PANCREATIC ELASTASE, FECAL: Pancreatic Elastase-1, Stool: >500 ug Elast./g (ref >200)

## 2018-01-22 ENCOUNTER — Encounter: Payer: Self-pay | Admitting: Surgery

## 2018-01-22 ENCOUNTER — Encounter: Payer: Self-pay | Admitting: Nurse Practitioner

## 2018-01-22 ENCOUNTER — Ambulatory Visit: Payer: BLUE CROSS/BLUE SHIELD | Admitting: Nurse Practitioner

## 2018-01-22 ENCOUNTER — Ambulatory Visit: Payer: BLUE CROSS/BLUE SHIELD | Admitting: Surgery

## 2018-01-22 ENCOUNTER — Other Ambulatory Visit: Payer: Self-pay

## 2018-01-22 VITALS — BP 137/88 | HR 66 | Temp 97.7°F | Ht 72.0 in | Wt 150.0 lb

## 2018-01-22 VITALS — BP 130/82 | HR 71 | Temp 97.8°F | Ht 72.0 in | Wt 160.0 lb

## 2018-01-22 DIAGNOSIS — K644 Residual hemorrhoidal skin tags: Secondary | ICD-10-CM | POA: Diagnosis not present

## 2018-01-22 DIAGNOSIS — K642 Third degree hemorrhoids: Secondary | ICD-10-CM | POA: Diagnosis not present

## 2018-01-22 DIAGNOSIS — K648 Other hemorrhoids: Secondary | ICD-10-CM

## 2018-01-22 MED ORDER — HYDROCORTISONE ACETATE 25 MG RE SUPP: 25.0000 mg | Freq: Two times a day (BID) | RECTAL | 0 refills | Status: AC

## 2018-01-22 NOTE — Progress Notes (Signed)
Subjective:    Patient ID: Dominic Barnes, male    DOB: [birth year], 27 y.o.   MRN: [id number]  Dominic Barnes is a 27 y.o. male presenting on 01/22/2018 for Hemorrhoids (x 4 days. Pt state it feels like he's sitting on a golf ball. History of rectal bleeding and other condition. Colonoscopy x 10/2017.)   HPI Hemorrhoids Found internal hemorrhoids on colonoscopy January 2019.  Pt feels he may have external hemorrhoid.  Started Sunday evening (3.5-4 days ago).  He now feels significant pressure at his anus.  This would be patient's first external hemorrhoid. -Patient has history of prostatitis  in the past secondary to increased pelvic tone with pelvic floor physical therapy. - Has continued work on pelvic floor since therapy finished. - OTC treatments: Preparation H multi symptom topically - Is not helping much. - Hot showers/warm water is best for symptom relief.  No tub currently, so has not performed any sitz bath.  Social History   Tobacco Use  . Smoking status: Former Smoker    Last attempt to quit: 07/21/2012    Years since quitting: 5.5  . Smokeless tobacco: Current User  . Tobacco comment: quit 5 years /occasional dip  Substance Use Topics  . Alcohol use: Yes    Alcohol/week: 1.2 oz    Types: 2 Cans of beer per week    Comment: socially  . Drug use: No    Review of Systems Per HPI unless specifically indicated above     Objective:    BP 130/82 (BP Location: Left Arm, Patient Position: Sitting, Cuff Size: Normal)   Pulse 71   Temp 97.8 F (36.6 C) (Oral)   Ht 6' (1.829 m)   Wt 160 lb (72.6 kg)   BMI 21.70 kg/m   Wt Readings from Last 3 Encounters:  01/22/18 160 lb (72.6 kg)  05/28/17 157 lb 6.4 oz (71.4 kg)  03/07/17 156 lb 12.8 oz (71.1 kg)    Physical Exam  Constitutional: He is oriented to person, place, and time. He appears well-developed and well-nourished. No distress.  HENT:  Head: Normocephalic and atraumatic.  Genitourinary: Prostate normal. Rectal  exam shows external hemorrhoid (thrombosed hemorrhoid present at [facility]), internal hemorrhoid (Internal hemorrhoid at 9 o'clock position (Left)) and tenderness. Rectal exam shows no fissure and anal tone normal.     Neurological: He is alert and oriented to person, place, and time.  Skin: Skin is warm and dry.  Psychiatric: He has a normal mood and affect. His behavior is normal. Judgment and thought content normal.  Vitals reviewed.   Results for orders placed or performed during the hospital encounter of 10/02/17  Gastrointestinal Panel by PCR , Stool  Result Value Ref Range   Campylobacter species NOT DETECTED NOT DETECTED   Plesimonas shigelloides NOT DETECTED NOT DETECTED   Salmonella species NOT DETECTED NOT DETECTED   Yersinia enterocolitica NOT DETECTED NOT DETECTED   Vibrio species NOT DETECTED NOT DETECTED   Vibrio cholerae NOT DETECTED NOT DETECTED   Enteroaggregative E coli (EAEC) NOT DETECTED NOT DETECTED   Enteropathogenic E coli (EPEC) NOT DETECTED NOT DETECTED   Enterotoxigenic E coli (ETEC) NOT DETECTED NOT DETECTED   Shiga like toxin producing E coli (STEC) NOT DETECTED NOT DETECTED   Shigella/Enteroinvasive E coli (EIEC) NOT DETECTED NOT DETECTED   Cryptosporidium NOT DETECTED NOT DETECTED   Cyclospora cayetanensis NOT DETECTED NOT DETECTED   Entamoeba histolytica NOT DETECTED NOT DETECTED   Giardia lamblia NOT DETECTED NOT DETECTED  Adenovirus F40/41 NOT DETECTED NOT DETECTED   Astrovirus NOT DETECTED NOT DETECTED   Norovirus GI/GII NOT DETECTED NOT DETECTED   Rotavirus A NOT DETECTED NOT DETECTED   Sapovirus (I, II, IV, and V) NOT DETECTED NOT DETECTED  C difficile quick scan w PCR reflex  Result Value Ref Range   C Diff antigen NEGATIVE NEGATIVE   C Diff toxin NEGATIVE NEGATIVE   C Diff interpretation No C. difficile detected.   Calprotectin, Fecal  Result Value Ref Range   Calprotectin, Fecal <16 0 - 120 ug/g  Pancreatic elastase, fecal  Result Value  Ref Range   Pancreatic Elastase-1, Stool >500 >200 ug Elast./g      Assessment & Plan:   Problem List Items Addressed This Visit    None    Visit Diagnoses    Inflamed external hemorrhoid    -  Primary Acute to subacute external hemorrhoid that remains thrombosed.  Symptom duration slightly over 72 hours with continuing severe pain locally.  Patient with in complete resolution of symptoms using over-the-counter treatments and warm showers.  Unable to perform sitz bath's currently.  Plan: 1.  Urgent referral to general surgery for possible procedure to reduce hemorrhoid. 2.  Start Anusol suppository twice daily for next 5-6 days.  May repeat for repeat hemorrhoid flare. 3.  Continue pelvic floor physical therapy exercises at home to relax sphincter tone. 4.  Follow-up as needed   Relevant Medications   hydrocortisone (ANUSOL-HC) 25 MG suppository   Other Relevant Orders   Ambulatory referral to General Surgery   Internal hemorrhoid     Not currently inflamed on rectal exam.  Currently stable.  Follow-up as needed.   Relevant Medications   hydrocortisone (ANUSOL-HC) 25 MG suppository   Other Relevant Orders   Ambulatory referral to General Surgery      Meds ordered this encounter  Medications  . hydrocortisone (ANUSOL-HC) 25 MG suppository    Sig: Place 1 suppository (25 mg total) rectally 2 (two) times daily for 6 days.    Dispense:  12 suppository    Refill:  0    Order Specific Question:   Supervising Provider    Answer:   Smitty Cords [[id number]]    Follow up plan: Return 1-2 weeks if symptoms worsen or fail to improve.  [name], DNP, AGPCNP-BC Adult Gerontology Primary Care Nurse Practitioner [facility] 01/22/2018, 1:40 PM

## 2018-01-22 NOTE — Progress Notes (Signed)
Surgical Clinic History and Physical  Referring provider:  Galen Manila, NP [address] [place], [place] [address]  HISTORY OF PRESENT ILLNESS (HPI):  27 y.o. male presents for evaluation of hemorrhoids. Patient reports a chronic history of hard and painful BM's, sometimes with loose BM's and other times previously with rectal bleeding, for which colonoscopy was performed this past January. Patient states colonoscopy revealed an internal hemorrhoid, but over the past 4 days he's felt a perianal bulge that he describes as feeling like he's sitting on a golf ball, causing him significant anxiety that he's now developed an external hemorrhoid, for which he presented today to his PMD and is referred accordingly for surgical evaluation. Patient states he has been using preparation H without much relief from the pressure, which he says has been relieved only with warm/hot showers. Patient says he was previously prescribed pelvic floor physical therapy x 6 months, which he describes helped teach him to relax his anal sphincter and improved his difficulty with BM's somewhat. He otherwise denies any blood per rectum, abdominal distention, abdominal pain, N/V, fever/chills, CP, or SOB.  PAST MEDICAL HISTORY (PMH):  Past Medical History:  Diagnosis Date  . Anxiety   . 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 Asthma    exercise induced. 1 episiode. Approx age 59.  [name] Headache    sinus  . Sinus trouble   . Testicle pain      PAST SURGICAL HISTORY [facility]):  Past Surgical History:  Procedure Laterality Date  . ELBOW SURGERY     right  . IMAGE GUIDED SINUS SURGERY N/A 08/30/2015   Procedure: IMAGE GUIDED SINUS SURGERY;  Surgeon: Bud Face, MD;  Location: [facility];  Service: ENT;  Laterality: N/A;  GAVE DISK TO [name] 11/3  . MAXILLARY ANTROSTOMY Bilateral 08/30/2015   Procedure: MAXILLARY ANTROSTOMY;  Surgeon: Bud Face, MD;  Location: [facility];  Service: ENT;   Laterality: Bilateral;  . NASAL SINUS SURGERY    . SEPTOPLASTY N/A 08/30/2015   Procedure: SEPTOPLASTY;  Surgeon: Bud Face, MD;  Location: [facility];  Service: ENT;  Laterality: N/A;  . TONSILLECTOMY    . TURBINATE REDUCTION Bilateral 08/30/2015   Procedure: INFERIOR TURBINATE REDUCTION ;  Surgeon: Bud Face, MD;  Location: [facility];  Service: ENT;  Laterality: Bilateral;  . WISDOM TOOTH EXTRACTION       MEDICATIONS:  Prior to Admission medications   Medication Sig Start Date End Date Taking? Authorizing Provider  alfuzosin (UROXATRAL) 10 MG 24 hr tablet Take 10 mg by mouth. 01/24/17  Yes [provider]  ALPRAZolam Prudy Feeler) 0.5 MG tablet Take by mouth. 06/17/17  Yes [provider]  fluticasone (FLONASE) 50 MCG/ACT nasal spray Place into both nostrils daily. Reported on 11/28/2015   Yes [provider]  HYDROcodone-acetaminophen (NORCO/VICODIN) 5-325 MG tablet 30 minutes prior to procedure 03/25/17  Yes [provider]  hydrocortisone (ANUSOL-HC) 25 MG suppository Place 1 suppository (25 mg total) rectally 2 (two) times daily for 6 days. 01/22/18 01/28/18 Yes Galen Manila, NP  liver oil-zinc oxide (DESITIN) 40 % ointment Apply 1 application topically 2 (two) times daily as needed for irritation. For 2 weeks at a time 05/28/17  Yes Galen Manila, NP  risperiDONE (RISPERDAL) 0.5 MG tablet  07/18/17  Yes [provider]     ALLERGIES:  No Known Allergies   SOCIAL HISTORY:  Social History   Socioeconomic History  . Marital status:  Single    Spouse name: Not on file  . Number of children: Not on file  . Years of education: Not on file  . Highest education level: Not on file  Occupational History  . Not on file  Social Needs  . Financial resource strain: Not on file  . Food insecurity:    Worry: Not on file    Inability: Not on file  . Transportation needs:    Medical: Not on file    Non-medical:  Not on file  Tobacco Use  . Smoking status: Former Smoker    Last attempt to quit: 07/21/2012    Years since quitting: 5.5  . Smokeless tobacco: Current User  . Tobacco comment: quit 5 years /occasional dip  Substance and Sexual Activity  . Alcohol use: Yes    Alcohol/week: 1.2 oz    Types: 2 Cans of beer per week    Comment: socially  . Drug use: No  . Sexual activity: Not on file  Lifestyle  . Physical activity:    Days per week: Not on file    Minutes per session: Not on file  . Stress: Not on file  Relationships  . Social connections:    Talks on phone: Not on file    Gets together: Not on file    Attends religious service: Not on file    Active member of club or organization: Not on file    Attends meetings of clubs or organizations: Not on file    Relationship status: Not on file  . Intimate partner violence:    Fear of current or ex partner: Not on file    Emotionally abused: Not on file    Physically abused: Not on file    Forced sexual activity: Not on file  Other Topics Concern  . Not on file  Social History Narrative  . Not on file    The patient currently resides (home / rehab facility / nursing home): Home The patient normally is (ambulatory / bedbound): Ambulatory  FAMILY HISTORY:  Family History  Problem Relation Age of Onset  . Cirrhosis Father   . Kidney disease Neg Hx   . Prostate cancer Neg Hx     Otherwise negative/non-contributory.  REVIEW OF SYSTEMS:  Constitutional: denies any other weight loss, fever, chills, or sweats  Eyes: denies any other vision changes, history of eye injury  ENT: denies sore throat, hearing problems  Respiratory: denies shortness of breath, wheezing  Cardiovascular: denies chest pain, palpitations  Gastrointestinal: abdominal pain, N/V, and bowel function as per HPI Musculoskeletal: denies any other joint pains or cramps  Skin: Denies any other rashes or skin discolorations Neurological: denies any other headache,  dizziness, weakness  Psychiatric: Denies any other depression, anxiety   All other review of systems were otherwise negative   VITAL SIGNS:  BP 137/88   Pulse 66   Temp 97.7 F (36.5 C) (Oral)   Ht 6' (1.829 m)   Wt 150 lb (68 kg)   BMI 20.34 kg/m   PHYSICAL EXAM:  Constitutional:  -- Normal body habitus  -- Awake, alert, and oriented x3  Eyes:  -- Pupils equally round and reactive to light  -- No scleral icterus  Ear, nose, throat:  -- No jugular venous distension -- No nasal drainage, bleeding Pulmonary:  -- No crackles  -- Equal breath sounds bilaterally -- Breathing non-labored at rest Cardiovascular:  -- S1, S2 present  -- No pericardial rubs  Gastrointestinal:  --  Abdomen soft, nontender, non-distended, no guarding/rebound  -- No abdominal masses appreciated, pulsatile or otherwise  Anorectal: -- Tight anal sphincter tone with feces in rectum with no gross blood -- Reducible prolapsed purplish-colored, but soft and spongy, non-thrombosed internal hemorrhoid at 8 o'clock position Musculoskeletal and Integumentary:  -- Wounds or skin discoloration: None appreciated -- Extremities: B/L UE and LE FROM, hands and feet warm, no edema  Neurologic:  -- Motor function: Intact and symmetric -- Sensation: Intact and symmetric  Labs: CBC: No results found for: WBC, RBC BMP: No results found for: GLUCOSE, POTASSIUM, CHLORIDE, CO2, BUN, CREATININE, CALCIUM   Imaging studies: no new pertinent imaging studies available for review  Assessment/Plan:  26 y.o. male with acute anal pressure and pain attributed to reducible prolapsed purplish-colored, but soft and spongy, non-thrombosed internal hemorrhoid at 8 o'clock position, complicated by co-morbidities including anxiety disorder.   - 5% topical lidocaine cream to anus prn for pain relief   - considering patient's difficulty with anal sphincter relaxation, topical nifedipine cream and pelvic floor exercises  - anusol  topical steroid prn for relief from hemorrhoidal inflammation, but cream tends to cost less than and equally as effective as the often much more expensive suppositories  - once daily colace stool softener advised to prevent constipation with Miralax prn to relieve existing constipation  - sitz baths up to TID for symptomatic relief as schedule permits, at least BID (morning/night) if not able TID  - if symptoms not relieved despite medical management and bowel regimen, hemorrhoidectomy discussed  - return to clinic in 2 weeks, instructed to call if any questions or concerns  All of the above recommendations were discussed with the patient, and all of patient's questions were answered to his expressed satisfaction.  Thank you for the opportunity to participate in this patient's care.  -- Scherrie Gerlach Earlene Plater, MD, RPVI [place]: [facility] - Partnering for exceptional care. Office: [phone]

## 2018-01-22 NOTE — Patient Instructions (Addendum)
Dominic Barnes,   Thank you for coming in to clinic today.  1. General Surgery referral urgently for possible decompression. - Continue work on relaxing pelvic floor - START anusol suppository twice daily for 5-6 days - START tucks pads as needed OTC.  Please schedule a follow-up appointment with Wilhelmina McardleLauren Traeger Sultana, AGNP. Return 1-2 weeks if symptoms worsen or fail to improve.  If you have any other questions or concerns, please feel free to call the clinic or send a message through MyChart. You may also schedule an earlier appointment if necessary.  You will receive a survey after today's visit either digitally by e-mail or paper by [employer] mail. Your experiences and feedback matter to us.  Please respond so we know how we are doing as we provide care for you.   Wilhelmina McardleLauren Analyah Mcconnon, DNP, AGNP-BC Adult Gerontology Nurse Practitioner [facility], [facility]    Hemorrhoids Hemorrhoids are swollen veins in and around the rectum or anus. There are two types of hemorrhoids:  Internal hemorrhoids. These occur in the veins that are just inside the rectum. They may poke through to the outside and become irritated and painful.  External hemorrhoids. These occur in the veins that are outside of the anus and can be felt as a painful swelling or hard lump near the anus.  Most hemorrhoids do not cause serious problems, and they can be managed with home treatments such as diet and lifestyle changes. If home treatments do not help your symptoms, procedures can be done to shrink or remove the hemorrhoids. What are the causes? This condition is caused by increased pressure in the anal area. This pressure may result from various things, including:  Constipation.  Straining to have a bowel movement.  Diarrhea.  Pregnancy.  Obesity.  Sitting for long periods of time.  Heavy lifting or other activity that causes you to strain.  Anal sex.  What are the signs or symptoms? Symptoms of this  condition include:  Pain.  Anal itching or irritation.  Rectal bleeding.  Leakage of stool (feces).  Anal swelling.  One or more lumps around the anus.  How is this diagnosed? This condition can often be diagnosed through a visual exam. Other exams or tests may also be done, such as:  Examination of the rectal area with a gloved hand (digital rectal exam).  Examination of the anal canal using a small tube (anoscope).  A blood test, if you have lost a significant amount of blood.  A test to look inside the colon (sigmoidoscopy or colonoscopy).  How is this treated? This condition can usually be treated at home. However, various procedures may be done if dietary changes, lifestyle changes, and other home treatments do not help your symptoms. These procedures can help make the hemorrhoids smaller or remove them completely. Some of these procedures involve surgery, and others do not. Common procedures include:  Rubber band ligation. Rubber bands are placed at the base of the hemorrhoids to cut off the blood supply to them.  Sclerotherapy. Medicine is injected into the hemorrhoids to shrink them.  Infrared coagulation. A type of light energy is used to get rid of the hemorrhoids.  Hemorrhoidectomy surgery. The hemorrhoids are surgically removed, and the veins that supply them are tied off.  Stapled hemorrhoidopexy surgery. A circular stapling device is used to remove the hemorrhoids and use staples to cut off the blood supply to them.  Follow these instructions at home: Eating and drinking  Eat foods that  have a lot of fiber in them, such as whole grains, beans, nuts, fruits, and vegetables. Ask your health care provider about taking products that have added fiber (fiber supplements).  Drink enough fluid to keep your urine clear or pale yellow. Managing pain and swelling  Take warm sitz baths for 20 minutes, 3-4 times a day to ease pain and discomfort.  If directed, apply  ice to the affected area. Using ice packs between sitz baths may be helpful. ? Put ice in a plastic bag. ? Place a towel between your skin and the bag. ? Leave the ice on for 20 minutes, 2-3 times a day. General instructions  Take over-the-counter and prescription medicines only as told by your health care provider.  Use medicated creams or suppositories as told.  Exercise regularly.  Go to the bathroom when you have the urge to have a bowel movement. Do not wait.  Avoid straining to have bowel movements.  Keep the anal area dry and clean. Use wet toilet paper or moist towelettes after a bowel movement.  Do not sit on the toilet for long periods of time. This increases blood pooling and pain. Contact a health care provider if:  You have increasing pain and swelling that are not controlled by treatment or medicine.  You have uncontrolled bleeding.  You have difficulty having a bowel movement, or you are unable to have a bowel movement.  You have pain or inflammation outside the area of the hemorrhoids. This information is not intended to replace advice given to you by your health care provider. Make sure you discuss any questions you have with your health care provider. Document Released: 10/04/2000 Document Revised: 03/06/2016 Document Reviewed: 06/21/2015 Elsevier Interactive Patient Education  [employer].

## 2018-01-22 NOTE — Patient Instructions (Signed)
Please try to keep yourself well hydrated.  Try to do the sitz baths at least 2 times a day (morning and evening).  Please start taking over-the-counter stool softeners 1 tablet once a day.  Please start taking Miralax 17 G once a day.  Please go to [facility] in [place] to pick up your prescription (rectal cream).   Disposable Sitz Bath A disposable sitz bath is a plastic basin that fits over the toilet. A bag is hung above the toilet, and the bag is connected to a tube that opens into the basin. The bag is filled with warm water that flows into the basin through the tube. A sitz bath can be used to help relieve symptoms, clean, and promote healing in the genital and anal areas, as well as in the lower abdomen and buttocks. What are the risks? Sitz baths are generally very safe. It is possible for the skin between the genitals and the anus (perineum) to become infected, but this is rare. You can avoid this by cleaning your sitz bath supplies thoroughly. How to use a disposable sitz bath 1. Close the clamp on the tube. Make sure the clamp is closed tightly to prevent leakage. 2. Fill the sitz bath basin and the plastic bag with warm water. The water should be warm enough to be comfortable, but not hot. 3. Raise the toilet seat and place the filled basin on the toilet. Make sure the overflow opening is facing toward the back of the toilet. ? If you prefer, you may place the empty basin on the toilet first, and then use the plastic bag to fill the basin with warm water. 4. Hang the filled plastic bag overhead on a hook or towel rack close to the toilet. The bag should be higher than the toilet so that the water will flow down through the tube. 5. Attach the tube to the opening on the basin. Make sure that the tube is attached to the basin tightly to prevent leakage. 6. Sit on the basin and release the clamp. This will allow warm water to flow into the basin and flush the area around your  genitals and anus. 7. Remain sitting on the basin for about 15-20 minutes, or as long as told by your health care provider. 8. Stand up and gently pat your skin dry. If directed, apply clean bandages (dressings) to the affected area as told by your health care provider. 9. Carefully remove the basin from the toilet seat and tip the basin into the toilet to empty any remaining water. Empty any remaining water from the plastic bag into the toilet. Then, flush the toilet. 10. Wash the basin with warm water and soap. Let the basin air dry in the sink. You should also let the plastic bag and the tubing air dry. 11. Store the basin, tubing, and plastic bag in a clean, dry area. 12. Wash your hands with soap and water. If soap and water are not available, use hand sanitizer. Contact a health care provider if:  You have symptoms that get worse instead of better.  You develop new skin irritation, redness, or swelling around your genitals or anus. This information is not intended to replace advice given to you by your health care provider. Make sure you discuss any questions you have with your health care provider. Document Released: 04/07/2012 Document Revised: 03/14/2016 Document Reviewed: 08/27/2015 Elsevier Interactive Patient Education  [employer]2018 Else[employer] Inc.

## 2018-02-12 ENCOUNTER — Ambulatory Visit: Payer: BLUE CROSS/BLUE SHIELD | Admitting: Surgery

## 2018-02-12 ENCOUNTER — Encounter: Payer: Self-pay | Admitting: Surgery

## 2018-02-12 VITALS — BP 145/86 | HR 70 | Temp 98.0°F | Ht 72.0 in | Wt 161.8 lb

## 2018-02-12 DIAGNOSIS — K642 Third degree hemorrhoids: Secondary | ICD-10-CM | POA: Diagnosis not present

## 2018-02-12 DIAGNOSIS — K645 Perianal venous thrombosis: Secondary | ICD-10-CM | POA: Insufficient documentation

## 2018-02-12 DIAGNOSIS — N9489 Other specified conditions associated with female genital organs and menstrual cycle: Secondary | ICD-10-CM

## 2018-02-12 NOTE — Patient Instructions (Addendum)
Follow up with Dr Markham Jordan.  Once you have had your visit with him and are ready for surgery call us to schedule your hemorrhoidectomy.    Surgical Procedures for Hemorrhoids Surgical procedures can be used to treat hemorrhoids. Hemorrhoids are swollen veins that are inside the rectum (internal hemorrhoids) or around the anus (external hemorrhoids). They are caused by increased pressure in the anal area. This pressure may result from straining to have a bowel movement (constipation), diarrhea, pregnancy, obesity, anal sex, or sitting for long periods of time. Hemorrhoids can cause symptoms such as pain and bleeding. Surgery may be needed if diet changes, lifestyle changes, and other treatments do not help your symptoms. Various surgical methods may be used. Three common methods are:  Closed hemorrhoidectomy. The hemorrhoids are surgically removed, and the surgical cuts (incisions) are closed with stitches (sutures).  Open hemorrhoidectomy. The hemorrhoids are surgically removed, but the incisions are allowed to heal without sutures.  Stapled hemorrhoidopexy. The hemorrhoids are removed using a device that takes out a ring of excess tissue.  Tell a health care provider about:  Any allergies you have.  All medicines you are taking, including vitamins, herbs, eye drops, creams, and over-the-counter medicines.  Any problems you or family members have had with anesthetic medicines.  Any blood disorders you have.  Any surgeries you have had.  Any medical conditions you have.  Whether you are pregnant or may be pregnant. What are the risks? Generally, this is a safe procedure. However, problems may occur, including:  Infection.  Bleeding.  Allergic reactions to medicines.  Damage to other structures or organs.  Pain.  Constipation.  Difficulty passing urine.  Narrowing of the anal canal (stenosis).  Difficulty controlling bowel movements (incontinence).  What happens before  the procedure?  Ask your health care provider about: ? Changing or stopping your regular medicines. This is especially important if you are taking diabetes medicines or blood thinners. ? Taking medicines such as aspirin and ibuprofen. These medicines can thin your blood. Do not take these medicines before your procedure if your health care provider instructs you not to.  You may need to have a procedure to examine the inside of your colon with a scope (colonoscopy). Your health care provider may do this to make sure that there are no other causes for your bleeding or pain.  Follow instructions from your health care provider about eating or drinking restrictions.  You may be instructed to take a laxative and an enema to clean out your colon before surgery (bowel prep). Carefully follow instructions from your health care provider about bowel prep.  Ask your health care provider how your surgical site will be marked or identified.  You may be given antibiotic medicine to help prevent infection.  Plan to have someone take you home after the procedure. What happens during the procedure?  To reduce your risk of infection: ? Your health care team will wash or sanitize their hands. ? Your skin will be washed with soap.  An IV tube will be inserted into one of your veins.  You will be given one or more of the following: ? A medicine to help you relax (sedative). ? A medicine to numb the area (local anesthetic). ? A medicine to make you fall asleep (general anesthetic). ? A medicine that is injected into an area of your body to numb everything below the injection site (regional anesthetic).  A lubricating jelly may be placed into your rectum.  Your surgeon  will insert a short scope (anoscope) into your rectum to examine the hemorrhoids.  One of the following hemorrhoid procedures will be performed. Closed Hemorrhoidectomy  Your surgeon will use surgical instruments to open the tissue  around the hemorrhoids.  The veins that supply the hemorrhoids will be tied off with a suture.  The hemorrhoids will be removed.  The tissue that surrounds the hemorrhoids will be closed with sutures that your body can absorb (absorbable sutures). Open Hemorrhoidectomy  The hemorrhoids will be removed with surgical instruments.  The incisions will be left open to heal without sutures. Stapled Hemorrhoidopexy  Your surgeon will use a circular stapling device to remove the hemorrhoids.  The device will be inserted into your anus. It will remove a circular ring of tissue that includes hemorrhoid tissue and some tissue above the hemorrhoids.  The staples in the device will close the edges of removed tissue. This will cut off the blood supply to the hemorrhoids and will pull any remaining hemorrhoids back into place. Each of these procedures may vary among health care providers and hospitals. What happens after the procedure?  Your blood pressure, heart rate, breathing rate, and blood oxygen level will be monitored often until the medicines you were given have worn off.  You will be given pain medicine as needed. This information is not intended to replace advice given to you by your health care provider. Make sure you discuss any questions you have with your health care provider. Document Released: 08/04/2009 Document Revised: 03/14/2016 Document Reviewed: 01/02/2015 Elsevier Interactive Patient Education  [employer]2018 Else[employer].

## 2018-02-12 NOTE — Progress Notes (Signed)
Surgical Clinic Progress/Follow-up Note   HPI:  27 y.o. Male presents to clinic for follow-up evaluation of hemorrhoidal pain. Patient reports his perianal discomfort has returned to his baseline, specifically mild-/moderate- perianal pain with BM's and the chronic ongoing sensation of post-defecation "fullness" and "incomplete emptying" without the recently severe pain he was experiencing that prompted his last appointment. Patient says he has been using combined lidocaine / nifedipine cream, describes chronic abdominal cramping, and denies blood per rectum, fever/chills, N/V, CP, or SOB.  Review of Systems:  Constitutional: denies any other weight loss, fever, chills, or sweats  Eyes: denies any other vision changes, history of eye injury  ENT: denies sore throat, hearing problems  Respiratory: denies shortness of breath, wheezing  Cardiovascular: denies chest pain, palpitations  Gastrointestinal: abdominal pain, N/V, and bowel function as per HPI Musculoskeletal: denies any other joint pains or cramps  Skin: Denies any other rashes or skin discolorations  Neurological: denies any other headache, dizziness, weakness  Psychiatric: denies any other depression, anxiety  All other review of systems: otherwise negative   Vital Signs:  BP (!) 145/86   Pulse 70   Temp 98 F (36.7 C) (Oral)   Ht 6' (1.829 m)   Wt 161 lb 12.8 oz (73.4 kg)   BMI 21.94 kg/m    Physical Exam:  Constitutional:  -- Normal body habitus  -- Awake, alert, and oriented x3  Eyes:  -- Pupils equally round and reactive to light  -- No scleral icterus  Ear, nose, throat:  -- No jugular venous distension  -- No nasal drainage, bleeding Pulmonary:  -- No crackles -- Equal breath sounds bilaterally -- Breathing non-labored at rest Cardiovascular:  -- S1, S2 present  -- No pericardial rubs  Gastrointestinal:  -- Soft, nontender, non-distended, no guarding/rebound  -- No abdominal masses appreciated,  pulsatile or otherwise  Anorectal: -- Persistently tight anal sphincter tone -- No further prolapsed purplish-discolored hemorrhoid, no gross blood Musculoskeletal / Integumentary:  -- Wounds or skin discoloration: None appreciated  -- Extremities: B/L UE and LE FROM, hands and feet warm, no edema  Neurologic:  -- Motor function: intact and symmetric  -- Sensation: intact and symmetric   Assessment:  27 y.o. yo Male with a problem list including...  Patient Active Problem List   Diagnosis Date Noted  . High-tone pelvic floor dysfunction 04/10/2017  . Pelvic pain in male 03/12/2017  . Chronic prostatitis 03/09/2017  . Depression with anxiety 08/05/2016  . Pain with ejaculation 11/28/2015  . Anxiety 11/28/2015  . Sinus disorder 11/08/2015  . Dysesthesia 11/08/2015    presents to clinic for follow-up evaluation, feeling overall better with prescribed recommendations, ongoing post-defecation "fullness", IBD-associated cramping, and tight anal sphincter tone despite chronic pelvic floor relaxation exercises.  Plan:   - risks, benefits, and alternatives to no further intervention vs  hemorrhoidectomy vs consideration for anal sphincter botox injection vs GI follow-up +/- surgery were discussed  - patient expresses his wish to proceed with hemorrhoidectomy for his baseline symptoms regardless of improved acute symptoms, but he requests opportunity to follow-up with his GI physician (Dr. Mechele Collin) to address other baseline difficulties (IBD, sphincter tightness) prior to surgery with hopes of improving his baseline and post-surgical recovery  - patient states he will call to schedule hemorrhoidectomy following GI follow-up  - meanwhile, continue hydration, high fiber diet, and current management  - anticipate return to clinic 2 weeks following hemorrhoidectomy  - instructed to call office if any questions or concerns  All of the above recommendations were discussed with the patient, and all  of patient's questions were answered to his expressed satisfaction.  -- Scherrie GerlachJason E. Earlene Plateravis, MD, RPVI [place]: [facility] - Partnering for exceptional care. Office: [phone]

## 2018-06-11 ENCOUNTER — Other Ambulatory Visit: Payer: Self-pay

## 2018-06-11 ENCOUNTER — Encounter: Payer: Self-pay | Admitting: Nurse Practitioner

## 2018-06-11 ENCOUNTER — Ambulatory Visit: Payer: BLUE CROSS/BLUE SHIELD | Admitting: Nurse Practitioner

## 2018-06-11 VITALS — BP 124/75 | HR 79 | Temp 98.5°F | Ht 72.0 in | Wt 153.0 lb

## 2018-06-11 DIAGNOSIS — R1030 Lower abdominal pain, unspecified: Secondary | ICD-10-CM | POA: Diagnosis not present

## 2018-06-11 DIAGNOSIS — G8929 Other chronic pain: Secondary | ICD-10-CM

## 2018-06-11 DIAGNOSIS — K582 Mixed irritable bowel syndrome: Secondary | ICD-10-CM | POA: Diagnosis not present

## 2018-06-11 DIAGNOSIS — F419 Anxiety disorder, unspecified: Secondary | ICD-10-CM | POA: Diagnosis not present

## 2018-06-11 DIAGNOSIS — M545 Low back pain, unspecified: Secondary | ICD-10-CM

## 2018-06-11 LAB — POCT URINALYSIS DIPSTICK
Bilirubin, UA: NEGATIVE
Blood, UA: NEGATIVE
Glucose, UA: NEGATIVE
Ketones, UA: NEGATIVE
Leukocytes, UA: NEGATIVE
Nitrite, UA: NEGATIVE
Protein, UA: NEGATIVE
Spec Grav, UA: 1.01 (ref 1.010–1.025)
Urobilinogen, UA: 0.2 E.U./dL
pH, UA: 6.5 (ref 5.0–8.0)

## 2018-06-11 MED ORDER — RISPERIDONE 0.5 MG PO TABS: 0.5000 mg | ORAL_TABLET | Freq: Every day | ORAL | 2 refills | Status: DC

## 2018-06-11 NOTE — Progress Notes (Signed)
Subjective:    Patient ID: Dominic Barnes, male    DOB: 1991-10-05, 27 y.o.   MRN: [id number]  Dominic Barnes is a 27 y.o. male presenting on 06/11/2018 for Back Pain (bilateral flank pain,  intermittent pubic pain, inferior to the navel x 5 days )   HPI Back Pain Has had prostate inflammation, gastroenterologist with negative results and dx of IBS.  He is now having intermittent lower abdominal pelvic pain/pressure with swelling in the area beneath umbilicus.  He is also having mild lower back pain/flank pain.   - He is noticing associated change in urination.  Drinks lots of water, doesn't feel urge to void as expected.  Voids anyway and often has dark yellow and cloudy urine. - has had associated chills even with hot environments.  IBS Patient has known diagnosis of IBS as diagnosed after extensive negative workup with GI.  Patient was started on dicyclomine 10 mg tid without significant relief.  He was offered option to increase to 20 mg if needed and has not.  He also did not followup with them in 03/2018 as requested by Dr. Mechele Collin [facility] clinic).   Anxiety Patient reports he has not been to see his psychiatrist in many months. Has been off medications for at least 6 months.  Was taking xanax for panic attacks with risperdal 0.5 mg at bedtime for anxiety management as he had previously failed multiple SSRIs and SNRIs.  Patient reports difficulty with transportation to his in-network psychiatrist in [place].  Patient may have in-network availability at [facility]).  Patient verbalizes he is not currently interested in counseling.  - Patient has been having significantly more anxiety and panic attacks in recent weeks. He does not expand on causes or how often these symptoms are occurring.  Is noting worsening GI cramping with higher anxiety.  Social History   Tobacco Use  . Smoking status: Former Smoker    Last attempt to quit: 07/21/2012    Years since quitting: 5.8  . Smokeless  tobacco: Current User  . Tobacco comment: quit 5 years /occasional dip  Substance Use Topics  . Alcohol use: Yes    Alcohol/week: 2.0 standard drinks    Types: 2 Cans of beer per week    Comment: socially  . Drug use: No    Review of Systems Per HPI unless specifically indicated above     Objective:    BP 124/75 (BP Location: Left Arm, Patient Position: Sitting, Cuff Size: Normal)   Pulse 79   Temp 98.5 F (36.9 C) (Oral)   Ht 6' (1.829 m)   Wt 153 lb (69.4 kg)   BMI 20.75 kg/m   Wt Readings from Last 3 Encounters:  06/11/18 153 lb (69.4 kg)  02/12/18 161 lb 12.8 oz (73.4 kg)  01/22/18 150 lb (68 kg)    Physical Exam  Constitutional: He is oriented to person, place, and time. He appears well-developed and well-nourished. No distress.  HENT:  Head: Normocephalic and atraumatic.  Neck: Normal range of motion.  Cardiovascular: Normal rate, regular rhythm, S1 normal, S2 normal, normal heart sounds and intact distal pulses.  Pulmonary/Chest: Effort normal and breath sounds normal. No respiratory distress.  Abdominal: Soft. He exhibits no distension. Bowel sounds are increased. There is no hepatosplenomegaly. There is tenderness in the right lower quadrant, suprapubic area and left lower quadrant. There is no rigidity, no rebound, no guarding, no CVA tenderness, no tenderness at McBurney's point and negative Murphy's sign. No hernia.  Neurological: He is alert and oriented to person, place, and time.  Skin: Skin is warm and dry.  Psychiatric: He has a normal mood and affect. His behavior is normal.  Vitals reviewed.  Results for orders placed or performed in visit on 06/11/18  POCT Urinalysis Dipstick  Result Value Ref Range   Color, UA yellow    Clarity, UA clear    Glucose, UA Negative Negative   Bilirubin, UA negative    Ketones, UA negative    Spec Grav, UA 1.010 1.010 - 1.025   Blood, UA negative    pH, UA 6.5 5.0 - 8.0   Protein, UA Negative Negative    Urobilinogen, UA 0.2 0.2 or 1.0 E.U./dL   Nitrite, UA negative    Leukocytes, UA Negative Negative   Appearance CLEAR    Odor        Assessment & Plan:   Problem List Items Addressed This Visit      Digestive   IBS (irritable bowel syndrome)     Other   Anxiety   Relevant Medications   risperiDONE (RISPERDAL) 0.5 MG tablet   Back pain    Other Visit Diagnoses    Lower abdominal pain    -  Primary   Relevant Orders   POCT Urinalysis Dipstick (Completed)      # Back Pain, IBS, abdominal pain Patient with acute worsening of chronic back pain associated with worsening of abdominal cramping and IBS symptoms.  Patient with known history of IBS.  Likely worsened recently with poorly managed anxiety.  Patient with history of chronic prostatitis which may be worsening symptoms.  Unlikely UTI with negative urine dip today. - Continue followup with GI - Increase dicyclomine to 20 mg q6 hr as instructed by GI at last visit.  - START FD gard once daily OTC - Manage anxiety as below. - Followup as needed and in 3 months.   #Anxiety Currently not well managed with anxiety noted on exam today as well as patient admission of return of panic attacks.  Off medications x 6 months.   - Resume risperdal 0.5 mg once daily at bedtime.   - Followup with Psychiatry.  [facility] for [facility] convenient location with limited transportation. - If not connected with psychiatry, followup here at annual physical in 2 months.  We will need to screen thyroid function as this has not been done in recent months given current symptoms.  Patient defers to physical at this time.   Meds ordered this encounter  Medications  . risperiDONE (RISPERDAL) 0.5 MG tablet    Sig: Take 1 tablet (0.5 mg total) by mouth at bedtime.    Dispense:  30 tablet    Refill:  2    Order Specific Question:   Supervising Provider    Answer:   Smitty CordsKARAMALEGOS, ALEXANDER J [[id number]]    Follow up plan: Return in about 2  months (around 08/11/2018) for annual physical.  Wilhelmina McardleLauren Nielle Duford, DNP, AGPCNP-BC Adult Gerontology Primary Care Nurse Practitioner [facility] 06/11/2018, 3:36 PM

## 2018-06-11 NOTE — Patient Instructions (Addendum)
Dominic Barnes,   Thank you for coming in to clinic today.  1. Dr Toni Amendlapacs, Dr Garnetta BuddyFaheem, Dr Elna BreslowEappen are all doctors at [facility].  Call your insurance to see if they are in network.  Otherwise, Please return to your [facility]. - Resume your risperdal 0.5 mg once daily  2. IBgard over the counter for your IBS.   - Try your 20 mg dicyclomine. - Then, call GI for followup. - [facility] drug class, something like despiramine may be beneficial if they are willing to discuss this and try it with you.  Please schedule a follow-up appointment with Dominic Barnes, AGNP. Return in about 2 months (around 08/11/2018) for annual physical.  If you have any other questions or concerns, please feel free to call the clinic or send a message through MyChart. You may also schedule an earlier appointment if necessary.  You will receive a survey after today's visit either digitally by e-mail or paper by [employer] mail. Your experiences and feedback matter to us.  Please respond so we know how we are doing as we provide care for you.   Dominic McardleLauren Temesha Queener, DNP, AGNP-BC Adult Gerontology Nurse Practitioner [facility], [facility]

## 2018-06-12 ENCOUNTER — Encounter: Payer: Self-pay | Admitting: Nurse Practitioner

## 2018-06-12 DIAGNOSIS — M549 Dorsalgia, unspecified: Secondary | ICD-10-CM | POA: Insufficient documentation

## 2018-06-12 DIAGNOSIS — K589 Irritable bowel syndrome without diarrhea: Secondary | ICD-10-CM | POA: Insufficient documentation

## 2018-08-10 ENCOUNTER — Other Ambulatory Visit: Payer: Self-pay | Admitting: Nurse Practitioner

## 2018-08-10 DIAGNOSIS — F419 Anxiety disorder, unspecified: Secondary | ICD-10-CM

## 2018-08-11 ENCOUNTER — Encounter: Payer: Self-pay | Admitting: Nurse Practitioner

## 2018-08-12 ENCOUNTER — Ambulatory Visit (INDEPENDENT_AMBULATORY_CARE_PROVIDER_SITE_OTHER): Payer: BLUE CROSS/BLUE SHIELD | Admitting: Nurse Practitioner

## 2018-08-12 ENCOUNTER — Encounter: Payer: Self-pay | Admitting: Nurse Practitioner

## 2018-08-12 ENCOUNTER — Other Ambulatory Visit: Payer: Self-pay

## 2018-08-12 VITALS — BP 122/65 | HR 79 | Temp 98.6°F | Ht 72.0 in | Wt 160.4 lb

## 2018-08-12 DIAGNOSIS — Z23 Encounter for immunization: Secondary | ICD-10-CM | POA: Diagnosis not present

## 2018-08-12 DIAGNOSIS — Z0000 Encounter for general adult medical examination without abnormal findings: Secondary | ICD-10-CM

## 2018-08-12 DIAGNOSIS — F419 Anxiety disorder, unspecified: Secondary | ICD-10-CM | POA: Diagnosis not present

## 2018-08-12 NOTE — Progress Notes (Signed)
Subjective:    Patient ID: Dominic Barnes, male    DOB: [birth year], 27 y.o.   MRN: [id number]  Dominic Barnes is a 27 y.o. male presenting on 08/12/2018 for Annual Exam   HPI  Annual Physical Exam Patient has been feeling well, generally stable.  They have no acute concerns today, but notes he needs referral to [facility] in network as discussed at last visit. Sleeps about 8 hours per night uninterrupted.  Increased social anxiety - stays inside and sheltered, decreased libido.  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  Vegetables are limited.  Budget is limiting healthier choices. Seatbelt: always Sunscreen: not regularly unless summertime HIV: no new partners since last STD testing Optometry: no regular Dentistry: no regular in last 1 year  VACCINES: Tetanus: unknown - at least a "couple years" ago, but less than 10 years ago - Check [place] Influenza: due today Gardasil: defer today  Past Medical History:  Diagnosis Date  . Anxiety   . 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 Asthma    exercise induced. 1 episiode. Approx age 68.  [name] Headache    sinus  . Sinus trouble   . Testicle pain    Past Surgical History:  Procedure Laterality Date  . ELBOW SURGERY     right  . IMAGE GUIDED SINUS SURGERY N/A 08/30/2015   Procedure: IMAGE GUIDED SINUS SURGERY;  Surgeon: Bud Face, MD;  Location: [facility];  Service: ENT;  Laterality: N/A;  GAVE DISK TO [name] 11/3  . MAXILLARY ANTROSTOMY Bilateral 08/30/2015   Procedure: MAXILLARY ANTROSTOMY;  Surgeon: Bud Face, MD;  Location: [facility];  Service: ENT;  Laterality: Bilateral;  . NASAL SINUS SURGERY    . SEPTOPLASTY N/A 08/30/2015   Procedure: SEPTOPLASTY;  Surgeon: Bud Face, MD;  Location: [facility];  Service: ENT;  Laterality: N/A;  . TONSILLECTOMY    . TURBINATE REDUCTION Bilateral 08/30/2015   Procedure: INFERIOR TURBINATE REDUCTION ;  Surgeon: Bud Face, MD;  Location: [facility];  Service: ENT;  Laterality: Bilateral;  . WISDOM TOOTH EXTRACTION     Social History   Socioeconomic History  . Marital status: Single    Spouse name: Not on file  . Number of children: Not on file  . Years of education: Not on file  . Highest education level: Not on file  Occupational History  . Not on file  Social Needs  . Financial resource strain: Not on file  . Food insecurity:    Worry: Not on file    Inability: Not on file  . Transportation needs:    Medical: Not on file    Non-medical: Not on file  Tobacco Use  . Smoking status: Former Smoker    Last attempt to quit: 07/21/2012    Years since quitting: 6.0  . Smokeless tobacco: Former Neurosurgeon  . Tobacco comment: quit 5 years /occasional dip  Substance and Sexual Activity  . Alcohol use: Yes    Alcohol/week: 2.0 standard drinks    Types: 2 Cans of beer per week    Comment: socially  . Drug use: No  . Sexual activity: Not on file  Lifestyle  . Physical activity:    Days per week: Not on file    Minutes per session: Not on file  .  Stress: Not on file  Relationships  . Social connections:    Talks on phone: Not on file    Gets together: Not on file    Attends religious service: Not on file    Active member of club or organization: Not on file    Attends meetings of clubs or organizations: Not on file    Relationship status: Not on file  . Intimate partner violence:    Fear of current or ex partner: Not on file    Emotionally abused: Not on file    Physically abused: Not on file    Forced sexual activity: Not on file  Other Topics Concern  . Not on file  Social History Narrative  . Not on file   Family History  Problem Relation Age of Onset  . Cirrhosis Father   . Kidney disease Neg Hx   .  Prostate cancer Neg Hx    Current Outpatient Medications on File Prior to Visit  Medication Sig  . alfuzosin (UROXATRAL) 10 MG 24 hr tablet Take 10 mg by mouth.  . Multiple Vitamin (MULTIVITAMIN) tablet Take 1 tablet by mouth daily.  . risperiDONE (RISPERDAL) 0.5 MG tablet TAKE 1 TABLET BY MOUTH AT BEDTIME   No current facility-administered medications on file prior to visit.     Review of Systems Per HPI unless specifically indicated above     Objective:    BP 122/65   Pulse 79   Temp 98.6 F (37 C) (Oral)   Ht 6' (1.829 m)   Wt 160 lb 6.4 oz (72.8 kg)   BMI 21.75 kg/m   Wt Readings from Last 3 Encounters:  08/12/18 160 lb 6.4 oz (72.8 kg)  06/11/18 153 lb (69.4 kg)  02/12/18 161 lb 12.8 oz (73.4 kg)    Physical Exam  Constitutional: He is oriented to person, place, and time. He appears well-developed and well-nourished. No distress.  HENT:  Head: Normocephalic and atraumatic.  Right Ear: External ear normal.  Left Ear: External ear normal.  Nose: Nose normal.  Mouth/Throat: Oropharynx is clear and moist.  Eyes: Pupils are equal, round, and reactive to light. Conjunctivae are normal.  Neck: Normal range of motion. Neck supple. No JVD present. No tracheal deviation present. No thyromegaly present.  Cardiovascular: Normal rate, regular rhythm, normal heart sounds and intact distal pulses. Exam reveals no gallop and no friction rub.  No murmur heard. Pulmonary/Chest: Effort normal and breath sounds normal. No respiratory distress.  Abdominal: Soft. Bowel sounds are normal. He exhibits no distension. There is no hepatosplenomegaly. There is no tenderness.  Musculoskeletal: Normal range of motion.  Lymphadenopathy:    He has no cervical adenopathy.  Neurological: He is alert and oriented to person, place, and time. No cranial nerve deficit.  Skin: Skin is warm and dry. Capillary refill takes less than 2 seconds.  Psychiatric: His speech is normal and behavior is normal.  Judgment and thought content normal. His mood appears anxious.  Nursing note and vitals reviewed.   Results for orders placed or performed in visit on 06/11/18  POCT Urinalysis Dipstick  Result Value Ref Range   Color, UA yellow    Clarity, UA clear    Glucose, UA Negative Negative   Bilirubin, UA negative    Ketones, UA negative    Spec Grav, UA 1.010 1.010 - 1.025   Blood, UA negative    pH, UA 6.5 5.0 - 8.0   Protein, UA Negative Negative   Urobilinogen, UA  0.2 0.2 or 1.0 E.U./dL   Nitrite, UA negative    Leukocytes, UA Negative Negative   Appearance CLEAR    Odor        Assessment & Plan:   Problem List Items Addressed This Visit      Other   Anxiety Patient has requested new referral to psychiatry to resume care.  Referral placed for [facility]. He had previously been at a provider outside [place] and would like a location closer to home r/t transportation reliability.    Relevant Orders   Ambulatory referral to Psychiatry    Other Visit Diagnoses    Needs flu shot   Relevant Orders   Flu Vaccine QUAD 6+ mos PF IM (Fluarix Quad PF) (Completed)   Encounter for annual physical exam    - Primary Physical exam with no new findings.  Well adult with no acute concerns other than anxiety referral above.  Plan: 1. Obtain health maintenance screenings as above according to age. - Increase physical activity to 30 minutes most days of the week.  - Eat healthy diet high in vegetables and fruits; low in refined carbohydrates. - Labs as ordered - Flu vaccine today. 2. Return 1 year for annual physical.    Relevant Orders   CBC with Differential/Platelet   Lipid panel   TSH   COMPLETE METABOLIC PANEL WITH GFR        Follow up plan: Return in about 1 year (around 08/13/2019) for annual physical.  [name], DNP, AGPCNP-BC Adult Gerontology Primary Care Nurse Practitioner [facility] 08/12/2018, 9:17 AM

## 2018-08-12 NOTE — Patient Instructions (Addendum)
Dominic Barnes,   Thank you for coming in to clinic today.  1. www.DiningCalendar.de is a reliable source for vaccine information.  I am recommending Gardasil vaccine for HPV prevention.  Let us know anytime if you decide to get this vaccine.  This can be given at a [facility] vaccine visit.  2. Referral to [facility] has been placed.  It could take up to 6-8 weeks for an appointment.  - [phone]  They will call you to schedule this appointment.  3. Work on grounding/body scan activity as needed.  Please schedule a follow-up appointment with Dominic Barnes, AGNP. Return in about 1 year (around 08/13/2019) for annual physical.  If you have any other questions or concerns, please feel free to call the clinic or send a message through MyChart. You may also schedule an earlier appointment if necessary.  You will receive a survey after today's visit either digitally by e-mail or paper by [employer]. Your experiences and feedback matter to [place].  Please respond so we know how we are doing as we provide care for you.   Dominic Mcardle, DNP, AGNP-BC Adult Gerontology Nurse Practitioner [facility], [facility]

## 2018-08-17 ENCOUNTER — Other Ambulatory Visit: Payer: Self-pay

## 2018-08-19 ENCOUNTER — Encounter: Payer: Self-pay | Admitting: Nurse Practitioner

## 2018-09-14 ENCOUNTER — Telehealth: Payer: Self-pay | Admitting: Nurse Practitioner

## 2018-09-14 DIAGNOSIS — Z0000 Encounter for general adult medical examination without abnormal findings: Secondary | ICD-10-CM

## 2018-09-14 DIAGNOSIS — F419 Anxiety disorder, unspecified: Secondary | ICD-10-CM

## 2018-09-14 MED ORDER — RISPERIDONE 0.5 MG PO TABS: 0.5000 mg | ORAL_TABLET | Freq: Every day | ORAL | 0 refills | Status: DC

## 2018-09-14 NOTE — Telephone Encounter (Signed)
Incoming

## 2018-09-14 NOTE — Telephone Encounter (Signed)
Pt needs a refill on risperidone sent to [facility].  Pt hasn't heard from psychology referral.  [phone]

## 2018-09-14 NOTE — Telephone Encounter (Signed)
[facility]  Phone: [phone]  Can reopen referral.   According to prior referral, patient was called to schedule on 08/15/2018 and 09/02/2018 without answer and no voicemail set up.    I have made contact with patient today and given him [facility] phone number above.  While on phone, I reviewed their previous attempts to call, instructed him to call and attempt to schedule for himself soon.  New referral has been placed.

## 2018-10-29 ENCOUNTER — Ambulatory Visit: Payer: BLUE CROSS/BLUE SHIELD | Admitting: Psychiatry

## 2018-10-29 ENCOUNTER — Encounter: Payer: Self-pay | Admitting: Psychiatry

## 2018-10-29 ENCOUNTER — Other Ambulatory Visit: Payer: Self-pay

## 2018-10-29 VITALS — BP 146/88 | HR 101 | Temp 97.6°F | Wt 162.8 lb

## 2018-10-29 DIAGNOSIS — F410 Panic disorder [episodic paroxysmal anxiety] without agoraphobia: Secondary | ICD-10-CM

## 2018-10-29 DIAGNOSIS — Z9189 Other specified personal risk factors, not elsewhere classified: Secondary | ICD-10-CM

## 2018-10-29 DIAGNOSIS — F3181 Bipolar II disorder: Secondary | ICD-10-CM | POA: Diagnosis not present

## 2018-10-29 DIAGNOSIS — F411 Generalized anxiety disorder: Secondary | ICD-10-CM

## 2018-10-29 MED ORDER — LAMOTRIGINE 25 MG PO TABS: 25.0000 mg | ORAL_TABLET | Freq: Every day | ORAL | 0 refills | Status: DC

## 2018-10-29 MED ORDER — RISPERIDONE 1 MG PO TABS: 1.0000 mg | ORAL_TABLET | Freq: Every day | ORAL | 0 refills | Status: DC

## 2018-10-29 MED ORDER — CLONAZEPAM 0.5 MG PO TABS: 0.5000 mg | ORAL_TABLET | ORAL | 0 refills | Status: DC

## 2018-10-29 NOTE — Progress Notes (Signed)
Psychiatric Initial Adult Assessment   Patient Identification: Dominic Barnes MRN:  [id number] Date of Evaluation:  10/29/2018 Referral Source: Wilhelmina McardleLauren Kennedy NP Chief Complaint:  ' I am here to establish care.' Chief Complaint    Establish Care; Anxiety; Panic Attack; Stress     Visit Diagnosis:    ICD-10-CM   1. Bipolar 2 disorder, major depressive episode (HCC) F31.81 risperiDONE (RISPERDAL) 1 MG tablet    lamoTRIgine (LAMICTAL) 25 MG tablet  2. GAD (generalized anxiety disorder) F41.1   3. Panic attack F41.0 clonazePAM (KLONOPIN) 0.5 MG tablet  4. At risk for long QT syndrome Z91.89 EKG 12-Lead    History of Present Illness:  Dominic MaduroRobert is a 28 year old Caucasian male, employed, lives in [place], has a history of anxiety disorder, chronic prostatitis, IBS, presented to the clinic today to establish care.  Patient reports he has been struggling with mood lability since the past 2 years or more.  He describes this mood symptoms as having hypomanic episodes of elevated energy, hypersexuality, feeling happy and so on for 2 to 3 days at least twice a month.  Patient reports soon after that he would crash.  He reports he is currently going through a depressive phase.  He describes his depressive symptoms as sadness, lack of motivation, being withdrawn from friends, low energy and so on.  He also reports some passive suicidal thoughts on and off however reports he does not remember when was the last time he felt that way.  He currently denies suicidality.  He denies any active plan ever.  He denies any history of suicide attempts.  He reports he is currently on risperidone which was started by a psychiatrist previously, a year and a half ago.  He reports he does continue to take the medication.  Initially it was helpful but it does not help anymore.  Patient also reports anxiety symptoms.  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  He reports feeling nervous, anxious on  edge, being so restless that it is hard to sit still, become easily annoyed or irritable, feeling afraid something awful might happen and so on on a regular basis.  He reports he has been having some worsening anxiety symptoms since the past 1 month or so.  Patient also describes panic attacks.  He reports he has been struggling with panic attacks since the past several months.  He reports his panic attacks as racing heart rate, feeling hot or cold, shortness of breath which can come out of the blue and can last for an hour or so.  He reports he tries to distract himself which helps at times.  He used to be on Xanax along time ago which may have helped.  Patient denies any perceptual disturbances.  Patient denies abusing any drugs or alcohol.  Patient does report psychosocial stressors of his health problems.  He reports he has been struggling with chronic prostatitis which flares up once or twice every month.  He reports he struggles with spasms of his ureter and pain with ejaculation.  He reports this has been affecting his social life.  This also has been affecting his libido.  He reports he has not been able to get to the bottom of what is actually causing it.  He is on a medication called alfasozin, however he does not think it is helpful.  He has been working with his urologist.  Patient also describes IBS when he has severe spasms of his  stomach.  He reports there are times when his anxiety symptoms make these physical problems worse and there are times when his medical problems make him to be more anxious and go into a panic mode.  Patient reports good social support system from his brother with whom he lives.  Patient also works full-time and reports he loves his work.  Associated Signs/Symptoms: Depression Symptoms:  depressed mood, psychomotor retardation, fatigue, difficulty concentrating, hopelessness, recurrent thoughts of death, anxiety, (Hypo) Manic Symptoms:   Distractibility, Elevated Mood, Labiality of Mood, Anxiety Symptoms:  Excessive Worry, Panic Symptoms, Psychotic Symptoms:  denies PTSD Symptoms: Negative  Past Psychiatric History: Patient with history of anxiety disorder.  He does report previous treatment history by a psychiatrist in [place]-Dr. weed.  Patient denies inpatient mental health admissions.  Patient denies suicide attempts.  Previous Psychotropic Medications: Yes Trials of Effexor, Lexapro, Paxil, Prozac.  Patient also has tried Xanax for a brief period of time.  Patient reports he did not try any of these medications over a month and these medications were changed by his provider upon his request without giving it enough time. Substance Abuse History in the last 12 months:  No.  Consequences of Substance Abuse: Negative  Past Medical History:  Past Medical History:  Diagnosis Date  . Anxiety   . 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 Asthma    exercise induced. 1 episiode. Approx age 84.  [name] Headache    sinus  . Sinus trouble   . Testicle pain     Past Surgical History:  Procedure Laterality Date  . ELBOW SURGERY     right  . IMAGE GUIDED SINUS SURGERY N/A 08/30/2015   Procedure: IMAGE GUIDED SINUS SURGERY;  Surgeon: Bud Face, MD;  Location: [facility];  Service: ENT;  Laterality: N/A;  GAVE DISK TO [name] 11/3  . MAXILLARY ANTROSTOMY Bilateral 08/30/2015   Procedure: MAXILLARY ANTROSTOMY;  Surgeon: Bud Face, MD;  Location: [facility];  Service: ENT;  Laterality: Bilateral;  . NASAL SINUS SURGERY    . SEPTOPLASTY N/A 08/30/2015   Procedure: SEPTOPLASTY;  Surgeon: Bud Face, MD;  Location: [facility];  Service: ENT;  Laterality: N/A;  . TONSILLECTOMY    . TURBINATE REDUCTION Bilateral 08/30/2015   Procedure: INFERIOR TURBINATE REDUCTION ;  Surgeon: Bud Face, MD;  Location: [facility];  Service: ENT;  Laterality: Bilateral;  . WISDOM TOOTH  EXTRACTION      Family Psychiatric History: Father-depression and alcohol abuse.  Family History:  Family History  Problem Relation Age of Onset  . Cirrhosis Father   . Anxiety disorder Father   . Depression Father   . Alcohol abuse Father   . ADD / ADHD Brother   . Kidney disease Neg Hx   . Prostate cancer Neg Hx     Social History:   Social History   Socioeconomic History  . Marital status: Single    Spouse name: Not on file  . Number of children: 0  . Years of education: Not on file  . Highest education level: Some college, no degree  Occupational History  . Not on file  Social Needs  . Financial resource strain: Not very hard  . Food insecurity:    Worry: Never true    Inability: Never true  . Transportation needs:    Medical: No    Non-medical: No  Tobacco Use  . Smoking status: Former Smoker    Last attempt to quit:  07/21/2012    Years since quitting: 6.2  . Smokeless tobacco: Former NeurosurgeonUser  . Tobacco comment: quit 5 years /occasional dip  Substance and Sexual Activity  . Alcohol use: Yes    Alcohol/week: 12.0 standard drinks    Types: 12 Cans of beer per week    Comment: socially  . Drug use: Yes    Types: Marijuana    Comment: last used 2 weeks   . Sexual activity: Not Currently  Lifestyle  . Physical activity:    Days per week: 0 days    Minutes per session: 0 min  . Stress: Not on file  Relationships  . Social connections:    Talks on phone: Not on file    Gets together: Not on file    Attends religious service: Never    Active member of club or organization: No    Attends meetings of clubs or organizations: Never    Relationship status: Never married  Other Topics Concern  . Not on file  Social History Narrative  . Not on file    Additional Social History: Patient is single.  He lives in [place].  He and his brother live together in the same house and share the rent.  He reports his brother is a good social support system.  His father  passed away when he was 28 years old.  His mother raised him soon after that.  He does not have a great relationship with his mother anymore.  Patient currently works at a [employer] company.  He did some college however did not complete.  Allergies:  No Known Allergies  Metabolic Disorder Labs: No results found for: HGBA1C, MPG No results found for: PROLACTIN No results found for: CHOL, TRIG, HDL, CHOLHDL, VLDL, LDLCALC No results found for: TSH  Therapeutic Level Labs: No results found for: LITHIUM No results found for: CBMZ No results found for: VALPROATE  Current Medications: Current Outpatient Medications  Medication Sig Dispense Refill  . alfuzosin (UROXATRAL) 10 MG 24 hr tablet Take 10 mg by mouth.    . Multiple Vitamin (MULTIVITAMIN) tablet Take 1 tablet by mouth daily.    . clonazePAM (KLONOPIN) 0.5 MG tablet Take 1 tablet (0.5 mg total) by mouth as directed. ONLY FOR SEVERE PANIC ATTACKS 12 tablet 0  . lamoTRIgine (LAMICTAL) 25 MG tablet Take 1 tablet (25 mg total) by mouth daily. MOOD 30 tablet 0  . risperiDONE (RISPERDAL) 1 MG tablet Take 1 tablet (1 mg total) by mouth at bedtime. 30 tablet 0   No current facility-administered medications for this visit.     Musculoskeletal: Strength & Muscle Tone: within normal limits Gait & Station: normal Patient leans: N/A  Psychiatric Specialty Exam: Review of Systems  Psychiatric/Behavioral: Positive for depression. The patient is nervous/anxious.   All other systems reviewed and are negative.   Blood pressure (!) 146/88, pulse (!) 101, temperature 97.6 F (36.4 C), temperature source Oral, weight 162 lb 12.8 oz (73.8 kg).Body mass index is 22.08 kg/m.  General Appearance: Casual  Eye Contact:  Fair  Speech:  Clear and Coherent  Volume:  Normal  Mood:  Anxious and Dysphoric  Affect:  Appropriate  Thought Process:  Goal Directed and Descriptions of Associations: Intact  Orientation:  Full (Time, Place, and Person)   Thought Content:  Logical  Suicidal Thoughts:  No  Homicidal Thoughts:  No  Memory:  Immediate;   Fair Recent;   Fair Remote;   Fair  Judgement:  Fair  Insight:  Fair  Psychomotor Activity:  Normal  Concentration:  Concentration: Fair and Attention Span: Fair  Recall:  [employer] of Knowledge:Fair  Language: Fair  Akathisia:  No  Handed:  Right  AIMS (if indicated): 0  Assets:  Communication Skills Desire for Improvement Social Support  ADL's:  Intact  Cognition: WNL  Sleep:  Fair   Screenings: GAD-7     Office Visit from 10/28/2016 in [facility] Office Visit from 10/08/2016 in [facility] Office Visit from 08/05/2016 in [facility]  Total GAD-7 Score  20  19  19     PHQ2-9     Office Visit from 12/16/2016 in [facility] Office Visit from 11/14/2016 in [facility] Office Visit from 11/11/2016 in [facility] Office Visit from 10/28/2016 in [facility] Office Visit from 09/10/2016 in [place]  PHQ-2 Total Score  4  4  4  5  6   PHQ-9 Total Score  11  14  13  20  19       Assessment and Plan: Huber is a 28 year old Caucasian male, employed, single, lives in [place], has a history of anxiety disorder, panic attacks, chronic prostatitis, IBS, presented to the clinic today for a follow-up visit.  Patient is biologically predisposed given his family history as well as medical problems.  He also has psychosocial stressors of his recent health problems, relationship struggles and so on.  Patient does have a history of suicidal ideation however currently denies it.  He also denies any family history.  He denies suicide attempts.  He denies any active plans.  He is motivated to start treatment as well as pursue psychotherapy.  He has good social support from his brother.  Plan Bipolar 2 disorder Patient was able to complete a mood disorder questionnaire and scored  very high on the same. Increase risperidone to 1 mg p.o. nightly Add Lamictal 25 mg p.o. daily.  Discussed with patient the risk of side effects including Trudie Buckler syndrome.  For generalized anxiety disorder GAD 7 equals 19 Refer for CBT with therapist here in clinic. Lamictal as prescribed. Add Klonopin 0.5 mg as needed only for severe anxiety attacks.  For panic attacks Klonopin as prescribed.  Discussed with patient the risk of being on benzodiazepine therapy and advised to limit use.  Also discussed with him not to combine with alcohol.  At risk for QT Will order EKG- he is on risperidone.  We will order the following labs-TSH, lipid panel, hemoglobin A1c, prolactin, vitamin B12, CBC, CMP.  I have reviewed medical records in [facility]-dated 06/11/2018-progress note per Galen Manila, NP-patient with IBS, failed multiple SSRI /SNRIs in the past.  Patient hence was referred for psychiatric care.  Also reviewed notes per [facility] urology - Dr.Cope - 09/04/2017 - " patient with chronic prostatitis high pelvic floor muscle tone- was advised to follow pelvic muscle relaxation techniques for management.'  Follow-up in clinic in 2 weeks or sooner if needed.  I have spent atleast 60 minutes  face to face with patient today. More than 50 % of the time was spent for psychoeducation and supportive psychotherapy and care coordination.  This note was generated in part or whole with voice recognition software. Voice recognition is usually quite accurate but there are transcription errors that can and very often do occur. I apologize for any typographical errors that were not detected and corrected.  Jomarie Longs, MD 1/9/20202:22 PM

## 2018-10-29 NOTE — Patient Instructions (Signed)
Bipolar 2 Disorder Bipolar 2 disorder is a mental health disorder in which a person has episodes of emotional highs (mania) and lows (depression). Bipolar 2 is different from other bipolar disorders because the manic episodes are not as high and do not last as long. This is called hypomania. People with bipolar 2 disorder usually go back and forth between hypomanic and depressive episodes. What are the causes? The cause of this condition is not known. What increases the risk? The following factors may make you more likely to develop this condition:  Having a family member with the disorder.  An imbalance of certain chemicals in the brain (neurotransmitters).  Stress, such as a death, illness, or financial problems.  Certain conditions that affect the brain or spinal cord (neurologic conditions).  Brain injury (trauma).  Having another mental health disorder, such as: ? Obsessive compulsive disorder. ? Schizophrenia. What are the signs or symptoms? Symptoms of hypomania include:  Very high self-esteem or self-confidence.  Decreased need for sleep.  Unusual talkativeness or feeling a need to keep talking. Speech may be very fast. It may seem like you cannot stop talking.  Racing thoughts or constant talking, with quick shifts between topics that may or may not be related (flight of ideas).  Decreased ability to focus or concentrate.  Increased purposeful activity, such as work, studies, or social activity.  Increased nonproductive activity. This could be pacing, squirming and fidgeting, or finger and toe tapping.  Impulsive behavior and poor judgment. This may result in high-risk activities, such as having unprotected sex or spending a lot of money. Symptoms of depression include:  Feeling sad, hopeless, or helpless.  Frequent or uncontrollable crying.  Lack of feeling or caring about anything.  Sleeping too much.  Moving more slowly than usual.  Not being able to  enjoy things you used to enjoy.  Desire to be alone all the time.  Feeling guilty or worthless.  Lack of energy or motivation.  Trouble concentrating or remembering.  Trouble making decisions.  Increased appetite.  Thoughts of death or desire to harm yourself. How is this diagnosed? To diagnose bipolar 2 disorder, your health care provider may ask about your:  Emotional episodes.  Medical history.  Alcohol and drug use. This includes prescription medicines. Certain medical conditions and substances can cause symptoms that seem like bipolar disorder (secondary bipolar disorder). How is this treated? Bipolar 2 disorder is a long-term (chronic) illness. It is best controlled with ongoing (continuous) treatment rather than being treated only when symptoms occur. Treatment may include:  Psychotherapy. Some forms of talk therapy, such as cognitive-behavioral therapy (CBT), can provide support, education, and guidance.  Coping strategies, such as journaling or relaxation exercises. Relaxation exercises include: ? Yoga. ? Meditation. ? Deep breathing.  Lifestyle changes, such as: ? Limiting alcohol and drug use. ? Exercising regularly. ? Getting plenty of sleep. ? Making healthy eating choices.  Medicine. Medicine can be prescribed by a health care provider who specializes in treating mental disorders (psychiatrist). ? Medicines called mood stabilizers are usually prescribed. ? If symptoms occur even while taking a mood stabilizer, other medicines may be added. A combination of medicine, talk therapy, and coping methods is the best way to treat this condition. Follow these instructions at home: Activity  Return to your normal activities as told by your health care provider.  Find activities that you enjoy, and make time to do them.  Exercise regularly as told by your health care provider. Lifestyle  Limit  alcohol intake to no more than 1 drink a day for nonpregnant women  and 2 drinks a day for men. One drink equals 12 oz of beer, 5 oz of wine, or 1 oz of hard liquor.  Follow a set schedule for eating and sleeping.  Eat a balanced diet that includes fresh fruits and vegetables, whole grains, low-fat dairy, and lean meats.  Get at least 7-8 hours of sleep each night. General instructions  Take over-the-counter and prescription medicines only as told by your health care provider.  Think about joining a support group. Your health care provider may be able to recommend a support group.  Talk with your family and loved ones about your treatment goals and how they can help.  Keep all follow-up visits as told by your health care provider. This is important. Where to find more information For more information about bipolar 2 disorder, visit the following websites:  [employer] Mental Illness: www.nami.org  [employer] Mental [employer]: http://www.maynard.net/ Contact a health care provider if:  Your symptoms get worse.  You have side effects from your medicine, and they get worse.  You have trouble sleeping.  You have trouble doing daily activities.  You feel unsafe in your surroundings.  You are dealing with substance abuse. Get help right away if:  You have new symptoms.  You have thoughts about harming yourself or others.  You harm yourself. Summary  Bipolar 2 disorder is a mental health disorder in which a person has episodes of hypomania and depression.  Bipolar 2 is best treated through a combination of medicines, talk therapy, and coping strategies.  Talk with your family and loved ones about your treatment goals and how they can help. This information is not intended to replace advice given to you by your health care provider. Make sure you discuss any questions you have with your health care provider. Document Released: 11/12/2016 Document Revised: 11/12/2016 Document Reviewed: 11/12/2016 Elsevier Interactive Patient  Education  2019 [employer]. Lamotrigine tablets What is this medicine? LAMOTRIGINE (la MOE Patrecia Pace) is used to control seizures in adults and children with epilepsy and Lennox-Gastaut syndrome. It is also used in adults to treat bipolar disorder. This medicine may be used for other purposes; ask your health care provider or pharmacist if you have questions. COMMON BRAND NAME(S): Lamictal, Subvenite What should I tell my health care provider before I take this medicine? They need to know if you have any of these conditions: -aseptic meningitis during prior use of lamotrigine -depression -folate deficiency -kidney disease -liver disease -suicidal thoughts, plans, or attempt; a previous suicide attempt by you or a family member -an unusual or allergic reaction to lamotrigine or other seizure medications, other medicines, foods, dyes, or preservatives -pregnant or trying to get pregnant -breast-feeding How should I use this medicine? Take this medicine by mouth with a glass of water. Follow the directions on the prescription label. Do not chew these tablets. If this medicine upsets your stomach, take it with food or milk. Take your doses at regular intervals. Do not take your medicine more often than directed. A special MedGuide will be given to you by the pharmacist with each new prescription and refill. Be sure to read this information carefully each time. Talk to your pediatrician regarding the use of this medicine in children. While this drug may be prescribed for children as young as 2 years for selected conditions, precautions do apply. Overdosage: If you think you have taken  too much of this medicine contact a poison control center or emergency room at once. NOTE: This medicine is only for you. Do not share this medicine with others. What if I miss a dose? If you miss a dose, take it as soon as you can. If it is almost time for your next dose, take only that dose. Do not take double or  extra doses. What may interact with this medicine? -atazanavir -carbamazepine -male hormones, including contraceptive or birth control pills -lopinavir -methotrexate -phenobarbital -phenytoin -primidone -pyrimethamine -rifampin -ritonavir -trimethoprim -valproic acid This list may not describe all possible interactions. Give your health care provider a list of all the medicines, herbs, non-prescription drugs, or dietary supplements you use. Also tell them if you smoke, drink alcohol, or use illegal drugs. Some items may interact with your medicine. What should I watch for while using this medicine? Visit your doctor or health care professional for regular checks on your progress. If you take this medicine for seizures, wear a Medic Alert bracelet or necklace. Carry an identification card with information about your condition, medicines, and doctor or health care professional. It is important to take this medicine exactly as directed. When first starting treatment, your dose will need to be adjusted slowly. It may take weeks or months before your dose is stable. You should contact your doctor or health care professional if your seizures get worse or if you have any new types of seizures. Do not stop taking this medicine unless instructed by your doctor or health care professional. Stopping your medicine suddenly can increase your seizures or their severity. Contact your doctor or health care professional right away if you develop a rash while taking this medicine. Rashes may be very severe and sometimes require treatment in the hospital. Deaths from rashes have occurred. Serious rashes occur more often in children than adults taking this medicine. It is more common for these serious rashes to occur during the first 2 months of treatment, but a rash can occur at any time. You may get drowsy, dizzy, or have blurred vision. Do not drive, use machinery, or do anything that needs mental alertness  until you know how this medicine affects you. To reduce dizzy or fainting spells, do not sit or stand up quickly, especially if you are an older patient. Alcohol can increase drowsiness and dizziness. Avoid alcoholic drinks. If you are taking this medicine for bipolar disorder, it is important to report any changes in your mood to your doctor or health care professional. If your condition gets worse, you get mentally depressed, feel very hyperactive or manic, have difficulty sleeping, or have thoughts of hurting yourself or committing suicide, you need to get help from your health care professional right away. If you are a caregiver for someone taking this medicine for bipolar disorder, you should also report these behavioral changes right away. The use of this medicine may increase the chance of suicidal thoughts or actions. Pay special attention to how you are responding while on this medicine. Your mouth may get dry. Chewing sugarless gum or sucking hard candy, and drinking plenty of water may help. Contact your doctor if the problem does not go away or is severe. Women who become pregnant while using this medicine may enroll in the [place] [employer] Antiepileptic Drug Pregnancy Registry by calling [phone]. This registry collects information about the safety of antiepileptic drug use during pregnancy. This medicine may cause a decrease in folic acid. You should make sure that you get  enough folic acid while you are taking this medicine. Discuss the foods you eat and the vitamins you take with your health care professional. What side effects may I notice from receiving this medicine? Side effects that you should report to your doctor or health care professional as soon as possible: -allergic reactions like skin rash, itching or hives, swelling of the face, lips, or tongue -changes in vision -depressed mood -elevated mood, decreased need for sleep, racing thoughts, impulsive behavior -fever with  rash, swollen lymph nodes, or swelling of the face -loss of balance or coordination -mouth sores -redness, blistering, peeling or loosening of the skin, including inside the mouth -right upper belly pain -seizures -severe muscle pain -signs and symptoms of aseptic meningitis such as stiff neck and sensitivity to light, headache, drowsiness, fever, nausea, vomiting, rash -signs of infection - fever or chills, cough, sore throat, pain or difficulty passing urine -suicidal thoughts or other mood changes -swollen lymph nodes -trouble walking -unusual bruising or bleeding -unusually weak or tired -yellowing of the eyes or skin Side effects that usually do not require medical attention (report to your doctor or health care professional if they continue or are bothersome): -diarrhea -dizziness -dry mouth -stuffy nose -tiredness -tremors -trouble sleeping This list may not describe all possible side effects. Call your doctor for medical advice about side effects. You may report side effects to FDA at 1-800-FDA-1088. Where should I keep my medicine? Keep out of reach of children. Store at room temperature between 15 and 30 degrees C (59 and 86 degrees F). Throw away any unused medicine after the expiration date. NOTE: This sheet is a summary. It may not cover all possible information. If you have questions about this medicine, talk to your doctor, pharmacist, or health care provider.  2019 Elsevier/Gold Standard (2017-05-26 16:07:39) Clonazepam tablets What is this medicine? CLONAZEPAM (kloe NA ze pam) is a benzodiazepine. It is used to treat certain types of seizures. It is also used to treat panic disorder. This medicine may be used for other purposes; ask your health care provider or pharmacist if you have questions. COMMON BRAND NAME(S): Ceberclon, Klonopin What should I tell my health care provider before I take this medicine? They need to know if you have any of these  conditions: -an alcohol or drug abuse problem -bipolar disorder, depression, psychosis or other mental health condition -glaucoma -kidney or liver disease -lung or breathing disease -myasthenia gravis -Parkinson's disease -porphyria -seizures or a history of seizures -suicidal thoughts -an unusual or allergic reaction to clonazepam, other benzodiazepines, foods, dyes, or preservatives -pregnant or trying to get pregnant -breast-feeding How should I use this medicine? Take this medicine by mouth with a glass of water. Follow the directions on the prescription label. If it upsets your stomach, take it with food or milk. Take your medicine at regular intervals. Do not take it more often than directed. Do not stop taking or change the dose except on the advice of your doctor or health care professional. A special MedGuide will be given to you by the pharmacist with each prescription and refill. Be sure to read this information carefully each time. Talk to your pediatrician regarding the use of this medicine in children. Special care may be needed. Overdosage: If you think you have taken too much of this medicine contact a poison control center or emergency room at once. NOTE: This medicine is only for you. Do not share this medicine with others. What if I miss a dose? If  you miss a dose, take it as soon as you can. If it is almost time for your next dose, take only that dose. Do not take double or extra doses. What may interact with this medicine? Do not take this medication with any of the following medicines: -narcotic medicines for cough -sodium oxybate This medicine may also interact with the following medications: -alcohol -antihistamines for allergy, cough and cold -antiviral medicines for HIV or AIDS -certain medicines for anxiety or sleep -certain medicines for depression, like amitriptyline, fluoxetine, sertraline -certain medicines for fungal infections like ketoconazole and  itraconazole -certain medicines for seizures like carbamazepine, phenobarbital, phenytoin, primidone -general anesthetics like halothane, isoflurane, methoxyflurane, propofol -local anesthetics like lidocaine, pramoxine, tetracaine -medicines that relax muscles for surgery -narcotic medicines for pain -phenothiazines like chlorpromazine, mesoridazine, prochlorperazine, thioridazine This list may not describe all possible interactions. Give your health care provider a list of all the medicines, herbs, non-prescription drugs, or dietary supplements you use. Also tell them if you smoke, drink alcohol, or use illegal drugs. Some items may interact with your medicine. What should I watch for while using this medicine? Tell your doctor or health care professional if your symptoms do not start to get better or if they get worse. Do not stop taking except on your doctor's advice. You may develop a severe reaction. Your doctor will tell you how much medicine to take. You may get drowsy or dizzy. Do not drive, use machinery, or do anything that needs mental alertness until you know how this medicine affects you. To reduce the risk of dizzy and fainting spells, do not stand or sit up quickly, especially if you are an older patient. Alcohol may increase dizziness and drowsiness. Avoid alcoholic drinks. If you are taking another medicine that also causes drowsiness, you may have more side effects. Give your health care provider a list of all medicines you use. Your doctor will tell you how much medicine to take. Do not take more medicine than directed. Call emergency for help if you have problems breathing or unusual sleepiness. The use of this medicine may increase the chance of suicidal thoughts or actions. Pay special attention to how you are responding while on this medicine. Any worsening of mood, or thoughts of suicide or dying should be reported to your health care professional right away. What side effects  may I notice from receiving this medicine? Side effects that you should report to your doctor or health care professional as soon as possible: -allergic reactions like skin rash, itching or hives, swelling of the face, lips, or tongue -breathing problems -confusion -loss of balance or coordination -signs and symptoms of low blood pressure like dizziness; feeling faint or lightheaded, falls; unusually weak or tired -suicidal thoughts or mood changes Side effects that usually do not require medical attention (report to your doctor or health care professional if they continue or are bothersome): -dizziness -headache -tiredness -upset stomach This list may not describe all possible side effects. Call your doctor for medical advice about side effects. You may report side effects to FDA at 1-800-FDA-1088. Where should I keep my medicine? Keep out of the reach of children. This medicine can be abused. Keep your medicine in a safe place to protect it from theft. Do not share this medicine with anyone. Selling or giving away this medicine is dangerous and against the law. This medicine may cause accidental overdose and death if taken by other adults, children, or pets. Mix any unused medicine with a  substance like cat litter or coffee grounds. Then throw the medicine away in a sealed container like a sealed bag or a coffee can with a lid. Do not use the medicine after the expiration date. Store at room temperature between 15 and 30 degrees C (59 and 86 degrees F). Protect from light. Keep container tightly closed. NOTE: This sheet is a summary. It may not cover all possible information. If you have questions about this medicine, talk to your doctor, pharmacist, or health care provider.  2019 Elsevier/Gold Standard (2016-03-15 18:46:32)

## 2018-11-05 ENCOUNTER — Ambulatory Visit (INDEPENDENT_AMBULATORY_CARE_PROVIDER_SITE_OTHER): Payer: BLUE CROSS/BLUE SHIELD | Admitting: Licensed Clinical Social Worker

## 2018-11-05 DIAGNOSIS — F411 Generalized anxiety disorder: Secondary | ICD-10-CM | POA: Diagnosis not present

## 2018-11-05 DIAGNOSIS — F3181 Bipolar II disorder: Secondary | ICD-10-CM

## 2018-11-05 NOTE — Progress Notes (Signed)
Comprehensive Clinical Assessment (CCA) Note  11/05/2018 Knute Neu [facility] [id number]  Visit Diagnosis:      ICD-10-CM   1. Bipolar 2 disorder, major depressive episode (HCC) F31.81   2. GAD (generalized anxiety disorder) F41.1       CCA Part One  Part One has been completed on paper by the patient.  (See scanned document in Chart Review)  CCA Part Two A  Intake/Chief Complaint:  CCA Intake With Chief Complaint CCA Part Two Date: 11/05/18 CCA Part Two Time: 0802 Chief Complaint/Presenting Problem: Stress and anxiety.  WOrk is a Development worker, community as well as money and health concerns (prostate and IBS).  Reports panic and anxiety began about 2 years ago.  Reports that he lives with his half brother.  Reports no fun or activity.  Reports that he has pushed all friends away. Patients Currently Reported Symptoms/Problems: Reports no concerns for sleep and a normal appetite. increase fatigue, reports feeling nervous  Mental Health Symptoms Depression:  Depression: Change in energy/activity, Fatigue, Hopelessness, Worthlessness, Irritability  Mania:  Mania: N/A  Anxiety:   Anxiety: Worrying, Tension, Difficulty concentrating, Irritability, Fatigue  Psychosis:  Psychosis: N/A  Trauma:  Trauma: (parent died Patient was 11.)  Obsessions:  Obsessions: N/A  Compulsions:  Compulsions: N/A  Inattention:  Inattention: N/A  Hyperactivity/Impulsivity:  Hyperactivity/Impulsivity: N/A  Oppositional/Defiant Behaviors:  Oppositional/Defiant Behaviors: N/A  Borderline Personality:  Emotional Irregularity: N/A  Other Mood/Personality Symptoms:      Mental Status Exam Appearance and self-care  Stature:  Stature: Average  Weight:  Weight: Average weight  Clothing:  Clothing: Neat/clean  Grooming:  Grooming: Normal  Cosmetic use:  Cosmetic Use: Age appropriate  Posture/gait:  Posture/Gait: Normal  Motor activity:  Motor Activity: Not Remarkable  Sensorium  Attention:  Attention: Normal  Concentration:   Concentration: Normal  Orientation:  Orientation: X5  Recall/memory:  Recall/Memory: Normal  Affect and Mood  Affect:  Affect: Appropriate  Mood:  Mood: Anxious  Relating  Eye contact:  Eye Contact: Normal  Facial expression:  Facial Expression: Responsive  Attitude toward examiner:  Attitude Toward Examiner: Cooperative  Thought and Language  Speech flow: Speech Flow: Normal  Thought content:  Thought Content: Appropriate to mood and circumstances  Preoccupation:     Hallucinations:     Organization:     Company secretary of Knowledge:  Fund of Knowledge: Average  Intelligence:  Intelligence: Average  Abstraction:  Abstraction: Normal  Judgement:  Judgement: Fair  Dance movement psychotherapist:  Reality Testing: Adequate  Insight:  Insight: Fair  Decision Making:  Decision Making: Normal  Social Functioning  Social Maturity:     Social Judgement:     Stress  Stressors:  Stressors: Family conflict, Illness  Coping Ability:  Coping Ability: Building surveyor Deficits:     Supports:      Family and Psychosocial History: Family history Marital status: Single Are you sexually active?: No Does patient have children?: No  Childhood History:  Childhood History By whom was/is the patient raised?: Mother Additional childhood history information: Born in [place].  Describes childhood as: my brother and mom.  SHe remarried after my dad died.  SHe would get a boyfriend and we would move in with him. Played sports (baseball, basketball, football, tennis).  We moved into my Grandfather's home and then I started hunting. Description of patient's relationship with caregiver when they were a child: Mother: we were good.  I would talk to her.  loving relationship.  Father: only a  few memories.  he had alcoholism and died when I was 11. Patient's description of current relationship with people who raised him/her: Mother: we rarely talk.  we but heads.  haven't spoken in 1.76yrs. Father:  deceased How were you disciplined when you got in trouble as a child/adolescent?: spankings, discussions Does patient have siblings?: Yes Number of Siblings: 1 Description of patient's current relationship with siblings: he has a different father.  we live together. we get along well. Did patient suffer any verbal/emotional/physical/sexual abuse as a child?: No Did patient suffer from severe childhood neglect?: No Has patient ever been sexually abused/assaulted/raped as an adolescent or adult?: No Was the patient ever a victim of a crime or a disaster?: No Witnessed domestic violence?: No Has patient been effected by domestic violence as an adult?: No  CCA Part Two B  Employment/Work Situation: Employment / Work Psychologist, occupational Employment situation: Employed Where is patient currently employed?: [employer] long has patient been employed?: 2yrs What is the longest time patient has a held a job?: 6 yrs 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  Education: Education Name of [employer] School: [employer]ator, museum/gallery [employer] Did Garment/textile technologist From [employer]?: Yes Did Theme park manager?: Yes What Type of College Degree Do you Have?: [employer](did not graduate) Did Secretary/administrator School?: No Did You Have An Individualized Education Program (IIEP): No Did You Have Any Difficulty At School?: No  Religion: Religion/Spirituality Are You A Religious Person?: No(baptised in 2012)  Leisure/Recreation: Leisure / Recreation Leisure and Hobbies: sporting events, movies, tennis  Exercise/Diet: Exercise/Diet Do You Exercise?: Yes What Type of Exercise Do You Do?: Run/Walk How Many Times a Week Do You Exercise?: 1-3 times a week Have You Gained or Lost A Significant Amount of Weight in the Past Six Months?: No Do You Follow a Special Diet?: No Do You Have Any Trouble Sleeping?: No  CCA Part Two C  Alcohol/Drug Use: Alcohol / Drug Use Pain Medications: denies Prescriptions:  risperidone, lamictal, klonopin Over the Counter: goody powder (as needed) History of alcohol / drug use?: No history of alcohol / drug abuse                      CCA Part Three  ASAM's:  Six Dimensions of Multidimensional Assessment  Dimension 1:  Acute Intoxication and/or Withdrawal Potential:     Dimension 2:  Biomedical Conditions and Complications:     Dimension 3:  Emotional, Behavioral, or Cognitive Conditions and Complications:     Dimension 4:  Readiness to Change:     Dimension 5:  Relapse, Continued use, or Continued Problem Potential:     Dimension 6:  Recovery/Living Environment:      Substance use Disorder (SUD)    Social Function:     Stress:  Stress Stressors: Family conflict, Illness Coping Ability: Overwhelmed Patient Takes Medications The Way The Doctor Instructed?: Yes Priority Risk: Low Acuity  Risk Assessment- Self-Harm Potential: Risk Assessment For Self-Harm Potential Thoughts of Self-Harm: No current thoughts Method: No plan Availability of Means: No access/NA  Risk Assessment -Dangerous to Others Potential: Risk Assessment For Dangerous to Others Potential Method: No Plan Availability of Means: No access or NA Intent: Vague intent or NA Notification Required: No need or identified person  DSM5 Diagnoses: Patient Active Problem List   Diagnosis Date Noted  . IBS (irritable bowel syndrome) 06/12/2018  . Back pain 06/12/2018  . Grade III hemorrhoids 02/12/2018  . High-tone  pelvic floor dysfunction 04/10/2017  . Pelvic pain in male 03/12/2017  . Chronic prostatitis 03/09/2017  . Depression with anxiety 08/05/2016  . Pain with ejaculation 11/28/2015  . Anxiety 11/28/2015  . Sinus disorder 11/08/2015  . Dysesthesia 11/08/2015    Patient Centered Plan: Patient is on the following Treatment Plan(s):  Anxiety  Recommendations for Services/Supports/Treatments: Recommendations for Services/Supports/Treatments Recommendations For  Services/Supports/Treatments: Individual Therapy, Medication Management  Treatment Plan Summary:    Referrals to Alternative Service(s): Referred to Alternative Service(s):   Place:   Date:   Time:    Referred to Alternative Service(s):   Place:   Date:   Time:    Referred to Alternative Service(s):   Place:   Date:   Time:    Referred to Alternative Service(s):   Place:   Date:   Time:     Marinda Elkicole M Peacock

## 2018-11-12 ENCOUNTER — Ambulatory Visit: Payer: BLUE CROSS/BLUE SHIELD | Admitting: Psychiatry

## 2018-11-12 ENCOUNTER — Encounter: Payer: Self-pay | Admitting: Psychiatry

## 2018-11-12 ENCOUNTER — Other Ambulatory Visit: Payer: Self-pay

## 2018-11-12 VITALS — BP 128/80 | HR 60 | Temp 97.8°F | Wt 163.8 lb

## 2018-11-12 DIAGNOSIS — F3181 Bipolar II disorder: Secondary | ICD-10-CM | POA: Diagnosis not present

## 2018-11-12 DIAGNOSIS — F411 Generalized anxiety disorder: Secondary | ICD-10-CM

## 2018-11-12 DIAGNOSIS — F410 Panic disorder [episodic paroxysmal anxiety] without agoraphobia: Secondary | ICD-10-CM | POA: Diagnosis not present

## 2018-11-12 MED ORDER — CLONAZEPAM 0.5 MG PO TABS: 0.5000 mg | ORAL_TABLET | ORAL | 0 refills | Status: DC

## 2018-11-12 MED ORDER — RISPERIDONE 1 MG PO TABS: 1.0000 mg | ORAL_TABLET | Freq: Every day | ORAL | 0 refills | Status: DC

## 2018-11-12 MED ORDER — LAMOTRIGINE 25 MG PO TABS: 25.0000 mg | ORAL_TABLET | Freq: Two times a day (BID) | ORAL | 1 refills | Status: DC

## 2018-11-12 NOTE — Progress Notes (Signed)
BH MD OP Progress Note  11/12/2018 5:42 PM Dominic HornRobert G Barnes  MRN:  [id number]  Chief Complaint: ' I am here for follow up.' Chief Complaint    Follow-up; Medication Refill     HPI: Dominic MaduroRobert is a 28 year old Caucasian male, employed, lives in [place], has a history of anxiety disorder, chronic prostatitis, IBS, presented to clinic today for a follow-up visit.  Patient reports he is compliant on his Lamictal and risperidone.  He reports he has noticed some improvement in his energy level, his fatigue and his concentration.  He reports he continues to worry on a regular basis.  He reports he worries about several things on a daily basis.  He reports he also has had some anxiety attacks and has taken Klonopin for the same.  He reports he has been trying to limit the use of Klonopin as much as he can.  Patient reports sleep is improved.  Patient reports he has started psychotherapy sessions with Ms. Peacock.  He is motivated to stay in therapy.  Patient does report financial stressors and hence reports he has not been able to get his EKG had labs as ordered last visit.  He reports he has to get established with a new primary medical doctor and he has to wait until then.   Visit Diagnosis:    ICD-10-CM   1. Bipolar 2 disorder, major depressive episode (HCC) F31.81 lamoTRIgine (LAMICTAL) 25 MG tablet    risperiDONE (RISPERDAL) 1 MG tablet  2. GAD (generalized anxiety disorder) F41.1   3. Panic attack F41.0 clonazePAM (KLONOPIN) 0.5 MG tablet    Past Psychiatric History: Reviewed past psychiatric history from my progress note on 10/29/2018.  Past trials of Effexor, Lexapro, Paxil, Prozac.  Past Medical History:  Past Medical History:  Diagnosis Date  . Anxiety   . 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 Asthma    exercise induced. 1 episiode. Approx age 28.  Marland Barnes. Headache    sinus  . Sinus trouble   . Testicle pain     Past Surgical History:  Procedure Laterality Date  . ELBOW SURGERY      right  . IMAGE GUIDED SINUS SURGERY N/A 08/30/2015   Procedure: IMAGE GUIDED SINUS SURGERY;  Surgeon: Bud Facereighton Vaught, MD;  [facility];  Service: ENT;  Laterality: N/A;  GAVE DISK TO [name] 11/3  . MAXILLARY ANTROSTOMY Bilateral 08/30/2015   Procedure: MAXILLARY ANTROSTOMY;  Surgeon: Bud Facereighton Vaught, MD;  [facility];  Service: ENT;  Laterality: Bilateral;  . NASAL SINUS SURGERY    . SEPTOPLASTY N/A 08/30/2015   Procedure: SEPTOPLASTY;  Surgeon: Bud Facereighton Vaught, MD;  [facility];  Service: ENT;  Laterality: N/A;  . TONSILLECTOMY    . TURBINATE REDUCTION Bilateral 08/30/2015   Procedure: INFERIOR TURBINATE REDUCTION ;  Surgeon: Bud Facereighton Vaught, MD;  [facility];  Service: ENT;  Laterality: Bilateral;  . WISDOM TOOTH EXTRACTION      Family Psychiatric History: I have reviewed family psychiatric history from my progress note on 10/29/2018  Family History:  Family History  Problem Relation Age of Onset  . Cirrhosis Father   . Anxiety disorder Father   . Depression Father   . Alcohol abuse Father   . ADD / ADHD Brother   . Kidney disease Neg Hx   . Prostate cancer Neg Hx     Social History: I have reviewed social history from my progress note on 10/29/2018 Social History  Socioeconomic History  . Marital status: Single    Spouse name: Not on file  . Number of children: 0  . Years of education: Not on file  . Highest education level: Some college, no degree  Occupational History  . Not on file  Social Needs  . Financial resource strain: Not very hard  . Food insecurity:    Worry: Never true    Inability: Never true  . Transportation needs:    Medical: No    Non-medical: No  Tobacco Use  . Smoking status: Former Smoker    Last attempt to quit: 07/21/2012    Years since quitting: 6.3  . Smokeless tobacco: Former NeurosurgeonUser  . Tobacco comment: quit 5 years /occasional dip  Substance and Sexual Activity  .  Alcohol use: Yes    Alcohol/week: 12.0 standard drinks    Types: 12 Cans of beer per week    Comment: socially  . Drug use: Yes    Types: Marijuana    Comment: last used 2 weeks   . Sexual activity: Not Currently  Lifestyle  . Physical activity:    Days per week: 0 days    Minutes per session: 0 min  . Stress: Not on file  Relationships  . Social connections:    Talks on phone: Not on file    Gets together: Not on file    Attends religious service: Never    Active member of club or organization: No    Attends meetings of clubs or organizations: Never    Relationship status: Never married  Other Topics Concern  . Not on file  Social History Narrative  . Not on file    Allergies: No Known Allergies  Metabolic Disorder Labs: No results found for: HGBA1C, MPG No results found for: PROLACTIN No results found for: CHOL, TRIG, HDL, CHOLHDL, VLDL, LDLCALC No results found for: TSH  Therapeutic Level Labs: No results found for: LITHIUM No results found for: VALPROATE No components found for:  CBMZ  Current Medications: Current Outpatient Medications  Medication Sig Dispense Refill  . alfuzosin (UROXATRAL) 10 MG 24 hr tablet Take 10 mg by mouth.    Dominic Barnes. [START ON 11/23/2018] clonazePAM (KLONOPIN) 0.5 MG tablet Take 1 tablet (0.5 mg total) by mouth as directed. ONLY FOR SEVERE PANIC ATTACKS 12 tablet 0  . lamoTRIgine (LAMICTAL) 25 MG tablet Take 1 tablet (25 mg total) by mouth 2 (two) times daily. MOOD 60 tablet 1  . Multiple Vitamin (MULTIVITAMIN) tablet Take 1 tablet by mouth daily.    . risperiDONE (RISPERDAL) 1 MG tablet Take 1 tablet (1 mg total) by mouth at bedtime. 30 tablet 0   No current facility-administered medications for this visit.      Musculoskeletal: Strength & Muscle Tone: within normal limits Gait & Station: normal Patient leans: N/A  Psychiatric Specialty Exam: Review of Systems  Psychiatric/Behavioral: The patient is nervous/anxious.   All other  systems reviewed and are negative.   Blood pressure 128/80, pulse 60, temperature 97.8 F (36.6 C), temperature source Oral, weight 163 lb 12.8 oz (74.3 kg).Body mass index is 22.22 kg/m.  General Appearance: Casual  Eye Contact:  Fair  Speech:  Clear and Coherent  Volume:  Normal  Mood:  Anxious  Affect:  Congruent  Thought Process:  Goal Directed and Descriptions of Associations: Intact  Orientation:  Full (Time, Place, and Person)  Thought Content: Logical   Suicidal Thoughts:  No  Homicidal Thoughts:  No  Memory:  Immediate;  Fair Recent;   Fair Remote;   Fair  Judgement:  Fair  Insight:  Fair  Psychomotor Activity:  Normal  Concentration:  Concentration: Fair and Attention Span: Fair  Recall:  [employer] of Knowledge: Fair  Language: Fair  Akathisia:  No  Handed:  Right  AIMS (if indicated): denies tremors, rigidity,stiffness  Assets:  Communication Skills Desire for Improvement Social Support  ADL's:  Intact  Cognition: WNL  Sleep:  Fair   Screenings: GAD-7     [facility] Visit from 10/28/2016 in [facility] Office Visit from 10/08/2016 in [facility] Office Visit from 08/05/2016 in [facility]  Total GAD-7 Score  20  19  19     PHQ2-9     Office Visit from 12/16/2016 in [facility] Office Visit from 11/14/2016 in [facility] Office Visit from 11/11/2016 in [facility] Office Visit from 10/28/2016 in [facility] Office Visit from 09/10/2016 in [facility]  PHQ-2 Total Score  4  4  4  5  6   PHQ-9 Total Score  11  14  13  20  19        Assessment and Plan: Levere is a 28 year old Caucasian male, employed, single, lives in [place], has a history of anxiety disorder, panic attacks, chronic prostatitis, IBS, presented to clinic today for a follow-up visit.  Patient is biologically predisposed given his family history as well as medical problems.  He also  has psychosocial stressors of recent health problems, relationship struggles.  Patient is currently making some progress on the current medication regimen.  He however will benefit from medication readjustment since he continues to struggle with anxiety symptoms.  Plan as noted below.  Plan Bipolar 2 disorder-unstable Continue risperidone 1 mg p.o. nightly Increase Lamictal to 25 mg p.o. twice daily.  For generalized anxiety disorder-unstable Continue CBT. Increase Lamictal as discussed above. Continue Klonopin 0.5 mg as needed for severe anxiety attacks.  Patient advised to limit use.  For panic attacks- improving Klonopin as prescribed.  Pending EKG, labs TSH, lipid panel, hemoglobin A1c, prolactin, vitamin B12, CBC and CMP.  Follow-up in clinic in 4 weeks or sooner if needed.  I have spent atleast 15 minutes face to face with patient today. More than 50 % of the time was spent for psychoeducation and supportive psychotherapy and care coordination.  This note was generated in part or whole with voice recognition software. Voice recognition is usually quite accurate but there are transcription errors that can and very often do occur. I apologize for any typographical errors that were not detected and corrected.       Jomarie Longs, MD 11/12/2018, 5:42 PM

## 2018-12-14 ENCOUNTER — Ambulatory Visit (INDEPENDENT_AMBULATORY_CARE_PROVIDER_SITE_OTHER): Payer: BLUE CROSS/BLUE SHIELD | Admitting: Licensed Clinical Social Worker

## 2018-12-14 ENCOUNTER — Ambulatory Visit: Payer: BLUE CROSS/BLUE SHIELD | Admitting: Psychiatry

## 2018-12-14 ENCOUNTER — Encounter: Payer: Self-pay | Admitting: Psychiatry

## 2018-12-14 ENCOUNTER — Other Ambulatory Visit: Payer: Self-pay

## 2018-12-14 VITALS — BP 145/85 | HR 56 | Temp 97.7°F | Wt 164.4 lb

## 2018-12-14 DIAGNOSIS — F410 Panic disorder [episodic paroxysmal anxiety] without agoraphobia: Secondary | ICD-10-CM

## 2018-12-14 DIAGNOSIS — F3181 Bipolar II disorder: Secondary | ICD-10-CM | POA: Diagnosis not present

## 2018-12-14 DIAGNOSIS — F411 Generalized anxiety disorder: Secondary | ICD-10-CM

## 2018-12-14 MED ORDER — RISPERIDONE 2 MG PO TABS: 2.0000 mg | ORAL_TABLET | Freq: Every day | ORAL | 1 refills | Status: DC

## 2018-12-14 MED ORDER — LAMOTRIGINE 25 MG PO TABS: 75.0000 mg | ORAL_TABLET | ORAL | 1 refills | Status: DC

## 2018-12-14 NOTE — Progress Notes (Signed)
BH MD OP Progress Note  12/14/2018 12:22 PM Dominic Barnes  MRN:  [id number]  Chief Complaint: ' I am here for follow up." Chief Complaint    Follow-up; Medication Refill     HPI: Dominic Barnes is a 28 yr old Caucasian male, employed, lives in [place], has a history of anxiety disorder, bipolar disorder, chronic prostatitis, IBS, presented to clinic today for a follow-up visit.   Patient today reports that his GI symptoms have been going through a flareup the last few days.  He reports he has to use the bathroom several times during the day and hence this is making him more depressed and anxious.  He reports the last few days it has been difficult for him to do anything.  He has been sleeping more.  Patient reports he would like to reach out to his GI provider again for recommendations.  He is working on that.  Patient reports he likes the effect of his psychotropic medications like risperidone and Lamictal.  He denies any side effects.  He reports he is not irritable or angry like he used to be before.  He reports the medication may also be helping him to sleep better.  Discussed readjusting his dosage.  Also discussed reaching out to his therapist and also working on coping with his GI symptoms with his therapist.  He agrees with plan.  Patient reports he did have some death wish in the last month however he denies any active suicidal thoughts at this time.  Patient seems to be future oriented and reports he enjoys his work.  He is actually getting a promotion and he looks forward to that.  Visit Diagnosis:    ICD-10-CM   1. Bipolar 2 disorder, major depressive episode (HCC) F31.81 risperiDONE (RISPERDAL) 2 MG tablet    lamoTRIgine (LAMICTAL) 25 MG tablet  2. GAD (generalized anxiety disorder) F41.1   3. Panic attack F41.0     Past Psychiatric History: Reviewed past psychiatric history from my progress note on 10/29/2018.  Past trials of Effexor, Lexapro, Paxil, Prozac  Past Medical History:   Past Medical History:  Diagnosis Date  . Anxiety   . 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 Asthma    exercise induced. 1 episiode. Approx age 61.  [name] Headache    sinus  . Sinus trouble   . Testicle pain     Past Surgical History:  Procedure Laterality Date  . ELBOW SURGERY     right  . IMAGE GUIDED SINUS SURGERY N/A 08/30/2015   Procedure: IMAGE GUIDED SINUS SURGERY;  Surgeon: Bud Face, MD;  Location: [facility];  Service: ENT;  Laterality: N/A;  GAVE DISK TO [name] 11/3  . MAXILLARY ANTROSTOMY Bilateral 08/30/2015   Procedure: MAXILLARY ANTROSTOMY;  Surgeon: Bud Face, MD;  Location: [facility];  Service: ENT;  Laterality: Bilateral;  . NASAL SINUS SURGERY    . SEPTOPLASTY N/A 08/30/2015   Procedure: SEPTOPLASTY;  Surgeon: Bud Face, MD;  Location: [facility];  Service: ENT;  Laterality: N/A;  . TONSILLECTOMY    . TURBINATE REDUCTION Bilateral 08/30/2015   Procedure: INFERIOR TURBINATE REDUCTION ;  Surgeon: Bud Face, MD;  Location: [facility];  Service: ENT;  Laterality: Bilateral;  . WISDOM TOOTH EXTRACTION      Family Psychiatric History: Reviewed family psychiatric history from my progress note on 10/29/2018  Family History:  Family History  Problem Relation Age of Onset  . Cirrhosis Father   .  Anxiety disorder Father   . Depression Father   . Alcohol abuse Father   . ADD / ADHD Brother   . Kidney disease Neg Hx   . Prostate cancer Neg Hx     Social History: I have reviewed social history from my progress note on 10/29/2018. Social History   Socioeconomic History  . Marital status: Single    Spouse name: Not on file  . Number of children: 0  . Years of education: Not on file  . Highest education level: Some college, no degree  Occupational History  . Not on file  Social Needs  . Financial resource strain: Not very hard  . Food insecurity:    Worry: Never true    Inability: Never true  .  Transportation needs:    Medical: No    Non-medical: No  Tobacco Use  . Smoking status: Former Smoker    Last attempt to quit: 07/21/2012    Years since quitting: 6.4  . Smokeless tobacco: Former Neurosurgeon  . Tobacco comment: quit 5 years /occasional dip  Substance and Sexual Activity  . Alcohol use: Yes    Alcohol/week: 12.0 standard drinks    Types: 12 Cans of beer per week    Comment: socially  . Drug use: Yes    Types: Marijuana    Comment: last used 2 weeks   . Sexual activity: Not Currently  Lifestyle  . Physical activity:    Days per week: 0 days    Minutes per session: 0 min  . Stress: Not on file  Relationships  . Social connections:    Talks on phone: Not on file    Gets together: Not on file    Attends religious service: Never    Active member of club or organization: No    Attends meetings of clubs or organizations: Never    Relationship status: Never married  Other Topics Concern  . Not on file  Social History Narrative  . Not on file    Allergies: No Known Allergies  Metabolic Disorder Labs: No results found for: HGBA1C, MPG No results found for: PROLACTIN No results found for: CHOL, TRIG, HDL, CHOLHDL, VLDL, LDLCALC No results found for: TSH  Therapeutic Level Labs: No results found for: LITHIUM No results found for: VALPROATE No components found for:  CBMZ  Current Medications: Current Outpatient Medications  Medication Sig Dispense Refill  . alfuzosin (UROXATRAL) 10 MG 24 hr tablet Take 10 mg by mouth.    . clonazePAM (KLONOPIN) 0.5 MG tablet Take 1 tablet (0.5 mg total) by mouth as directed. ONLY FOR SEVERE PANIC ATTACKS 12 tablet 0  . lamoTRIgine (LAMICTAL) 25 MG tablet Take 3 tablets (75 mg total) by mouth as directed. Take 1 tablet in the AM and 2 tablets PM 90 tablet 1  . Multiple Vitamin (MULTIVITAMIN) tablet Take 1 tablet by mouth daily.    . risperiDONE (RISPERDAL) 2 MG tablet Take 1 tablet (2 mg total) by mouth at bedtime. 30 tablet 1    No current facility-administered medications for this visit.      Musculoskeletal: Strength & Muscle Tone: within normal limits Gait & Station: normal Patient leans: N/A  Psychiatric Specialty Exam: Review of Systems  Psychiatric/Behavioral: Positive for depression. The patient is nervous/anxious.   All other systems reviewed and are negative.   Blood pressure (!) 145/85, pulse (!) 56, temperature 97.7 F (36.5 C), temperature source Oral, weight 164 lb 6.4 oz (74.6 kg).Body mass index is 22.3 kg/m.  General Appearance: Casual  Eye Contact:  Fair  Speech:  Clear and Coherent  Volume:  Normal  Mood:  Anxious and Dysphoric  Affect:  Congruent  Thought Process:  Goal Directed and Descriptions of Associations: Intact  Orientation:  Full (Time, Place, and Person)  Thought Content: Logical   Suicidal Thoughts:  No  Homicidal Thoughts:  No  Memory:  Immediate;   Fair Recent;   Fair Remote;   Fair  Judgement:  Fair  Insight:  Fair  Psychomotor Activity:  Normal  Concentration:  Concentration: Fair and Attention Span: Fair  Recall:  [employer] of Knowledge: Fair  Language: Fair  Akathisia:  No  Handed:  Right  AIMS (if indicated): Denies tremors, rigidity,stiffness  Assets:  Communication Skills Desire for Improvement Social Support  ADL's:  Intact  Cognition: WNL  Sleep:  Fair   Screenings: GAD-7     Office Visit from 10/28/2016 in [facility] Office Visit from 10/08/2016 in [facility] Office Visit from 08/05/2016 in [facility]  Total GAD-7 Score  PHQ2-9     Office Visit from 12/16/2016 in [facility] Office Visit from 11/14/2016 in [facility] Office Visit from 11/11/2016 in [facility] Office Visit from 10/28/2016 in [facility] Office Visit from 09/10/2016 in [place]  PHQ-2 Total Score  PHQ-9 Total Score  Assessment and Plan: Dominic Barnes is a 28 year old Caucasian male, employed, single, lives in [place], has a history of anxiety disorder, chronic prostatitis, IBS, presented to clinic today for a follow-up visit.  Patient is biologically predisposed given his family history as well as medical problems.  He also has psychosocial stressors of recent health problems, relationship struggles.  Patient will continue to benefit from medication changes as well as psychotherapy sessions.  Plan Bipolar 2 disorder-unstable Increase risperidone to 2 mg p.o. nightly Increase Lamictal to 75 mg p.o. daily in divided dosage.  For generalized anxiety disorder-unstable Increase Lamictal as described above. Continue CBT. Continue Klonopin 0.5 mg as needed for severe anxiety attacks.  Patient has been limiting use.   Panic attacks- improving Continue psychotherapy sessions.  The following labs are pending-TSH, lipid panel, hemoglobin A1c, prolactin, vitamin B12, CBC, CMP.  Pending EKG.  Follow-up in clinic in 3 to 4 weeks or sooner if needed.  I have spent atleast 15 minutes face to face with patient today. More than 50 % of the time was spent for psychoeducation and supportive psychotherapy and care coordination.  This note was generated in part or whole with voice recognition software. Voice recognition is usually quite accurate but there are transcription errors that can and very often do occur. I apologize for any typographical errors that were not detected and corrected.       Jomarie Longs, MD 12/14/2018, 12:22 PM

## 2019-01-12 ENCOUNTER — Ambulatory Visit: Payer: BLUE CROSS/BLUE SHIELD | Admitting: Licensed Clinical Social Worker

## 2019-01-12 ENCOUNTER — Ambulatory Visit: Payer: BLUE CROSS/BLUE SHIELD | Admitting: Psychiatry

## 2019-01-25 ENCOUNTER — Telehealth: Payer: Self-pay

## 2019-01-25 DIAGNOSIS — F3181 Bipolar II disorder: Secondary | ICD-10-CM

## 2019-01-25 MED ORDER — RISPERIDONE 2 MG PO TABS: 2.0000 mg | ORAL_TABLET | Freq: Every day | ORAL | 1 refills | Status: DC

## 2019-01-25 NOTE — Telephone Encounter (Signed)
  received fax requesting a refill on risperidone 2mg  . pt was last seel on 12-14-18 , canceled appt on  01-12-19 and has a follow appt set up for 02-08-19   risperiDONE (RISPERDAL) 2 MG tablet  Medication  Date: 12/14/2018 Department: [facility] Ordering/Authorizing: Jomarie Longs, MD  Order Providers   Prescribing Provider Encounter Provider  Jomarie Longs, MD Jomarie Longs, MD  Outpatient Medication Detail    Disp Refills Start End   risperiDONE (RISPERDAL) 2 MG tablet 30 tablet 1 12/14/2018    Sig - Route: Take 1 tablet (2 mg total) by mouth at bedtime. - Oral   Sent to pharmacy as: risperiDONE (RISPERDAL) 2 MG tablet   E-Prescribing Status: Receipt confirmed by pharmacy (12/14/2018 9:50 AM EST)

## 2019-01-25 NOTE — Telephone Encounter (Signed)
Sent Risperidone to pharmacy. 

## 2019-02-08 ENCOUNTER — Other Ambulatory Visit: Payer: Self-pay

## 2019-02-08 ENCOUNTER — Encounter: Payer: Self-pay | Admitting: Psychiatry

## 2019-02-08 ENCOUNTER — Ambulatory Visit (INDEPENDENT_AMBULATORY_CARE_PROVIDER_SITE_OTHER): Payer: BLUE CROSS/BLUE SHIELD | Admitting: Psychiatry

## 2019-02-08 ENCOUNTER — Ambulatory Visit: Payer: BLUE CROSS/BLUE SHIELD | Admitting: Licensed Clinical Social Worker

## 2019-02-08 DIAGNOSIS — F3181 Bipolar II disorder: Secondary | ICD-10-CM

## 2019-02-08 DIAGNOSIS — F410 Panic disorder [episodic paroxysmal anxiety] without agoraphobia: Secondary | ICD-10-CM | POA: Diagnosis not present

## 2019-02-08 DIAGNOSIS — F411 Generalized anxiety disorder: Secondary | ICD-10-CM | POA: Diagnosis not present

## 2019-02-08 DIAGNOSIS — Z9189 Other specified personal risk factors, not elsewhere classified: Secondary | ICD-10-CM | POA: Diagnosis not present

## 2019-02-08 MED ORDER — LAMOTRIGINE 100 MG PO TABS: 100.0000 mg | ORAL_TABLET | Freq: Every day | ORAL | 1 refills | Status: DC

## 2019-02-08 NOTE — Progress Notes (Signed)
Virtual Visit via Telephone Note  I connected with Dominic Barnes on 02/08/19 at  1:00 PM EDT by telephone and verified that I am speaking with the correct person using two identifiers.   I discussed the limitations, risks, security and privacy concerns of performing an evaluation and management service by telephone and the availability of in person appointments. I also discussed with the patient that there may be a patient responsible charge related to this service. The patient expressed understanding and agreed to proceed.   I discussed the assessment and treatment plan with the patient. The patient was provided an opportunity to ask questions and all were answered. The patient agreed with the plan and demonstrated an understanding of the instructions.   The patient was advised to call back or seek an in-person evaluation if the symptoms worsen or if the condition fails to improve as anticipated.  BH MD  OP Progress Note  02/08/2019 2:13 PM Dominic HornRobert G Barnes  MRN:  [id number]  Chief Complaint:  Chief Complaint    Follow-up     HPI: Dominic MaduroRobert is a 28 year old Caucasian male, employed, lives in [place], has a history of anxiety , bipolar disorder, chronic prostatitis, IBS was evaluated by phone today.  Patient today reports he has noticed some improvement with the medications.  He is on Lamictal and risperidone.  He is tolerating the medications well.  He denies any side effects.  He reports his mood symptoms, anxiety as well as mood swings have improved.  He however is interested in a dosage increase of the Lamictal.  Patient reports sleep is improved.  Patient denies suicidality, homicidality or perceptual disturbances.  Patient reports he has not rescheduled appointments with his therapist yet due to financial issues.  Advised him to call his health insurance plan to find out about the cost.  Also discussed referral to the community to make it more affordable for you.  Provided him  education about panic attacks and the need for therapy.  Patient reports work is going well.  He reports he continues to stay with his brother and continues to have some relationship stressors however he is  coping okay.    visit Diagnosis:    ICD-10-CM   1. Bipolar 2 disorder, major depressive episode (HCC) F31.81 lamoTRIgine (LAMICTAL) 100 MG tablet  2. GAD (generalized anxiety disorder) F41.1 lamoTRIgine (LAMICTAL) 100 MG tablet  3. Panic attack F41.0   4. At risk for long QT syndrome Z91.89     Past Psychiatric History: Reviewed past psychiatric history from my progress note on 10/29/2018.  Past trials of Effexor, Lexapro, Paxil, Prozac  Past Medical History:  Past Medical History:  Diagnosis Date  . Anxiety   . 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 Asthma    exercise induced. 1 episiode. Approx age 28.  Dominic Barnes. Headache    sinus  . Sinus trouble   . Testicle pain     Past Surgical History:  Procedure Laterality Date  . ELBOW SURGERY     right  . IMAGE GUIDED SINUS SURGERY N/A 08/30/2015   Procedure: IMAGE GUIDED SINUS SURGERY;  Surgeon: Bud Facereighton Vaught, MD;  Location[facility];  Service: ENT;  Laterality: N/A;  GAVE DISK TO [name] 11/3  . MAXILLARY ANTROSTOMY Bilateral 08/30/2015   Procedure: MAXILLARY ANTROSTOMY;  Surgeon: Bud Facereighton Vaught, MD;  [facility];  Service: ENT;  Laterality: Bilateral;  . NASAL SINUS SURGERY    . SEPTOPLASTY N/A 08/30/2015  Procedure: SEPTOPLASTY;  Surgeon: Bud Face, MD;  Location: [facility];  Service: ENT;  Laterality: N/A;  . TONSILLECTOMY    . TURBINATE REDUCTION Bilateral 08/30/2015   Procedure: INFERIOR TURBINATE REDUCTION ;  Surgeon: Bud Face, MD;  Location: [facility];  Service: ENT;  Laterality: Bilateral;  . WISDOM TOOTH EXTRACTION      Family Psychiatric History: Reviewed family psychiatric history from my progress note on 10/29/2018.  Family History:  Family History   Problem Relation Age of Onset  . Cirrhosis Father   . Anxiety disorder Father   . Depression Father   . Alcohol abuse Father   . ADD / ADHD Brother   . Kidney disease Neg Hx   . Prostate cancer Neg Hx     Social History: Reviewed social history from my progress note on 10/29/2018 Social History   Socioeconomic History  . Marital status: Single    Spouse name: Not on file  . Number of children: 0  . Years of education: Not on file  . Highest education level: Some college, no degree  Occupational History  . Not on file  Social Needs  . Financial resource strain: Not very hard  . Food insecurity:    Worry: Never true    Inability: Never true  . Transportation needs:    Medical: No    Non-medical: No  Tobacco Use  . Smoking status: Former Smoker    Last attempt to quit: 07/21/2012    Years since quitting: 6.5  . Smokeless tobacco: Former Neurosurgeon  . Tobacco comment: quit 5 years /occasional dip  Substance and Sexual Activity  . Alcohol use: Yes    Alcohol/week: 12.0 standard drinks    Types: 12 Cans of beer per week    Comment: socially  . Drug use: Yes    Types: Marijuana    Comment: last used 2 weeks   . Sexual activity: Not Currently  Lifestyle  . Physical activity:    Days per week: 0 days    Minutes per session: 0 min  . Stress: Not on file  Relationships  . Social connections:    Talks on phone: Not on file    Gets together: Not on file    Attends religious service: Never    Active member of club or organization: No    Attends meetings of clubs or organizations: Never    Relationship status: Never married  Other Topics Concern  . Not on file  Social History Narrative  . Not on file    Allergies: No Known Allergies  Metabolic Disorder Labs: No results found for: HGBA1C, MPG No results found for: PROLACTIN No results found for: CHOL, TRIG, HDL, CHOLHDL, VLDL, LDLCALC No results found for: TSH  Therapeutic Level Labs: No results found for: LITHIUM No  results found for: VALPROATE No components found for:  CBMZ  Current Medications: Current Outpatient Medications  Medication Sig Dispense Refill  . alfuzosin (UROXATRAL) 10 MG 24 hr tablet Take 10 mg by mouth.    . clonazePAM (KLONOPIN) 0.5 MG tablet Take 1 tablet (0.5 mg total) by mouth as directed. ONLY FOR SEVERE PANIC ATTACKS 12 tablet 0  . lamoTRIgine (LAMICTAL) 100 MG tablet Take 1 tablet (100 mg total) by mouth daily. 30 tablet 1  . Multiple Vitamin (MULTIVITAMIN) tablet Take 1 tablet by mouth daily.    . risperiDONE (RISPERDAL) 2 MG tablet Take 1 tablet (2 mg total) by mouth at bedtime. 30 tablet 1  No current facility-administered medications for this visit.      Musculoskeletal: Strength & Muscle Tone: UTA Gait & Station: UTA Patient leans: N/A  Psychiatric Specialty Exam: Review of Systems  Psychiatric/Behavioral: The patient is nervous/anxious.   All other systems reviewed and are negative.   There were no vitals taken for this visit.There is no height or weight on file to calculate BMI.  General Appearance: UTA  Eye Contact:  UTA  Speech:  Normal Rate  Volume:  Normal  Mood:  Anxious  Affect:  UTA  Thought Process:  Goal Directed and Descriptions of Associations: Intact  Orientation:  Full (Time, Place, and Person)  Thought Content: Logical   Suicidal Thoughts:  No  Homicidal Thoughts:  No  Memory:  Immediate;   Fair Recent;   Fair Remote;   Fair  Judgement:  Fair  Insight:  Fair  Psychomotor Activity:  UTA  Concentration:  Concentration: Fair and Attention Span: Fair  Recall:  [employer] of Knowledge: Fair  Language: Fair  Akathisia:  No  Handed:  Right  AIMS (if indicated): UTA  Assets:  Communication Skills Desire for Improvement Housing Social Support Transportation  ADL's:  Intact  Cognition: WNL  Sleep:  Fair   Screenings: GAD-7     [facility] Visit from 10/28/2016 in [facility] Office Visit from 10/08/2016 in [facility] Office Visit from 08/05/2016 in [facility]  Total GAD-7 Score  PHQ2-9     Office Visit from 12/16/2016 in [facility] Office Visit from 11/14/2016 in [facility] Office Visit from 11/11/2016 in [facility] Office Visit from 10/28/2016 in [facility] Office Visit from 09/10/2016 in [place]  PHQ-2 Total Score  PHQ-9 Total Score  Assessment and Plan: Oseph is a 28 year old Caucasian male, employed, single, lives in [place], has a history of anxiety disorder, chronic prostatitis, IBS, was evaluated by phone today.  Patient is biologically predisposed given his family history as well as medical problems.  He also has psychosocial stressors of recent health problems, relationship struggles.  He reports improvement in his mood symptoms however does struggle with anxiety on and off.  Patient will continue to benefit from medication management and psychotherapy sessions.  Plan Bipolar 2 disorder- improving Risperidone 2 mg p.o. nightly Increase lamotrigine to 100 mg p.o. daily in divided dosage.    For generalized anxiety disorder- improving Lamictal as prescribed Continue CBT-patient advised to call his health insurance plan to find out about financial part.   Panic attacks-improving Continue psychotherapy sessions  Following labs pending-TSH, lipid panel, hemoglobin A1c, prolactin, vitamin B12, CBC, CMP. Pending EKG.  Follow-up in clinic in 4-5 weeks or sooner if needed.  I have spent atleast 15 minutes non face to face with patient today. More than 50 % of the time was spent for psychoeducation and supportive psychotherapy and care coordination.  This note was generated in part or whole with voice recognition software. Voice recognition is usually quite accurate but there are transcription errors that can and very often do  occur. I apologize for any typographical errors that were not detected and corrected.         Jomarie Longs, MD 02/08/2019, 2:13 PM

## 2019-03-01 NOTE — Progress Notes (Signed)
   THERAPIST PROGRESS NOTE  Session Time: 1 hour  Participation Level: Active  Behavioral Response: CasualAlertDepressed  Type of Therapy: Individual Therapy  Treatment Goals addressed: Coping  Interventions: CBT and Motivational Interviewing  Summary: Dominic Barnes is a 28 y.o. male who presents with continued symptoms.  Therapist met with Patient in an initial therapy session to assess current mood and to build rapport. Therapist engaged Patient in discussion about his life and what is going well for him. Therapist provided support for Patient as he shared details about life, current stressors, mood, coping skills, past, and his siblings (brother). Therapist prompted Patient to discuss his support system and ways that he manages his daily stress, anger, and frustrations.    LCSW discussed what psychotherapy is and is not and the importance of the therapeutic relationship to include open and honest communication between client and therapist and building trust.  Reviewed advantages and disadvantages of the therapeutic process and limitations to the therapeutic relationship including LCSW's role in maintaining the safety of the client, others and those in client's care.   Suicidal/Homicidal: No  Plan: Return again in 2 weeks.  Diagnosis: Axis I: Bipolar, Depressed    Axis II: No diagnosis    Lubertha South, LCSW 12/14/2018

## 2019-03-19 ENCOUNTER — Other Ambulatory Visit: Payer: Self-pay

## 2019-03-19 ENCOUNTER — Encounter: Payer: Self-pay | Admitting: Psychiatry

## 2019-03-19 ENCOUNTER — Ambulatory Visit (INDEPENDENT_AMBULATORY_CARE_PROVIDER_SITE_OTHER): Payer: BLUE CROSS/BLUE SHIELD | Admitting: Psychiatry

## 2019-03-19 DIAGNOSIS — F410 Panic disorder [episodic paroxysmal anxiety] without agoraphobia: Secondary | ICD-10-CM | POA: Diagnosis not present

## 2019-03-19 DIAGNOSIS — F411 Generalized anxiety disorder: Secondary | ICD-10-CM | POA: Diagnosis not present

## 2019-03-19 DIAGNOSIS — F3181 Bipolar II disorder: Secondary | ICD-10-CM | POA: Diagnosis not present

## 2019-03-19 MED ORDER — LAMOTRIGINE 25 MG PO TABS: 25.0000 mg | ORAL_TABLET | Freq: Every day | ORAL | 0 refills | Status: DC

## 2019-03-19 MED ORDER — RISPERIDONE 2 MG PO TABS: 2.0000 mg | ORAL_TABLET | Freq: Every day | ORAL | 1 refills | Status: DC

## 2019-03-19 MED ORDER — CLONAZEPAM 0.5 MG PO TABS: 0.5000 mg | ORAL_TABLET | ORAL | 0 refills | Status: DC

## 2019-03-19 MED ORDER — LAMOTRIGINE 100 MG PO TABS: 100.0000 mg | ORAL_TABLET | Freq: Every day | ORAL | 0 refills | Status: DC

## 2019-03-19 NOTE — Progress Notes (Signed)
Virtual Visit via Video Note  I connected with Dominic Barnes on 03/19/19 at  9:00 AM EDT by a video enabled telemedicine application and verified that I am speaking with the correct person using two identifiers.   I discussed the limitations of evaluation and management by telemedicine and the availability of in person appointments. The patient expressed understanding and agreed to proceed.    I discussed the assessment and treatment plan with the patient. The patient was provided an opportunity to ask questions and all were answered. The patient agreed with the plan and demonstrated an understanding of the instructions.   The patient was advised to call back or seek an in-person evaluation if the symptoms worsen or if the condition fails to improve as anticipated.   BH MD OP Progress Note  03/19/2019 1:02 PM Dominic Barnes  MRN:  [id number]  Chief Complaint:  Chief Complaint    Follow-up     HPI: Dominic MaduroRobert is a 28 year old Caucasian male, employed, lives in [place], has a history of anxiety, bipolar disorder, chronic prostatitis, IBS was evaluated by telemedicine today.  Patient is currently doing well on the current medication regimen.  Patient reports his mood symptoms is improved on the Lamictal and risperidone combination.  He however is interested in dosage increase of his Lamictal today.  He is compliant on the medications as prescribed.  He denies any side effects.  He reports sleep is improved.  He does report some on and off panic symptoms however all overall they have improved.  He reports has been limiting the use of Klonopin.  He uses it once every 1 to 2 weeks or so.  Patient reports sleep is good.  Patient reports his job has been more stressful due to the COVID-19 outbreak however is coping okay so far.  Patient denies any other concerns today. Visit Diagnosis:    ICD-10-CM   1. Bipolar 2 disorder, major depressive episode (HCC) F31.81 lamoTRIgine (LAMICTAL) 100 MG  tablet    lamoTRIgine (LAMICTAL) 25 MG tablet    risperiDONE (RISPERDAL) 2 MG tablet  2. GAD (generalized anxiety disorder) F41.1 lamoTRIgine (LAMICTAL) 100 MG tablet    lamoTRIgine (LAMICTAL) 25 MG tablet  3. Panic attack F41.0 clonazePAM (KLONOPIN) 0.5 MG tablet    Past Psychiatric History: I have reviewed past psychiatric history from my progress note on 10/29/2018.  Past trials of Effexor, Lexapro, Paxil, Prozac  Past Medical History:  Past Medical History:  Diagnosis Date  . Anxiety   . 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 Asthma    exercise induced. 1 episiode. Approx age 28.  [name]. Headache    sinus  . Sinus trouble   . Testicle pain     Past Surgical History:  Procedure Laterality Date  . ELBOW SURGERY     right  . IMAGE GUIDED SINUS SURGERY N/A 08/30/2015   Procedure: IMAGE GUIDED SINUS SURGERY;  Surgeon: Bud Facereighton Vaught, MD;  [facility];  Service: ENT;  Laterality: N/A;  GAVE DISK TO [name] 11/3  . MAXILLARY ANTROSTOMY Bilateral 08/30/2015   Procedure: MAXILLARY ANTROSTOMY;  Surgeon: Bud Facereighton Vaught, MD;  [facility];  Service: ENT;  Laterality: Bilateral;  . NASAL SINUS SURGERY    . SEPTOPLASTY N/A 08/30/2015   Procedure: SEPTOPLASTY;  Surgeon: Bud Facereighton Vaught, MD;  [facility];  Service: ENT;  Laterality: N/A;  . TONSILLECTOMY    . TURBINATE REDUCTION Bilateral 08/30/2015   Procedure: INFERIOR TURBINATE REDUCTION ;  Surgeon: Bud Face, MD;  [facility];  Service: ENT;  Laterality: Bilateral;  . WISDOM TOOTH EXTRACTION      Family Psychiatric History: I have reviewed family psychiatric history from my progress note on 10/29/2018.  Family History:  Family History  Problem Relation Age of Onset  . Cirrhosis Father   . Anxiety disorder Father   . Depression Father   . Alcohol abuse Father   . ADD / ADHD Brother   . Kidney disease Neg Hx   . Prostate cancer Neg Hx     Social  History: Reviewed social history from my progress note on 10/29/2018. Social History   Socioeconomic History  . Marital status: Single    Spouse name: Not on file  . Number of children: 0  . Years of education: Not on file  . Highest education level: Some college, no degree  Occupational History  . Not on file  Social Needs  . Financial resource strain: Not very hard  . Food insecurity:    Worry: Never true    Inability: Never true  . Transportation needs:    Medical: No    Non-medical: No  Tobacco Use  . Smoking status: Former Smoker    Last attempt to quit: 07/21/2012    Years since quitting: 6.6  . Smokeless tobacco: Former Neurosurgeon  . Tobacco comment: quit 5 years /occasional dip  Substance and Sexual Activity  . Alcohol use: Yes    Alcohol/week: 12.0 standard drinks    Types: 12 Cans of beer per week    Comment: socially  . Drug use: Yes    Types: Marijuana    Comment: last used 2 weeks   . Sexual activity: Not Currently  Lifestyle  . Physical activity:    Days per week: 0 days    Minutes per session: 0 min  . Stress: Not on file  Relationships  . Social connections:    Talks on phone: Not on file    Gets together: Not on file    Attends religious service: Never    Active member of club or organization: No    Attends meetings of clubs or organizations: Never    Relationship status: Never married  Other Topics Concern  . Not on file  Social History Narrative  . Not on file    Allergies: No Known Allergies  Metabolic Disorder Labs: No results found for: HGBA1C, MPG No results found for: PROLACTIN No results found for: CHOL, TRIG, HDL, CHOLHDL, VLDL, LDLCALC No results found for: TSH  Therapeutic Level Labs: No results found for: LITHIUM No results found for: VALPROATE No components found for:  CBMZ  Current Medications: Current Outpatient Medications  Medication Sig Dispense Refill  . alfuzosin (UROXATRAL) 10 MG 24 hr tablet Take 10 mg by mouth.    .  clonazePAM (KLONOPIN) 0.5 MG tablet Take 1 tablet (0.5 mg total) by mouth as directed. ONLY FOR SEVERE PANIC ATTACKS 1-2 times a months only. 10 tablet 0  . lamoTRIgine (LAMICTAL) 100 MG tablet Take 1 tablet (100 mg total) by mouth daily. To be combined with 25 mg 90 tablet 0  . lamoTRIgine (LAMICTAL) 25 MG tablet Take 1 tablet (25 mg total) by mouth daily. To be combined with 100 mg 90 tablet 0  . Multiple Vitamin (MULTIVITAMIN) tablet Take 1 tablet by mouth daily.    . risperiDONE (RISPERDAL) 2 MG tablet Take 1 tablet (2 mg total) by mouth at bedtime. 90 tablet 1  No current facility-administered medications for this visit.      Musculoskeletal: Strength & Muscle Tone: within normal limits Gait & Station: normal Patient leans: N/A  Psychiatric Specialty Exam: Review of Systems  Psychiatric/Behavioral: The patient is nervous/anxious.   All other systems reviewed and are negative.   There were no vitals taken for this visit.There is no height or weight on file to calculate BMI.  General Appearance: Casual  Eye Contact:  Fair  Speech:  Clear and Coherent  Volume:  Normal  Mood:  Anxious  Affect:  Appropriate  Thought Process:  Goal Directed and Descriptions of Associations: Intact  Orientation:  Full (Time, Place, and Person)  Thought Content: Logical   Suicidal Thoughts:  No  Homicidal Thoughts:  No  Memory:  Immediate;   Fair Recent;   Fair Remote;   Fair  Judgement:  Fair  Insight:  Fair  Psychomotor Activity:  Normal  Concentration:  Concentration: Fair and Attention Span: Fair  Recall:  [employer] of Knowledge: Fair  Language: Fair  Akathisia:  No  Handed:  Right  AIMS (if indicated): Denies tremors, rigidity, stiffness  Assets:  Communication Skills Desire for Improvement Social Support  ADL's:  Intact  Cognition: WNL  Sleep:  Fair   Screenings: GAD-7     [facility] Visit from 10/28/2016 in [facility] Office Visit from 10/08/2016 in [facility] Office Visit from 08/05/2016 in [facility]  Total GAD-7 Score  20  19  19     PHQ2-9     Office Visit from 12/16/2016 in [facility] Office Visit from 11/14/2016 in [facility] Office Visit from 11/11/2016 in [facility] Office Visit from 10/28/2016 in [facility] Office Visit from 09/10/2016 in [facility]  PHQ-2 Total Score  4  4  4  5  6   PHQ-9 Total Score  11  14  13  20  19        Assessment and Plan: Tavari is a 28 year old Caucasian male, employed, single, lives in [place], has a history of anxiety disorder, chronic prostatitis, IBS, bipolar disorder was evaluated by telemedicine today.  Patient is biologically predisposed given his family history as well as medical problems.  He also has psychosocial stressors of health problems, relationship struggles.  Patient however is making progress on the current medication regimen.  Plan as noted below.  Plan Bipolar 2 disorder-improving Risperidone 2 mg p.o. nightly Increase lamotrigine to 125 mg p.o. daily in divided dosage.  For GAD-improving Lamictal as prescribed. Continue CBT.  Panic attacks-improving Continue psychotherapy sessions. He does have Klonopin available as needed which he has been limiting use.  Follow-up in clinic in 1 month or sooner if needed.  Pending labs-TSH, lipid panel, hemoglobin A1c, prolactin, vitamin B12, CBC, CMP.  Pending EKG.  I have spent atleast 15 minutes non face to face with patient today. More than 50 % of the time was spent for psychoeducation and supportive psychotherapy and care coordination.  This note was generated in part or whole with voice recognition software. Voice recognition is usually quite accurate but there are transcription errors that can and very often do occur. I apologize for any typographical errors that were not detected and corrected.          Jomarie Longs,  MD 03/19/2019, 1:02 PM

## 2019-04-20 ENCOUNTER — Encounter: Payer: Self-pay | Admitting: Psychiatry

## 2019-04-20 ENCOUNTER — Other Ambulatory Visit: Payer: Self-pay

## 2019-04-20 ENCOUNTER — Ambulatory Visit (INDEPENDENT_AMBULATORY_CARE_PROVIDER_SITE_OTHER): Payer: BLUE CROSS/BLUE SHIELD | Admitting: Psychiatry

## 2019-04-20 DIAGNOSIS — F410 Panic disorder [episodic paroxysmal anxiety] without agoraphobia: Secondary | ICD-10-CM

## 2019-04-20 DIAGNOSIS — F3181 Bipolar II disorder: Secondary | ICD-10-CM

## 2019-04-20 DIAGNOSIS — F411 Generalized anxiety disorder: Secondary | ICD-10-CM | POA: Diagnosis not present

## 2019-04-20 MED ORDER — LAMOTRIGINE 25 MG PO TABS: 50.0000 mg | ORAL_TABLET | Freq: Every day | ORAL | 0 refills | Status: DC

## 2019-04-20 MED ORDER — RISPERIDONE 0.5 MG PO TABS: 0.5000 mg | ORAL_TABLET | Freq: Every day | ORAL | 0 refills | Status: DC

## 2019-04-20 NOTE — Progress Notes (Signed)
Virtual Visit via Video Note  I connected with Dominic Barnes on 04/20/19 at 10:45 AM EDT by a video enabled telemedicine application and verified that I am speaking with the correct person using two identifiers.   I discussed the limitations of evaluation and management by telemedicine and the availability of in person appointments. The patient expressed understanding and agreed to proceed.   I discussed the assessment and treatment plan with the patient. The patient was provided an opportunity to ask questions and all were answered. The patient agreed with the plan and demonstrated an understanding of the instructions.   The patient was advised to call back or seek an in-person evaluation if the symptoms worsen or if the condition fails to improve as anticipated.  BH MD OP Progress Note  04/20/2019 12:24 PM Dominic Barnes  MRN:  [id number]  Chief Complaint:  Chief Complaint    Follow-up     HPI: Dominic MaduroRobert is a 28 year old Caucasian male, employed, lives in [place], has a history of anxiety, bipolar disorder, IBS, chronic prostatitis was evaluated by telemedicine today.  Patient today reports he continues to struggle with some mood symptoms.  He reports the Lamictal and risperidone combination had helped improve his mood symptoms however recently he has been noticing some sadness, lack of motivation, social withdrawal.  He reports he goes to work 6 days a week and very busy at work.  On his day off he stays to himself and does not feel like going anywhere.  Patient likely also with psychosocial stressors of the current COVID-19 outbreak.  Reports he has not had psychotherapy sessions with his therapist in a long time.  Patient advised to reach out to Ms. Peacock to schedule an appointment.  Patient reports sleep is good.  Patient denies any suicidality, homicidality or perceptual disturbances.  Discussed readjusting his medication dosage, he agrees with plan. Visit Diagnosis:    ICD-10-CM    1. Bipolar 2 disorder, major depressive episode (HCC)  F31.81 risperiDONE (RISPERDAL) 0.5 MG tablet    lamoTRIgine (LAMICTAL) 25 MG tablet  2. GAD (generalized anxiety disorder)  F41.1 risperiDONE (RISPERDAL) 0.5 MG tablet    lamoTRIgine (LAMICTAL) 25 MG tablet  3. Panic attack  F41.0     Past Psychiatric History: I have reviewed past psychiatric history from my progress note on 10/29/2018.  Past trials of Effexor, Lexapro, Paxil, Prozac.  Past Medical History:  Past Medical History:  Diagnosis Date  . Anxiety   . 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 Asthma    exercise induced. 1 episiode. Approx age 28.  [name]. Headache    sinus  . Sinus trouble   . Testicle pain     Past Surgical History:  Procedure Laterality Date  . ELBOW SURGERY     right  . IMAGE GUIDED SINUS SURGERY N/A 08/30/2015   Procedure: IMAGE GUIDED SINUS SURGERY;  Surgeon: Bud Facereighton Vaught, MD;  [facility];  Service: ENT;  Laterality: N/A;  GAVE DISK TO [name] 11/3  . MAXILLARY ANTROSTOMY Bilateral 08/30/2015   Procedure: MAXILLARY ANTROSTOMY;  Surgeon: Bud Facereighton Vaught, MD;  [facility];  Service: ENT;  Laterality: Bilateral;  . NASAL SINUS SURGERY    . SEPTOPLASTY N/A 08/30/2015   Procedure: SEPTOPLASTY;  Surgeon: Bud Facereighton Vaught, MD;  [facility];  Service: ENT;  Laterality: N/A;  . TONSILLECTOMY    . TURBINATE REDUCTION Bilateral 08/30/2015   Procedure: INFERIOR TURBINATE REDUCTION ;  Surgeon: Bud Facereighton Vaught,  MD;  [facility];  Service: ENT;  Laterality: Bilateral;  . WISDOM TOOTH EXTRACTION      Family Psychiatric History: I have reviewed family psychiatric history from my progress note on 10/29/2018.  Family History:  Family History  Problem Relation Age of Onset  . Cirrhosis Father   . Anxiety disorder Father   . Depression Father   . Alcohol abuse Father   . ADD / ADHD Brother   . Kidney disease Neg Hx   . Prostate cancer Neg  Hx     Social History: I have reviewed social history from my progress note on 10/29/2018. Social History   Socioeconomic History  . Marital status: Single    Spouse name: Not on file  . Number of children: 0  . Years of education: Not on file  . Highest education level: Some college, no degree  Occupational History  . Not on file  Social Needs  . Financial resource strain: Not very hard  . Food insecurity    Worry: Never true    Inability: Never true  . Transportation needs    Medical: No    Non-medical: No  Tobacco Use  . Smoking status: Former Smoker    Quit date: 07/21/2012    Years since quitting: 6.7  . Smokeless tobacco: Former NeurosurgeonUser  . Tobacco comment: quit 5 years /occasional dip  Substance and Sexual Activity  . Alcohol use: Yes    Alcohol/week: 12.0 standard drinks    Types: 12 Cans of beer per week    Comment: socially  . Drug use: Yes    Types: Marijuana    Comment: last used 2 weeks   . Sexual activity: Not Currently  Lifestyle  . Physical activity    Days per week: 0 days    Minutes per session: 0 min  . Stress: Not on file  Relationships  . Social Musicianconnections    Talks on phone: Not on file    Gets together: Not on file    Attends religious service: Never    Active member of club or organization: No    Attends meetings of clubs or organizations: Never    Relationship status: Never married  Other Topics Concern  . Not on file  Social History Narrative  . Not on file    Allergies: No Known Allergies  Metabolic Disorder Labs: No results found for: HGBA1C, MPG No results found for: PROLACTIN No results found for: CHOL, TRIG, HDL, CHOLHDL, VLDL, LDLCALC No results found for: TSH  Therapeutic Level Labs: No results found for: LITHIUM No results found for: VALPROATE No components found for:  CBMZ  Current Medications: Current Outpatient Medications  Medication Sig Dispense Refill  . alfuzosin (UROXATRAL) 10 MG 24 hr tablet Take 10 mg by  mouth.    . clonazePAM (KLONOPIN) 0.5 MG tablet Take 1 tablet (0.5 mg total) by mouth as directed. ONLY FOR SEVERE PANIC ATTACKS 1-2 times a months only. 10 tablet 0  . lamoTRIgine (LAMICTAL) 100 MG tablet Take 1 tablet (100 mg total) by mouth daily. To be combined with 25 mg 90 tablet 0  . lamoTRIgine (LAMICTAL) 25 MG tablet Take 2 tablets (50 mg total) by mouth daily. To be combined with 100 mg-patient has supplie 90 tablet 0  . Multiple Vitamin (MULTIVITAMIN) tablet Take 1 tablet by mouth daily.    . risperiDONE (RISPERDAL) 0.5 MG tablet Take 1 tablet (0.5 mg total) by mouth at bedtime. To be combined with 2  mg 90 tablet 0  . risperiDONE (RISPERDAL) 2 MG tablet Take 1 tablet (2 mg total) by mouth at bedtime. 90 tablet 1   No current facility-administered medications for this visit.      Musculoskeletal: Strength & Muscle Tone: within normal limits Gait & Station: normal Patient leans: N/A  Psychiatric Specialty Exam: Review of Systems  Psychiatric/Behavioral: Positive for depression.  All other systems reviewed and are negative.   There were no vitals taken for this visit.There is no height or weight on file to calculate BMI.  General Appearance: Casual  Eye Contact:  Fair  Speech:  Normal Rate  Volume:  Normal  Mood:  Depressed  Affect:  Appropriate  Thought Process:  Goal Directed and Descriptions of Associations: Intact  Orientation:  Full (Time, Place, and Person)  Thought Content: Logical   Suicidal Thoughts:  No  Homicidal Thoughts:  No  Memory:  Immediate;   Fair Recent;   Fair Remote;   Fair  Judgement:  Fair  Insight:  Fair  Psychomotor Activity:  Normal  Concentration:  Concentration: Fair and Attention Span: Fair  Recall:  [employer]  Fund of Knowledge: Fair  Language: Fair  Akathisia:  No  Handed:  Right  AIMS (if indicated): denies tremors, rigidity  Assets:  Communication Skills Desire for Improvement Housing Social Support  ADL's:  Intact  Cognition:  WNL  Sleep:  Fair   Screenings: GAD-7     [facility] from [facility] Office Visit from [facility]05/2016 [facility]  Total GAD-7 Score  20  19  19     PHQ2-9     [facility]2016 [facility] Office Visit from [facility]/14/2016 [facility] Office [facility] from [facility] Office [facility] from 10/28/2016 [facility] Office Visit from 09/10/2016 in [facility]  PHQ-2 Total Score  4  4  4  5  6   PHQ-9 Total Score  11  14  13  20  19        Assessment and Plan: Dominic MaduroRobert is a 28 year old Caucasian male, employed, single, lives in [place], has a history of bipolar disorder, GAD, panic attacks, chronic prostatitis, IBS was evaluated by telemedicine today.  Patient is biologically predisposed given his family history as well as medical problems.  He also has psychosocial stressors of health problems, relationship struggles.  Patient currently also has a COVID-19 outbreak stressors.  He will benefit from medication readjustment as well as psychotherapy sessions since he continues to struggle with depressive symptoms.  Plan Bipolar disorder type II- some improvement Increase risperidone to 2.5 mg p.o. nightly Increase lamotrigine to 150 mg p.o. daily in divided dosage  For GAD- improving Lamictal as prescribed Patient advised to reach out to Ms. Peacock to restart psychotherapy sessions  For panic attacks- improving Continue psychotherapy sessions He does have Klonopin available as needed which he has been limiting use  Follow-up in clinic in 1 month or sooner if needed.  August 12 at 8:45 AM.  Pending labs-TSH, lipid panel, hemoglobin A1c, prolactin, vitamin B12, CBC, CMP-EKG-patient reports he will get it done with his PMD.  I have spent atleast 15 minutes non face to face with patient today. More  than 50 % of the time was spent for psychoeducation and supportive psychotherapy and care coordination.  This note was generated in part or  whole with voice recognition software. Voice recognition is usually quite accurate but there are transcription errors that can and very often do occur. I apologize for any typographical errors that were not detected and corrected.       Ursula Alert, MD 04/20/2019, 12:24 PM

## 2019-06-02 ENCOUNTER — Ambulatory Visit (INDEPENDENT_AMBULATORY_CARE_PROVIDER_SITE_OTHER): Payer: BLUE CROSS/BLUE SHIELD | Admitting: Psychiatry

## 2019-06-02 ENCOUNTER — Encounter: Payer: Self-pay | Admitting: Psychiatry

## 2019-06-02 ENCOUNTER — Other Ambulatory Visit: Payer: Self-pay

## 2019-06-02 DIAGNOSIS — F410 Panic disorder [episodic paroxysmal anxiety] without agoraphobia: Secondary | ICD-10-CM | POA: Diagnosis not present

## 2019-06-02 DIAGNOSIS — F411 Generalized anxiety disorder: Secondary | ICD-10-CM

## 2019-06-02 DIAGNOSIS — F3181 Bipolar II disorder: Secondary | ICD-10-CM

## 2019-06-02 MED ORDER — LAMOTRIGINE 100 MG PO TABS
100.0000 mg | ORAL_TABLET | Freq: Every day | ORAL | 0 refills | Status: DC
Start: 2019-06-02 — End: 2020-06-22

## 2019-06-02 MED ORDER — LAMOTRIGINE 25 MG PO TABS: 50.0000 mg | ORAL_TABLET | Freq: Every day | ORAL | 0 refills | Status: DC

## 2019-06-02 MED ORDER — RISPERIDONE 0.5 MG PO TABS: 0.5000 mg | ORAL_TABLET | Freq: Every day | ORAL | 0 refills | Status: DC

## 2019-06-02 NOTE — Progress Notes (Signed)
Virtual Visit via Video Note  I connected with Zenith G Haught on 06/02/19 at  8:45 AM EDT by a video enabled telemedicine application and verified that I am speaking with the correctJudeth Barnes person using two identifiers.   I discussed the limitations of evaluation and management by telemedicine and the availability of in person appointments. The patient expressed understanding and agreed to proceed.    I discussed the assessment and treatment plan with the patient. The patient was provided an opportunity to ask questions and all were answered. The patient agreed with the plan and demonstrated an understanding of the instructions.   The patient was advised to call back or seek an in-person evaluation if the symptoms worsen or if the condition fails to improve as anticipated.   BH MD OP Progress Note  06/02/2019 11:40 AM Dominic HornRobert G Hascall  MRN:  [id number]  Chief Complaint:  Chief Complaint    Follow-up     HPI: Molly MaduroRobert is a 28 year old Caucasian male, employed, lives in [place], has a history of anxiety, bipolar disorder, IBS, chronic prostatitis was evaluated by telemedicine today.  Patient today reports he is currently doing well on the current medication regimen.  Risperidone does help him to sleep better at night.  It also helps with his anxiety when he wakes up in the morning.  He continues to be compliant with medications.  He denies side effects.  Patient however reports he does not want to entirely depend on medications to cope with his stressors.  He reports he is currently looking for a new job.  He feels he is ready for change.  He also wants to take a break from work for at least a few weeks since he has been working too hard the past few months.  Patient reports sleep is good.  He denies any suicidality, homicidality or perceptual disturbances.  Patient reports he has not followed up with his therapist since he feels he does not need it.  He denies any other concerns today. Visit  Diagnosis:    ICD-10-CM   1. Bipolar 2 disorder, major depressive episode (HCC)  F31.81 risperiDONE (RISPERDAL) 0.5 MG tablet    lamoTRIgine (LAMICTAL) 100 MG tablet    lamoTRIgine (LAMICTAL) 25 MG tablet   improving  2. GAD (generalized anxiety disorder)  F41.1 risperiDONE (RISPERDAL) 0.5 MG tablet    lamoTRIgine (LAMICTAL) 100 MG tablet    lamoTRIgine (LAMICTAL) 25 MG tablet  3. Panic attack  F41.0     Past Psychiatric History: I have reviewed past psychiatric history from my progress note on 10/29/2018.  Past trials of Effexor, Lexapro, Paxil, Prozac.  Past Medical History:  Past Medical History:  Diagnosis Date  . Anxiety   . 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 Asthma    exercise induced. 1 episiode. Approx age 28.  Dominic Barnes. Headache    sinus  . Sinus trouble   . Testicle pain     Past Surgical History:  Procedure Laterality Date  . ELBOW SURGERY     right  . IMAGE GUIDED SINUS SURGERY N/A 08/30/2015   Procedure: IMAGE GUIDED SINUS SURGERY;  Surgeon: Bud Facereighton Vaught, MD;  [facility];  Service: ENT;  Laterality: N/A;  GAVE DISK TO [name] 11/3  . MAXILLARY ANTROSTOMY Bilateral 08/30/2015   Procedure: MAXILLARY ANTROSTOMY;  Surgeon: Bud Facereighton Vaught, MD;  [facility];  Service: ENT;  Laterality: Bilateral;  . NASAL SINUS SURGERY    . SEPTOPLASTY N/A 08/30/2015  Procedure: SEPTOPLASTY;  Surgeon: Bud Facereighton Vaught, MD;  [facility];  Service: ENT;  Laterality: N/A;  . TONSILLECTOMY    . TURBINATE REDUCTION Bilateral 08/30/2015   Procedure: INFERIOR TURBINATE REDUCTION ;  Surgeon: Bud Facereighton Vaught, MD;  [facility];  Service: ENT;  Laterality: Bilateral;  . WISDOM TOOTH EXTRACTION      Family Psychiatric History: I have reviewed family psychiatric history from my progress note on 10/29/2018.  Family History:  Family History  Problem Relation Age of Onset  . Cirrhosis Father   . Anxiety disorder Father   .  Depression Father   . Alcohol abuse Father   . ADD / ADHD Brother   . Kidney disease Neg Hx   . Prostate cancer Neg Hx     Social History: I have reviewed social history from my progress note on 10/29/2018. Social History   Socioeconomic History  . Marital status: Single    Spouse name: Not on file  . Number of children: 0  . Years of education: Not on file  . Highest education level: Some college, no degree  Occupational History  . Not on file  Social Needs  . Financial resource strain: Not very hard  . Food insecurity    Worry: Never true    Inability: Never true  . Transportation needs    Medical: No    Non-medical: No  Tobacco Use  . Smoking status: Former Smoker    Quit date: 07/21/2012    Years since quitting: 6.8  . Smokeless tobacco: Former NeurosurgeonUser  . Tobacco comment: quit 5 years /occasional dip  Substance and Sexual Activity  . Alcohol use: Yes    Alcohol/week: 12.0 standard drinks    Types: 12 Cans of beer per week    Comment: socially  . Drug use: Yes    Types: Marijuana    Comment: last used 2 weeks   . Sexual activity: Not Currently  Lifestyle  . Physical activity    Days per week: 0 days    Minutes per session: 0 min  . Stress: Not on file  Relationships  . Social Musicianconnections    Talks on phone: Not on file    Gets together: Not on file    Attends religious service: Never    Active member of club or organization: No    Attends meetings of clubs or organizations: Never    Relationship status: Never married  Other Topics Concern  . Not on file  Social History Narrative  . Not on file    Allergies: No Known Allergies  Metabolic Disorder Labs: No results found for: HGBA1C, MPG No results found for: PROLACTIN No results found for: CHOL, TRIG, HDL, CHOLHDL, VLDL, LDLCALC No results found for: TSH  Therapeutic Level Labs: No results found for: LITHIUM No results found for: VALPROATE No components found for:  CBMZ  Current Medications: Current  Outpatient Medications  Medication Sig Dispense Refill  . alfuzosin (UROXATRAL) 10 MG 24 hr tablet Take 10 mg by mouth.    . clonazePAM (KLONOPIN) 0.5 MG tablet Take 1 tablet (0.5 mg total) by mouth as directed. ONLY FOR SEVERE PANIC ATTACKS 1-2 times a months only. 10 tablet 0  . lamoTRIgine (LAMICTAL) 100 MG tablet Take 1 tablet (100 mg total) by mouth daily. To be combined with 25 mg 90 tablet 0  . lamoTRIgine (LAMICTAL) 25 MG tablet Take 2 tablets (50 mg total) by mouth daily. To be combined with 100 mg 180  tablet 0  . Multiple Vitamin (MULTIVITAMIN) tablet Take 1 tablet by mouth daily.    . risperiDONE (RISPERDAL) 0.5 MG tablet Take 1 tablet (0.5 mg total) by mouth at bedtime. To be combined with 2 mg 90 tablet 0  . risperiDONE (RISPERDAL) 2 MG tablet Take 1 tablet (2 mg total) by mouth at bedtime. 90 tablet 1   No current facility-administered medications for this visit.      Musculoskeletal: Strength & Muscle Tone: UTA Gait & Station: normal Patient leans: N/A  Psychiatric Specialty Exam: Review of Systems  Psychiatric/Behavioral: The patient is nervous/anxious.   All other systems reviewed and are negative.   There were no vitals taken for this visit.There is no height or weight on file to calculate BMI.  General Appearance: Casual  Eye Contact:  Fair  Speech:  Clear and Coherent  Volume:  Normal  Mood:  Anxious improving  Affect:  Congruent  Thought Process:  Goal Directed and Descriptions of Associations: Intact  Orientation:  Full (Time, Place, and Person)  Thought Content: Logical   Suicidal Thoughts:  No  Homicidal Thoughts:  No  Memory:  Immediate;   Fair Recent;   Fair Remote;   Fair  Judgement:  Fair  Insight:  Fair  Psychomotor Activity:  Normal  Concentration:  Concentration: Fair and Attention Span: Fair  Recall:  [employer] of Knowledge: Fair  Language: Fair  Akathisia:  No  Handed:  Right  AIMS (if indicated): Denies tremors, rigidity  Assets:   Communication Skills Desire for Improvement Social Support  ADL's:  Intact  Cognition: WNL  Sleep:  Fair   Screenings: GAD-7     Office Visit from 10/28/2016 in [facility] Office Visit from 10/08/2016 in [facility] Office Visit from 08/05/2016 in [facility]  Total GAD-7 Score  20  19  19     PHQ2-9     Office Visit from 12/16/2016 in [facility] Office Visit from 11/14/2016 in [facility] Office Visit from 11/11/2016 in [facility] Office Visit from 10/28/2016 in [facility] Office Visit from 09/10/2016 in [place]  PHQ-2 Total Score  4  4  4  5  6   PHQ-9 Total Score  11  14  13  20  19        Assessment and Plan: Darral is a 28 year old Caucasian male, employed, single, lives in [place], has a history of bipolar disorder, GAD, panic attacks, chronic prostatitis, IBS was evaluated by telemedicine today.  Patient is biologically predisposed given his family history as well as medical problems.  He also has psychosocial stressors of health problems, relationship struggles.  Patient reports he is currently doing well on medications.  He declined psychotherapy sessions and wants to make changes in his life himself and does not believe he needs therapy at this time.  Plan For bipolar disorder type II-improving Risperidone 2.5 mg p.o. nightly Lamotrigine 150 mg p.o. daily in divided dosage  For GAD-improving Lamictal as prescribed   For panic attacks-improving Patient declined psychotherapy sessions He does have Klonopin available as needed which has been limiting use.  Follow-up in clinic in 2 to 3 months or sooner if needed.  Pending labs-TSH, lipid panel, hemoglobin A1c, prolactin, vitamin B12, CBC, CMP-EKG.  Patient reports he will get it done from his PMD.  Appointment scheduled for November 16 at 10 AM  I have spent atleast 15 minutes  non face to face with  patient today. More than 50 % of the time was spent for psychoeducation and supportive psychotherapy and care coordination.   This note was generated in part or whole with voice recognition software. Voice recognition is usually quite accurate but there are transcription errors that can and very often do occur. I apologize for any typographical errors that were not detected and corrected.       Jomarie LongsSaramma Shanen Norris, MD 06/02/2019, 11:40 AM

## 2019-08-12 ENCOUNTER — Telehealth: Payer: Self-pay

## 2019-08-12 DIAGNOSIS — F3181 Bipolar II disorder: Secondary | ICD-10-CM

## 2019-08-12 DIAGNOSIS — F411 Generalized anxiety disorder: Secondary | ICD-10-CM

## 2019-08-12 MED ORDER — RISPERIDONE 0.5 MG PO TABS: 0.5000 mg | ORAL_TABLET | Freq: Every day | ORAL | 0 refills | Status: DC

## 2019-08-12 NOTE — Telephone Encounter (Signed)
Sent Risperidone 0.5 mg to pharmacy.

## 2019-08-12 NOTE — Telephone Encounter (Signed)
received fax requesting a refill on risperidone .5mg 

## 2019-09-06 ENCOUNTER — Ambulatory Visit: Payer: BLUE CROSS/BLUE SHIELD | Admitting: Psychiatry

## 2020-05-25 ENCOUNTER — Other Ambulatory Visit: Payer: Self-pay | Admitting: Family Medicine

## 2020-05-25 ENCOUNTER — Other Ambulatory Visit: Payer: Self-pay

## 2020-05-25 ENCOUNTER — Ambulatory Visit (INDEPENDENT_AMBULATORY_CARE_PROVIDER_SITE_OTHER): Payer: 59 | Admitting: Family Medicine

## 2020-05-25 ENCOUNTER — Encounter: Payer: Self-pay | Admitting: Family Medicine

## 2020-05-25 VITALS — BP 132/91 | HR 76 | Temp 98.2°F | Ht 72.0 in | Wt 161.6 lb

## 2020-05-25 DIAGNOSIS — F411 Generalized anxiety disorder: Secondary | ICD-10-CM | POA: Diagnosis not present

## 2020-05-25 DIAGNOSIS — R6883 Chills (without fever): Secondary | ICD-10-CM

## 2020-05-25 DIAGNOSIS — F410 Panic disorder [episodic paroxysmal anxiety] without agoraphobia: Secondary | ICD-10-CM | POA: Diagnosis not present

## 2020-05-25 DIAGNOSIS — G4700 Insomnia, unspecified: Secondary | ICD-10-CM | POA: Diagnosis not present

## 2020-05-25 DIAGNOSIS — F419 Anxiety disorder, unspecified: Secondary | ICD-10-CM

## 2020-05-25 MED ORDER — HYDROXYZINE HCL 25 MG PO TABS: 12.5000 mg | ORAL_TABLET | Freq: Three times a day (TID) | ORAL | 0 refills | Status: DC | PRN

## 2020-05-25 MED ORDER — TRAZODONE HCL 50 MG PO TABS: 25.0000 mg | ORAL_TABLET | Freq: Every evening | ORAL | 3 refills | Status: DC | PRN

## 2020-05-25 MED ORDER — CLONAZEPAM 0.5 MG PO TABS: 0.5000 mg | ORAL_TABLET | ORAL | 0 refills | Status: DC

## 2020-05-25 NOTE — Assessment & Plan Note (Addendum)
GAD7-18, [id number].  Reports history of situational anxiety and depression, causing panic attacks and insomnia.  Was previously on lamotrigine 150mg  daily and risperidone 2.5mg  nightly.  Reports had change in his insurance and it will take a few months to get in with his previous provider, looking for assistance until he can get seen.  Tearful during exam/interview.  Provided with contact information for [facility], , [employer] and [employer].  Discussed can fill his clonazepam for a ONE TIME fill to help with his panic attacks until he is established for medication management.  Interested in PRN medications for anxiety, discussed hydroxyzine vs. Buspar.  Interested in hydroxyzine.  Reports fleeting SI, denies any plan and reports would seek help if fleeting SI became constant.  Plan: 1. Begin hydroxyzine 12.5-25mg  TID PRN for anxiety 2. ONE TIME fill of clonazepam 0.5mg  PRN for panic attacks, 10 tablets given, instructed to schedule appt with psychiatry for medication management 3. Mood handout provided 4. RTC in 4 weeks

## 2020-05-25 NOTE — Assessment & Plan Note (Signed)
Reports nocturnal panic attacks nightly.  Finds these wake him up in the middle of the night.  Discussed PRN usage of one time rx for clonazepam.  Patient in agreement and reports will find psychiatric provider.

## 2020-05-25 NOTE — Progress Notes (Signed)
 Subjective:    Patient ID: Dominic Barnes, male    DOB: 06/17/1991, 29 y.o.   MRN: [id number]  Dominic Barnes is a 29 y.o. male presenting on 05/25/2020 for Anxiety (Situational anxiety. pt state that he is currently working 60hrs a week w/ no time off.  Sexual drive decrease , finiacial issues ) and Chills (intermittent colds. Pt states if the temperature is below 80's he is cold w/ chills.)   HPI  Dominic Barnes presents to clinic for concerns of anxiety and chills.  He has previously met with [facility] for anxiety, panic attacks, and reported bipolar 2 disorder.  Has stopped seeing a provided regularly due to insurance changes and out of pocket cost.  Has been off of his medications for quite a while.  Reports increasing anxiety recently with increased work demand and finds help reducing his anxiety through distraction techniques.  Reports has nocturnal anxiety/panic attacks because he is unable to be distracted when he goes to bed/is sleeping.  Has contacted [facility] for an appointment, reports was given a date in October and believes he needs to see someone before that.  Denies HI, has fleeting SI, reports as thinking about starting life over through reincarnation, has no plan and reports these thoughts come/go quickly.    Depression screen PHQ 2/9 05/25/2020 12/16/2016 11/14/2016  Decreased Interest 2 2 1  Down, Depressed, Hopeless 3 2 3  PHQ - 2 Score 5 4 4  Altered sleeping 3 1 3  Tired, decreased energy 2 1 2  Change in appetite 1 2 0  Feeling bad or failure about yourself  3 1 0  Trouble concentrating 3 0 3  Moving slowly or fidgety/restless 1 2 1  Suicidal thoughts 2 0 1  PHQ-9 Score 20 11 14  Difficult doing work/chores Very difficult Somewhat difficult Somewhat difficult    Social History   Tobacco Use  . Smoking status: Former Smoker    Quit date: 07/21/2012    Years since quitting: 7.8  . Smokeless tobacco: Former User  . Tobacco comment: quit 5 years /occasional dip  Vaping Use    . Vaping Use: Never used  Substance Use Topics  . Alcohol use: Yes    Alcohol/week: 12.0 standard drinks    Types: 12 Cans of beer per week    Comment: socially  . Drug use: Yes    Types: Marijuana    Comment: last used 2 weeks     Review of Systems  Constitutional: Positive for chills. Negative for activity change, appetite change, diaphoresis, fatigue, fever and unexpected weight change.  HENT: Negative.   Eyes: Negative.   Respiratory: Negative.   Cardiovascular: Negative.   Gastrointestinal: Negative.   Endocrine: Negative.   Genitourinary: Negative.   Musculoskeletal: Negative.   Skin: Negative.   Allergic/Immunologic: Negative.   Neurological: Negative.   Hematological: Negative.   Psychiatric/Behavioral: Positive for dysphoric mood, sleep disturbance and suicidal ideas. Negative for agitation, behavioral problems, confusion, decreased concentration, hallucinations and self-injury. The patient is nervous/anxious. The patient is not hyperactive.    Per HPI unless specifically indicated above     Objective:    BP (!) 132/91 (BP Location: Left Arm, Patient Position: Sitting, Cuff Size: Normal)   Pulse 76   Temp 98.2 F (36.8 C) (Oral)   Ht 6' (1.829 m)   Wt 161 lb 9.6 oz (73.3 kg)   BMI 21.92 kg/m   Wt Readings from Last 3 Encounters:  05/25/20 161 lb 9.6 oz (  73.3 kg)  08/12/18 160 lb 6.4 oz (72.8 kg)  06/11/18 153 lb (69.4 kg)    Physical Exam Vitals reviewed.  Constitutional:      General: He is not in acute distress.    Appearance: Normal appearance. He is well-developed, well-groomed and normal weight. He is not ill-appearing or toxic-appearing.  HENT:     Head: Normocephalic and atraumatic.     Nose:     Comments: Facemask is in place, covering mouth and nose. Eyes:     General: Lids are normal. Vision grossly intact.        Right eye: No discharge.        Left eye: No discharge.     Extraocular Movements: Extraocular movements intact.      Conjunctiva/sclera: Conjunctivae normal.     Pupils: Pupils are equal, round, and reactive to light.  Cardiovascular:     Pulses: Normal pulses.  Pulmonary:     Effort: Pulmonary effort is normal. No respiratory distress.  Musculoskeletal:     Right lower leg: No edema.     Left lower leg: No edema.  Skin:    General: Skin is warm and dry.     Capillary Refill: Capillary refill takes less than 2 seconds.  Neurological:     General: No focal deficit present.     Mental Status: He is alert and oriented to person, place, and time.     Cranial Nerves: No cranial nerve deficit.     Sensory: No sensory deficit.     Motor: No weakness.     Gait: Gait normal.  Psychiatric:        Attention and Perception: Attention and perception normal.        Mood and Affect: Mood is anxious and depressed. Affect is tearful.        Speech: Speech normal.        Behavior: Behavior normal. Behavior is cooperative.        Thought Content: Thought content includes suicidal ideation.        Cognition and Memory: Cognition and memory normal.     Comments: Fleeting SI thoughts of reincarnation and starting life over, no plan.    Results for orders placed or performed in visit on 06/11/18  POCT Urinalysis Dipstick  Result Value Ref Range   Color, UA yellow    Clarity, UA clear    Glucose, UA Negative Negative   Bilirubin, UA negative    Ketones, UA negative    Spec Grav, UA 1.010 1.010 - 1.025   Blood, UA negative    pH, UA 6.5 5.0 - 8.0   Protein, UA Negative Negative   Urobilinogen, UA 0.2 0.2 or 1.0 E.U./dL   Nitrite, UA negative    Leukocytes, UA Negative Negative   Appearance CLEAR    Odor        Assessment & Plan:   Problem List Items Addressed This Visit      Other   GAD (generalized anxiety disorder) - Primary    GAD7-18, PHQ9-20.  Reports history of situational anxiety and depression, causing panic attacks and insomnia.  Was previously on lamotrigine 150mg daily and risperidone 2.5mg  nightly.  Reports had change in his insurance and it will take a few months to get in with his previous provider, looking for assistance until he can get seen.  Tearful during exam/interview.  Provided with contact information for [facility], [facility], [facility] and [facility].  Discussed can fill his clonazepam for a   ONE TIME fill to help with his panic attacks until he is established for medication management.  Interested in PRN medications for anxiety, discussed hydroxyzine vs. Buspar.  Interested in hydroxyzine.  Reports fleeting SI, denies any plan and reports would seek help if fleeting SI became constant.  Plan: 1. Begin hydroxyzine 12.5-25mg TID PRN for anxiety 2. ONE TIME fill of clonazepam 0.5mg PRN for panic attacks, 10 tablets given, instructed to schedule appt with psychiatry for medication management 3. Mood handout provided 4. RTC in 4 weeks      Relevant Medications   traZODone (DESYREL) 50 MG tablet   hydrOXYzine (ATARAX/VISTARIL) 25 MG tablet   Other Relevant Orders   CBC with Differential   COMPLETE METABOLIC PANEL WITH GFR   Thyroid Panel With TSH   Panic attack    Reports nocturnal panic attacks nightly.  Finds these wake him up in the middle of the night.  Discussed PRN usage of one time rx for clonazepam.  Patient in agreement and reports will find psychiatric provider.      Relevant Medications   traZODone (DESYREL) 50 MG tablet   hydrOXYzine (ATARAX/VISTARIL) 25 MG tablet   clonazePAM (KLONOPIN) 0.5 MG tablet   Insomnia    Reviewed sleep hygiene and nocturnal anxiety/panic attacks.  Discussed option of using trazodone 25-50mg PRN for sleep to help with insomnia and nocturnal anxiety.  Patient interested in starting. Reviewed sleep hygiene handout.  Plan: 1. Can take trazodone 25-50mg QHS PRN for insomnia 2. Review sleep hygiene handout 3. RTC in 4 weeks, sooner, if needed      Relevant Medications   traZODone (DESYREL) 50 MG tablet   Chills    Reported chills when  temperature below 80 degrees.  No history of anemia, electrolyte disturbance or thyroid issues.  Has not had these labs checked in years.  Will have labs drawn for CBC, CMP, and Thyroid profile.  RTC in 4 weeks.         Meds ordered this encounter  Medications  . traZODone (DESYREL) 50 MG tablet    Sig: Take 0.5-1 tablets (25-50 mg total) by mouth at bedtime as needed for sleep.    Dispense:  30 tablet    Refill:  3  . hydrOXYzine (ATARAX/VISTARIL) 25 MG tablet    Sig: Take 0.5-1 tablets (12.5-25 mg total) by mouth 3 (three) times daily as needed.    Dispense:  30 tablet    Refill:  0  . clonazePAM (KLONOPIN) 0.5 MG tablet    Sig: Take 1 tablet (0.5 mg total) by mouth as directed. ONLY FOR SEVERE PANIC ATTACKS 1-2 times a months only.    Dispense:  10 tablet    Refill:  0      Follow up plan: Return in about 4 weeks (around 06/22/2020) for Anxiety follow up.    Marie , FNP Family Nurse Practitioner [facility] 05/25/2020, 1:45 PM  

## 2020-05-25 NOTE — Assessment & Plan Note (Signed)
Reviewed sleep hygiene and nocturnal anxiety/panic attacks.  Discussed option of using trazodone 25-50mg  PRN for sleep to help with insomnia and nocturnal anxiety.  Patient interested in starting. Reviewed sleep hygiene handout.  Plan: 1. Can take trazodone 25-50mg  QHS PRN for insomnia 2. Review sleep hygiene handout 3. RTC in 4 weeks, sooner, if needed

## 2020-05-25 NOTE — Patient Instructions (Addendum)
There are options to return to clinic with [place], Dr. Shea Evans, or to establish with [facility] [place], [place] [address]  [facility] [address] [place], [place] [address] [phone]  Dominic Barnes [phone]  I have sent in a prescription for trazodone 28m to take 1/2 to 1 tablet at bedtime to help with anxiety and insomnia.  I have sent in a prescription for hydroxyzine 267mtablet to take 1/2 to 1 tablet up to 3x per day as needed for anxiety.  Be sure to take your first dose when you are home to see how this medication will affect you.  As we discussed, I have sent in a one time prescription for clonazepam 0.54m64mo take 1-2x per month for nocturnal panic attacks.  It is important that you get established with a local psychiatry provider.  The following recommendations are helpful adjuncts for helping rebalance your mood.  Eat a nourishing diet. Ensure adequate intake of calories, protein, carbs, fat, vitamins, and minerals. Prioritize whole foods at each meal, including meats, vegetables, fruits, nuts and seeds, etc.   Avoid inflammatory and/or "junk" foods, such as sugar, omega-6 fats, refined grains, chemicals, and preservatives are common in packaged and prepared foods. Minimize or completely avoid these ingredients and stick to whole foods with little to no additives. Cook from scratch as much as possible for more control over what you eat  Get enough sleep. Poor sleep is significantly associated with depression and anxiety. Make 7-9 hours of sleep nightly a top priority  Exercise appropriately. Exercise is known to improve brain functioning and boost mood. Aim for 30 minutes of daily physical activity. Avoid "overtraining," which can cause mental disturbances  Assess your light exposure. Not enough natural light during the day and too much artificial light can have a major impact on your mood. Get outside as often as possible  during daylight hours. Minimize light exposure after dark and avoid the use of electronics that give off blue light before bed  Manage your stress.  Use daily stress management techniques such as meditation, yoga, or mindfulness to retrain your brain to respond differently to stress. Try deep breathing to deactivate your "fight or flight" response.  There are many of sources with apps like Headspace, Calm or a variety of YouTube videos (videos from [employer]asGwynne [employer]ingerve guided meditation)  Prioritize your social life. Work on building social support with new friends or improve current relationships. Consider getting a pet that allows for companionship, social interaction, and physical touch. Try volunteering or joining a faith-based community to increase your sense of purpose  4-7-8 breathing technique at bedtime: breathe in to count of 4, hold breath for count of 7, exhale for count of 8; do 3-5 times for letting go of overactive thoughts  Take time to play Unstructured "play" time can help reduce anxiety and depression Options for play include music, games, sports, dance, art, etc.  Try to add daily omega 3 fatty acids, magnesium, B complex, and balanced amino acid supplements to help improve mood and anxiety.  Sleep hygiene is the single most effective treatment for sleep issues, but it is hard work.  Tips for a good night's sleep:  -Keep sleep environment comfortable and conducive to sleep -Keep regular sleep schedule 7 nights a week -Avoiding naps during the day -Avoiding going to bed until drowsy and ready to sleep, not trying to sleep, and not watching the clock -Get out of bed if not asleep within  15-20 minutes and returning only when drowsy -Avoiding caffeine, nicotine, alcohol, and other substances that interfere with sleep before bedtime -Take an hour before your set bedtime and start to wind down: bath/shower, no more TV or phone (the blue light can interfere with sleeping), listen  to soothing music, or meditation -No TV in your bedroom -Exercising regularly, at least 6 hours before sleep. Yoga and Tai Chi can improve sleep quality  There are a lot of books and apps that may help guide you with any of the following:   -Progressive muscle relaxation (involves methodical tension and relaxation of different Muscle groups throughout body)  Guided imagery  -YouTube - Jason Stephenson has free videos on YouTube that can help with meditation and some   Abdominal breathing   Over the counter sleep aid one hour before bed- and gradually wean your use over 2-4 weeks  Some examples are : *Melatonin 5-10 mg *Sleepology (Can find on Amazon) taken according to packaging directions  There are a few online evidence based online programs, unfortunately they are not free.   Developed by a sleep expert who created a drug-free program for insomnia proven more effective than sleeping pills.  www.cbtforinsomnia.com Sleepio is an evidence-based digital sleep improvement program   www.sleepio.com SHUTi is designed to actively help retrain your body and mind for great sleep through six engaging Cognitive Behavioral Therapy for Insomnia strategy and learning sessions  http://www.myshuti.com/  We will plan to see you back in 4 weeks for anxiety follow up visit  You will receive a survey after today's visit either digitally by e-mail or paper by USPS mail. Your experiences and feedback matter to us.  Please respond so we know how we are doing as we provide care for you.  Call us with any questions/concerns/needs.  It is my goal to be available to you for your health concerns.  Thanks for choosing me to be a partner in your healthcare needs!   Marie , FNP-C Family Nurse Practitioner [facility] Phone: [phone]  

## 2020-05-25 NOTE — Assessment & Plan Note (Signed)
Reported chills when temperature below 80 degrees.  No history of anemia, electrolyte disturbance or thyroid issues.  Has not had these labs checked in years.  Will have labs drawn for CBC, CMP, and Thyroid profile.  RTC in 4 weeks.

## 2020-05-27 LAB — CBC WITH DIFFERENTIAL/PLATELET
Absolute Monocytes: 321 cells/uL (ref 200–950)
Basophils Absolute: 51 cells/uL (ref 0–200)
Basophils Relative: 1 %
Eosinophils Absolute: 31 cells/uL (ref 15–500)
Eosinophils Relative: 0.6 %
HCT: 46.2 % (ref 38.5–50.0)
Hemoglobin: 15.8 g/dL (ref 13.2–17.1)
Lymphs Abs: 1408 cells/uL (ref 850–3900)
MCH: 31.3 pg (ref 27.0–33.0)
MCHC: 34.2 g/dL (ref 32.0–36.0)
MCV: 91.5 fL (ref 80.0–100.0)
MPV: 11.6 fL (ref 7.5–12.5)
Monocytes Relative: 6.3 %
Neutro Abs: 3290 cells/uL (ref 1500–7800)
Neutrophils Relative %: 64.5 %
Platelets: 188 10*3/uL (ref 140–400)
RBC: 5.05 10*6/uL (ref 4.20–5.80)
RDW: 12.5 % (ref 11.0–15.0)
Total Lymphocyte: 27.6 %
WBC: 5.1 10*3/uL (ref 3.8–10.8)

## 2020-05-27 LAB — COMPLETE METABOLIC PANEL WITH GFR
AG Ratio: 2 (calc) (ref 1.0–2.5)
ALT: 42 U/L (ref 9–46)
AST: 28 U/L (ref 10–40)
Albumin: 4.8 g/dL (ref 3.6–5.1)
Alkaline phosphatase (APISO): 45 U/L (ref 36–130)
BUN: 12 mg/dL (ref 7–25)
CO2: 28 mmol/L (ref 20–32)
Calcium: 9.5 mg/dL (ref 8.6–10.3)
Chloride: 103 mmol/L (ref 98–110)
Creat: 0.91 mg/dL (ref 0.60–1.35)
GFR, Est African American: 132 mL/min/{1.73_m2} (ref >=60)
GFR, Est Non African American: 114 mL/min/{1.73_m2} (ref >=60)
Globulin: 2.4 g/dL (calc) (ref 1.9–3.7)
Glucose, Bld: 88 mg/dL (ref 65–99)
Potassium: 4.4 mmol/L (ref 3.5–5.3)
Sodium: 140 mmol/L (ref 135–146)
Total Bilirubin: 3.6 mg/dL — ABNORMAL HIGH (ref 0.2–1.2)
Total Protein: 7.2 g/dL (ref 6.1–8.1)

## 2020-05-27 LAB — THYROID PANEL WITH TSH
Free Thyroxine Index: 2.8 (ref 1.4–3.8)
T3 Uptake: 41 % — ABNORMAL HIGH (ref 22–35)
T4, Total: 6.9 ug/dL (ref 4.9–10.5)
TSH: 0.71 mIU/L (ref 0.40–4.50)

## 2020-05-30 ENCOUNTER — Other Ambulatory Visit: Payer: Self-pay | Admitting: Family Medicine

## 2020-05-30 DIAGNOSIS — R17 Unspecified jaundice: Secondary | ICD-10-CM

## 2020-06-07 ENCOUNTER — Ambulatory Visit
Admission: EM | Admit: 2020-06-07 | Discharge: 2020-06-07 | Disposition: A | Discharge status: Home / Self Care | Payer: 59 | Attending: Emergency Medicine | Admitting: Emergency Medicine

## 2020-06-07 ENCOUNTER — Ambulatory Visit: Payer: 59 | Admitting: Family Medicine

## 2020-06-07 DIAGNOSIS — R21 Rash and other nonspecific skin eruption: Secondary | ICD-10-CM

## 2020-06-07 MED ORDER — CLOTRIMAZOLE 1 % EX CREA: TOPICAL_CREAM | CUTANEOUS | 0 refills | Status: DC

## 2020-06-07 NOTE — ED Provider Notes (Signed)
Dominic Barnes    CSN: [id number] Arrival date & time: 06/07/20  0913      History   Chief Complaint Chief Complaint  Patient presents with  . Rash    HPI Dominic Barnes is a 29 y.o. male.   Patient presents with a red rash on his scrotum and shaft of his penis since this morning.  He states he was out working in the rain yesterday and had on wet shorts until late in the evening.  He attempted treatment at home with aloe vera lotion which has improved the area somewhat.  He denies penile discharge, testicular pain, lesions, abdominal pain, dysuria, back pain, or other symptoms.  The history is provided by the patient.    Past Medical History:  Diagnosis Date  . Anxiety   . 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 Asthma    exercise induced. 1 episiode. Approx age 29.  [name] Headache    sinus  . Sinus trouble   . Testicle pain     Patient Active Problem List   Diagnosis Date Noted  . Elevated bilirubin 05/30/2020  . Insomnia 05/25/2020  . Chills 05/25/2020  . Bipolar 2 disorder, major depressive episode (HCC) 04/20/2019  . GAD (generalized anxiety disorder) 04/20/2019  . Panic attack 04/20/2019  . IBS (irritable bowel syndrome) 06/12/2018  . Back pain 06/12/2018  . Grade III hemorrhoids 02/12/2018  . High-tone pelvic floor dysfunction 04/10/2017  . Pelvic pain in male 03/12/2017  . Chronic prostatitis 03/09/2017  . Depression with anxiety 08/05/2016  . Pain with ejaculation 11/28/2015  . Anxiety 11/28/2015  . Sinus disorder 11/08/2015  . Dysesthesia 11/08/2015    Past Surgical History:  Procedure Laterality Date  . ELBOW SURGERY     right  . IMAGE GUIDED SINUS SURGERY N/A 08/30/2015   Procedure: IMAGE GUIDED SINUS SURGERY;  Surgeon: Bud Face, MD;  Location: [facility];  Service: ENT;  Laterality: N/A;  GAVE DISK TO [name] 11/3  . MAXILLARY ANTROSTOMY Bilateral 08/30/2015   Procedure: MAXILLARY ANTROSTOMY;  Surgeon: Bud Face, MD;   Location: [facility];  Service: ENT;  Laterality: Bilateral;  . NASAL SINUS SURGERY    . SEPTOPLASTY N/A 08/30/2015   Procedure: SEPTOPLASTY;  Surgeon: Bud Face, MD;  Location: [facility];  Service: ENT;  Laterality: N/A;  . TONSILLECTOMY    . TURBINATE REDUCTION Bilateral 08/30/2015   Procedure: INFERIOR TURBINATE REDUCTION ;  Surgeon: Bud Face, MD;  Location: [facility];  Service: ENT;  Laterality: Bilateral;  . WISDOM TOOTH EXTRACTION         Home Medications    Prior to Admission medications   Medication Sig Start Date End Date Taking? Authorizing Provider  alfuzosin (UROXATRAL) 10 MG 24 hr tablet Take 10 mg by mouth. Patient not taking: Reported on 05/25/2020 01/24/17   [provider]  clonazePAM (KLONOPIN) 0.5 MG tablet Take 1 tablet (0.5 mg total) by mouth as directed. ONLY FOR SEVERE PANIC ATTACKS 1-2 times a months only. 05/25/20   Malfi, Jodelle Gross, FNP  clotrimazole (LOTRIMIN) 1 % cream Apply to affected area 2 times daily 06/07/20   Mickie Bail, NP  hydrOXYzine (ATARAX/VISTARIL) 25 MG tablet Take 0.5-1 tablets (12.5-25 mg total) by mouth 3 (three) times daily as needed. 05/25/20   Malfi, Jodelle Gross, FNP  lamoTRIgine (LAMICTAL) 100 MG tablet Take 1 tablet (100 mg total) by mouth daily. To be combined with 25 mg Patient not taking:  Reported on 05/25/2020 06/02/19   Jomarie Longs, MD  lamoTRIgine (LAMICTAL) 25 MG tablet Take 2 tablets (50 mg total) by mouth daily. To be combined with 100 mg Patient not taking: Reported on 05/25/2020 06/02/19   Jomarie Longs, MD  Multiple Vitamin (MULTIVITAMIN) tablet Take 1 tablet by mouth daily.    [provider]  risperiDONE (RISPERDAL) 0.5 MG tablet Take 1 tablet (0.5 mg total) by mouth at bedtime. To be combined with 2 mg Patient not taking: Reported on 05/25/2020 08/12/19   Jomarie Longs, MD  risperiDONE (RISPERDAL) 2 MG tablet Take 1 tablet (2 mg total) by mouth at bedtime. Patient not  taking: Reported on 05/25/2020 03/19/19   Jomarie Longs, MD  traZODone (DESYREL) 50 MG tablet Take 0.5-1 tablets (25-50 mg total) by mouth at bedtime as needed for sleep. 05/25/20   Malfi, Jodelle Gross, FNP    Family History Family History  Problem Relation Age of Onset  . Cirrhosis Father   . Anxiety disorder Father   . Depression Father   . Alcohol abuse Father   . ADD / ADHD Brother   . Kidney disease Neg Hx   . Prostate cancer Neg Hx     Social History Social History   Tobacco Use  . Smoking status: Former Smoker    Quit date: 07/21/2012    Years since quitting: 7.8  . Smokeless tobacco: Former Neurosurgeon  . Tobacco comment: quit 5 years /occasional dip  Vaping Use  . Vaping Use: Never used  Substance Use Topics  . Alcohol use: Yes    Alcohol/week: 12.0 standard drinks    Types: 12 Cans of beer per week    Comment: socially  . Drug use: Yes    Types: Marijuana    Comment: last used 2 weeks      Allergies   Patient has no known allergies.   Review of Systems Review of Systems  Constitutional: Negative for chills and fever.  HENT: Negative for ear pain and sore throat.   Eyes: Negative for pain and visual disturbance.  Respiratory: Negative for cough and shortness of breath.   Cardiovascular: Negative for chest pain and palpitations.  Gastrointestinal: Negative for abdominal pain and vomiting.  Genitourinary: Negative for discharge, dysuria, flank pain, hematuria and testicular pain.  Musculoskeletal: Negative for arthralgias and back pain.  Skin: Positive for rash. Negative for color change.  Neurological: Negative for seizures and syncope.  All other systems reviewed and are negative.    Physical Exam Triage Vital Signs ED Triage Vitals  Enc Vitals Group     BP 06/07/20 0915 135/86     Pulse Rate 06/07/20 0915 69     Resp 06/07/20 0915 12     Temp 06/07/20 0915 98.6 F (37 C)     Temp src --      SpO2 06/07/20 0915 98 %     Weight --      Height --       Head Circumference --      Peak Flow --      Pain Score 06/07/20 0914 4     Pain Loc --      Pain Edu? --      Excl. in GC? --    No data found.  Updated Vital Signs BP 135/86   Pulse 69   Temp 98.6 F (37 C)   Resp 12   SpO2 98%   Visual Acuity Right Eye Distance:   Left Eye Distance:  Bilateral Distance:    Right Eye Near:   Left Eye Near:    Bilateral Near:     Physical Exam Vitals and nursing note reviewed.  Constitutional:      Appearance: He is well-developed.  HENT:     Head: Normocephalic and atraumatic.     Mouth/Throat:     Mouth: Mucous membranes are moist.  Eyes:     Conjunctiva/sclera: Conjunctivae normal.  Cardiovascular:     Rate and Rhythm: Normal rate and regular rhythm.     Heart sounds: No murmur heard.   Pulmonary:     Effort: Pulmonary effort is normal. No respiratory distress.     Breath sounds: Normal breath sounds.  Abdominal:     Palpations: Abdomen is soft.     Tenderness: There is no abdominal tenderness. There is no guarding or rebound.  Genitourinary:    Testes: Normal.     Comments: Scrotum and shaft of penis mildly erythematous.  No penile discharge.  No lesions.  Musculoskeletal:     Cervical back: Neck supple.  Skin:    General: Skin is warm and dry.  Neurological:     General: No focal deficit present.     Mental Status: He is alert and oriented to person, place, and time.     Gait: Gait normal.  Psychiatric:        Mood and Affect: Mood normal.        Behavior: Behavior normal.      UC Treatments / Results  Labs (all labs ordered are listed, but only abnormal results are displayed) Labs Reviewed - No data to display  EKG   Radiology No results found.  Procedures Procedures (including critical care time)  Medications Ordered in UC Medications - No data to display  Initial Impression / Assessment and Plan / UC Course  I have reviewed the triage vital signs and the nursing notes.  Pertinent labs &  imaging results that were available during my care of the patient were reviewed by me and considered in my medical decision making (see chart for details).   Rash.  Treating with clotrimazole cream.  Instructed patient to follow-up with his PCP if his symptoms or not improving.  Patient agrees to plan of care.     Final Clinical Impressions(s) / UC Diagnoses   Final diagnoses:  Rash     Discharge Instructions     Use the clotrimazole cream as directed.    Follow up with your primary care provider if your symptoms are not improving.       ED Prescriptions    Medication Sig Dispense Auth. Provider   clotrimazole (LOTRIMIN) 1 % cream Apply to affected area 2 times daily 15 g Mickie Bail, NP     PDMP not reviewed this encounter.   Mickie Bail, NP 06/07/20 [phone]

## 2020-06-07 NOTE — Discharge Instructions (Addendum)
Use the clotrimazole cream as directed.  Follow up with your primary care provider if your symptoms are not improving.     

## 2020-06-07 NOTE — ED Triage Notes (Signed)
Patient reports an inflammed and painful rash woke him up this morning around 3 am. Reports it has since died down in severity

## 2020-06-22 ENCOUNTER — Telehealth: Payer: Self-pay | Admitting: Family Medicine

## 2020-06-22 ENCOUNTER — Encounter: Payer: Self-pay | Admitting: Family Medicine

## 2020-06-22 ENCOUNTER — Ambulatory Visit: Payer: 59 | Admitting: Family Medicine

## 2020-06-22 ENCOUNTER — Other Ambulatory Visit: Payer: Self-pay

## 2020-06-22 ENCOUNTER — Ambulatory Visit: Payer: Self-pay | Admitting: Family Medicine

## 2020-06-22 VITALS — BP 140/70 | HR 53 | Temp 98.2°F | Resp 18 | Ht 72.0 in | Wt 165.0 lb

## 2020-06-22 DIAGNOSIS — R17 Unspecified jaundice: Secondary | ICD-10-CM | POA: Diagnosis not present

## 2020-06-22 DIAGNOSIS — Z23 Encounter for immunization: Secondary | ICD-10-CM | POA: Diagnosis not present

## 2020-06-22 DIAGNOSIS — F3181 Bipolar II disorder: Secondary | ICD-10-CM | POA: Diagnosis not present

## 2020-06-22 DIAGNOSIS — F419 Anxiety disorder, unspecified: Secondary | ICD-10-CM

## 2020-06-22 DIAGNOSIS — G4700 Insomnia, unspecified: Secondary | ICD-10-CM

## 2020-06-22 DIAGNOSIS — F410 Panic disorder [episodic paroxysmal anxiety] without agoraphobia: Secondary | ICD-10-CM

## 2020-06-22 MED ORDER — VENLAFAXINE HCL ER 37.5 MG PO CP24: 37.5000 mg | ORAL_CAPSULE | Freq: Every day | ORAL | 0 refills | Status: DC

## 2020-06-22 MED ORDER — CLONAZEPAM 0.5 MG PO TABS: 0.5000 mg | ORAL_TABLET | ORAL | 0 refills | Status: DC

## 2020-06-22 MED ORDER — BUSPIRONE HCL 15 MG PO TABS: 15.0000 mg | ORAL_TABLET | Freq: Three times a day (TID) | ORAL | 1 refills | Status: DC

## 2020-06-22 MED ORDER — RISPERIDONE 2 MG PO TABS: 2.0000 mg | ORAL_TABLET | Freq: Every day | ORAL | 1 refills | Status: DC

## 2020-06-22 MED ORDER — VENLAFAXINE HCL ER 75 MG PO CP24: 75.0000 mg | ORAL_CAPSULE | Freq: Every day | ORAL | 1 refills | Status: DC

## 2020-06-22 NOTE — Assessment & Plan Note (Signed)
See anxiety A/P. 

## 2020-06-22 NOTE — Assessment & Plan Note (Signed)
Encouraged repeat CMP for re-evaluation.  Patient agreeable, reports will have labs drawn next week.  Plan: 1. Repeat CMP

## 2020-06-22 NOTE — Patient Instructions (Signed)
I have sent in refills on your prescriptions to your pharmacy on file.  Have your labs drawn and we will contact you with the results.  Once we see you back in 3 months and determine the treatment plan that is effective for you, we will discuss signing a controlled substances contract.  The following recommendations are helpful adjuncts for helping rebalance your mood.  Eat a nourishing diet. Ensure adequate intake of calories, protein, carbs, fat, vitamins, and minerals. Prioritize whole foods at each meal, including meats, vegetables, fruits, nuts and seeds, etc.   Avoid inflammatory and/or "junk" foods, such as sugar, omega-6 fats, refined grains, chemicals, and preservatives are common in packaged and prepared foods. Minimize or completely avoid these ingredients and stick to whole foods with little to no additives. Cook from scratch as much as possible for more control over what you eat  Get enough sleep. Poor sleep is significantly associated with depression and anxiety. Make 7-9 hours of sleep nightly a top priority  Exercise appropriately. Exercise is known to improve brain functioning and boost mood. Aim for 30 minutes of daily physical activity. Avoid "overtraining," which can cause mental disturbances  Assess your light exposure. Not enough natural light during the day and too much artificial light can have a major impact on your mood. Get outside as often as possible during daylight hours. Minimize light exposure after dark and avoid the use of electronics that give off blue light before bed  Manage your stress.  Use daily stress management techniques such as meditation, yoga, or mindfulness to retrain your brain to respond differently to stress. Try deep breathing to deactivate your "fight or flight" response.  There are many of sources with apps like Headspace, Calm or a variety of YouTube videos (videos from Romero Belling have guided meditation)  Prioritize your social life.  Work on building social support with new friends or improve current relationships. Consider getting a pet that allows for companionship, social interaction, and physical touch. Try volunteering or joining a faith-based community to increase your sense of purpose  4-7-8 breathing technique at bedtime: breathe in to count of 4, hold breath for count of 7, exhale for count of 8; do 3-5 times for letting go of overactive thoughts  Take time to play Unstructured "play" time can help reduce anxiety and depression Options for play include music, games, sports, dance, art, etc.  Try to add daily omega 3 fatty acids, magnesium, B complex, and balanced amino acid supplements to help improve mood and anxiety.  We will plan to see you back in 3 months for anxiety follow up visit  You will receive a survey after today's visit either digitally by e-mail or paper by USPS mail. Your experiences and feedback matter to [place].  Please respond so we know how we are doing as we provide care for you.  Call us with any questions/concerns/needs.  It is my goal to be available to you for your health concerns.  Thanks for choosing me to be a partner in your healthcare needs!  Charlaine Dalton, FNP-C Family Nurse Practitioner [facility] Phone: [phone]

## 2020-06-22 NOTE — Telephone Encounter (Signed)
error:[id number] ° °

## 2020-06-22 NOTE — Telephone Encounter (Signed)
Three attempts to reach pt.throughtout day. VM not set up.  Pt questioning if appropriate to take risperdal and effexor-XR together.  Please advise.

## 2020-06-22 NOTE — Progress Notes (Signed)
Subjective:    Patient ID: Dominic Barnes, male    DOB: [birth year], 29 y.o.   MRN: [id number]  Dominic Barnes is a 30 y.o. male presenting on 06/22/2020 for Anxiety   HPI  Dominic Barnes presents to clinic for follow up on his anxiety and insomnia.  Reports Dominic Barnes has had an improvement in his insomnia since beginning the trazodone.  Reports that Dominic Barnes is sleeping better since beginning trazodone which Dominic Barnes has found helpful with reducing his anxiety.  Reports improvement on his anxiety with PRN clonazepam since our last meeting 1 month ago, had been taking the hydroxyzine 25mg  up to 3x per day without changes in his symptoms.  Depression screen [facility] 2/9 06/22/2020 05/25/2020 12/16/2016  Decreased Interest 1 2 2   Down, Depressed, Hopeless 3 3 2   PHQ - 2 Score 4 5 4   Altered sleeping 0 3 1  Tired, decreased energy 1 2 1   Change in appetite 0 1 2  Feeling bad or failure about yourself  3 3 1   Trouble concentrating 2 3 0  Moving slowly or fidgety/restless 2 1 2   Suicidal thoughts 1 2 0  PHQ-9 Score 13 20 11   Difficult doing work/chores - Very difficult Somewhat difficult    Social History   Tobacco Use  . Smoking status: Former Smoker    Quit date: 07/21/2012    Years since quitting: 7.9  . Smokeless tobacco: Former  . Tobacco comment: quit 5 years /occasional dip  Vaping Use  . Vaping Use: Never used  Substance Use Topics  . Alcohol use: Yes    Alcohol/week: 7.0 standard drinks    Types: 7 Cans of beer per week    Comment: socially  . Drug use: Yes    Types: Marijuana    Comment: last used 2 weeks     Review of Systems  Constitutional: Negative.   HENT: Negative.   Eyes: Negative.   Respiratory: Negative.   Cardiovascular: Negative.   Gastrointestinal: Negative.   Endocrine: Negative.   Genitourinary: Negative.   Musculoskeletal: Negative.   Skin: Negative.   Allergic/Immunologic: Negative.   Neurological: Negative.   Hematological: Negative.   Psychiatric/Behavioral:  Negative for agitation, behavioral problems, confusion, decreased concentration, dysphoric mood, hallucinations, self-injury, sleep disturbance and suicidal ideas. The patient is nervous/anxious. The patient is not hyperactive.    Per HPI unless specifically indicated above     Objective:    BP 140/70 (BP Location: Right Arm, Patient Position: Sitting, Cuff Size: Normal)   Pulse (!) 53   Temp 98.2 F (36.8 C) (Oral)   Resp 18   Ht 6' (1.829 m)   Wt 165 lb (74.8 kg)   SpO2 100%   BMI 22.38 kg/m   Wt Readings from Last 3 Encounters:  06/22/20 165 lb (74.8 kg)  05/25/20 161 lb 9.6 oz (73.3 kg)  08/12/18 160 lb 6.4 oz (72.8 kg)    Physical Exam Vitals reviewed.  Constitutional:      General: Dominic Barnes is not in acute distress.    Appearance: Normal appearance. Dominic Barnes is well-developed, well-groomed and normal weight. Dominic Barnes is not ill-appearing or toxic-appearing.  HENT:     Head: Normocephalic and atraumatic.     Nose:     Comments: is in place, covering mouth and nose. Eyes:     General: Lids are normal. Vision grossly intact.        Right eye: No discharge.        Left eye:  No discharge.     Extraocular Movements: Extraocular movements intact.     Conjunctiva/sclera: Conjunctivae normal.     Pupils: Pupils are equal, round, and reactive to light.  Cardiovascular:     Rate and Rhythm: Normal rate and regular rhythm.     Pulses: Normal pulses.     Heart sounds: Normal heart sounds. No murmur heard.  No friction rub. No gallop.   Pulmonary:     Effort: Pulmonary effort is normal. No respiratory distress.     Breath sounds: Normal breath sounds.  Musculoskeletal:     Right lower leg: No edema.     Left lower leg: No edema.  Skin:    General: Skin is warm and dry.     Capillary Refill: Capillary refill takes less than 2 seconds.  Neurological:     General: No focal deficit present.     Mental Status: Dominic Barnes is alert and oriented to person, place, and time.  Psychiatric:         Attention and Perception: Attention and perception normal.        Mood and Affect: Mood and affect normal.        Speech: Speech normal.        Behavior: Behavior normal. Behavior is cooperative.        Thought Content: Thought content normal.        Cognition and Memory: Cognition and memory normal.        Judgment: Judgment normal.    Results for orders placed or performed in visit on 05/25/20  CBC with Differential  Result Value Ref Range   WBC 5.1 3.8 - 10.8 Thousand/uL   RBC 5.05 4.20 - 5.80 Million/uL   Hemoglobin 15.8 13.2 - 17.1 g/dL   HCT 16.146.2 38 - 50 %   MCV 91.5 80.0 - 100.0 fL   MCH 31.3 27.0 - 33.0 pg   MCHC 34.2 32.0 - 36.0 g/dL   RDW 09.612.5 04.511.0 - 40.915.0 %   Platelets 188 140 - 400 Thousand/uL   MPV 11.6 7.5 - 12.5 fL   Neutro Abs 3,290 1,500 - 7,800 cells/uL   Lymphs Abs 1,408 850 - 3,900 cells/uL   Absolute Monocytes 321 200 - 950 cells/uL   Eosinophils Absolute 31 15 - 500 cells/uL   Basophils Absolute 51 0 - 200 cells/uL   Neutrophils Relative % 64.5 %   Total Lymphocyte 27.6 %   Monocytes Relative 6.3 %   Eosinophils Relative 0.6 %   Basophils Relative 1.0 %  COMPLETE METABOLIC PANEL WITH GFR  Result Value Ref Range   Glucose, Bld 88 65 - 99 mg/dL   BUN 12 7 - 25 mg/dL   Creat 8.110.91 9.140.60 - 7.821.35 mg/dL   GFR, Est Non African American 114 > OR = 60 mL/min/1.273m2   GFR, Est African American 132 > OR = 60 mL/min/1.7573m2   BUN/Creatinine Ratio NOT APPLICABLE 6 - 22 (calc)   Sodium 140 135 - 146 mmol/L   Potassium 4.4 3.5 - 5.3 mmol/L   Chloride 103 98 - 110 mmol/L   CO2 28 20 - 32 mmol/L   Calcium 9.5 8.6 - 10.3 mg/dL   Total Protein 7.2 6.1 - 8.1 g/dL   Albumin 4.8 3.6 - 5.1 g/dL   Globulin 2.4 1.9 - 3.7 g/dL (calc)   AG Ratio 2.0 1.0 - 2.5 (calc)   Total Bilirubin 3.6 (H) 0.2 - 1.2 mg/dL   Alkaline phosphatase (APISO) 45 36 - 130 U/L  AST 28 10 - 40 U/L   ALT 42 9 - 46 U/L  Thyroid Panel With TSH  Result Value Ref Range   T3 Uptake 41 (H) 22 - 35 %    T4, Total 6.9 4.9 - 10.5 mcg/dL   Free Thyroxine Index 2.8 1.4 - 3.8   TSH 0.71 0.40 - 4.50 mIU/L      Assessment & Plan:   Problem List Items Addressed This Visit      Other   Anxiety - Primary    PHQ9-13/GAD7-18.  Improved from visit 4 weeks ago, PHQ9-20/GAD7-17.  Has been taking hydroxyzine 25mg  3x per day without relief of his anxiety.  Did take the clonazepam a few times this past month with help reducing his symptoms.  Has had improved sleep with the trazodone which believes has helped with his anxiety as well.  Has been on venlafaxine in the past with control over his anxiety and is interested in restarting on this prescription.  Has taken risperidone 2mg  at bedtime which reports tolerated well and would like to restart on this.  Has referral to psychiatry but is interested in doing as much in primary care due to cost of co-pay to see a specialist.  Plan: 1. Begin venlafaxine 37.5mg  daily x 7 days then start 75mg  daily. 2. STOP hydroxyzine 25mg  TID and begin Buspar 15mg  TID for anxiety 3. Restart on risperidone 2mg  daily at bedtime 4. Continue clonazepam 0.5mg  1-2x per month for severe panic attacks 5. Mood handout provided 6. RTC in 3 months      Relevant Medications   busPIRone (BUSPAR) 15 MG tablet   venlafaxine XR (EFFEXOR-XR) 37.5 MG 24 hr capsule   venlafaxine XR (EFFEXOR-XR) 75 MG 24 hr capsule   Bipolar 2 disorder, major depressive episode (HCC)    See anxiety A/P      Relevant Medications   risperiDONE (RISPERDAL) 2 MG tablet   venlafaxine XR (EFFEXOR-XR) 37.5 MG 24 hr capsule   venlafaxine XR (EFFEXOR-XR) 75 MG 24 hr capsule   Panic attack    See anxiety A/P      Relevant Medications   busPIRone (BUSPAR) 15 MG tablet   venlafaxine XR (EFFEXOR-XR) 37.5 MG 24 hr capsule   venlafaxine XR (EFFEXOR-XR) 75 MG 24 hr capsule   clonazePAM (KLONOPIN) 0.5 MG tablet   Insomnia    See anxiety A/P      Elevated bilirubin    Encouraged repeat CMP for re-evaluation.   Patient agreeable, reports will have labs drawn next week.  Plan: 1. Repeat CMP      Relevant Orders   COMPLETE METABOLIC PANEL WITH GFR    Other Visit Diagnoses    Needs flu shot       Relevant Orders   Flu Vaccine QUAD 6+ mos PF IM (Fluarix Quad PF) (Completed)      Meds ordered this encounter  Medications  . busPIRone (BUSPAR) 15 MG tablet    Sig: Take 1 tablet (15 mg total) by mouth 3 (three) times daily.    Dispense:  90 tablet    Refill:  1  . risperiDONE (RISPERDAL) 2 MG tablet    Sig: Take 1 tablet (2 mg total) by mouth at bedtime.    Dispense:  90 tablet    Refill:  1  . venlafaxine XR (EFFEXOR-XR) 37.5 MG 24 hr capsule    Sig: Take 1 capsule (37.5 mg total) by mouth daily with breakfast.    Dispense:  7 capsule  Refill:  0  . venlafaxine XR (EFFEXOR-XR) 75 MG 24 hr capsule    Sig: Take 1 capsule (75 mg total) by mouth daily with breakfast.    Dispense:  90 capsule    Refill:  1  . clonazePAM (KLONOPIN) 0.5 MG tablet    Sig: Take 1 tablet (0.5 mg total) by mouth as directed. ONLY FOR SEVERE PANIC ATTACKS 1-2 times a months only.    Dispense:  10 tablet    Refill:  0    Follow up plan: Return in about 3 months (around 09/21/2020) for Anxiety follow up visit.   Charlaine Dalton, FNP Family Nurse Practitioner [facility] 06/22/2020, 12:40 PM

## 2020-06-22 NOTE — Assessment & Plan Note (Signed)
PHQ9-13/GAD7-18.  Improved from visit 4 weeks ago, PHQ9-20/GAD7-17.  Has been taking hydroxyzine 25mg  3x per day without relief of his anxiety.  Did take the clonazepam a few times this past month with help reducing his symptoms.  Has had improved sleep with the trazodone which believes has helped with his anxiety as well.  Has been on venlafaxine in the past with control over his anxiety and is interested in restarting on this prescription.  Has taken risperidone 2mg  at bedtime which reports tolerated well and would like to restart on this.  Has referral to psychiatry but is interested in doing as much in primary care due to cost of co-pay to see a specialist.  Plan: 1. Begin venlafaxine 37.5mg  daily x 7 days then start 75mg  daily. 2. STOP hydroxyzine 25mg  TID and begin Buspar 15mg  TID for anxiety 3. Restart on risperidone 2mg  daily at bedtime 4. Continue clonazepam 0.5mg  1-2x per month for severe panic attacks 5. Mood handout provided 6. RTC in 3 months

## 2020-06-23 NOTE — Telephone Encounter (Signed)
I just informed the patient of Joni Reining recommendation  Plan: 1. Begin venlafaxine 37.5mg  daily x 7 days then start 75mg  daily. 2. STOP hydroxyzine 25mg  TID and begin Buspar 15mg  TID for anxiety 3. Restart on risperidone 2mg  daily at bedtime 4. Continue clonazepam 0.5mg  1-2x per month for severe panic attacks 5. Mood handout provided 6. RTC in 3 months  He verified understanding, no question or concern.

## 2020-08-17 ENCOUNTER — Other Ambulatory Visit: Payer: Self-pay | Admitting: Family Medicine

## 2020-08-17 DIAGNOSIS — F419 Anxiety disorder, unspecified: Secondary | ICD-10-CM

## 2020-08-17 NOTE — Telephone Encounter (Signed)
Requested Prescriptions  Pending Prescriptions Disp Refills  . busPIRone (BUSPAR) 15 MG tablet [Pharmacy Med Name: BUSPIRONE HCL 15 MG TAB] 270 tablet 1    Sig: TAKE 1 TABLET BY MOUTH 3 TIMES DAILY     Psychiatry: Anxiolytics/Hypnotics - Non-controlled Passed - 08/17/2020 11:02 AM      Passed - Valid encounter within last 6 months    Recent Outpatient Visits          1 month ago Anxiety   [facility], Jodelle Gross, FNP   2 months ago GAD (generalized anxiety disorder)   [facility], Jodelle Gross, FNP   2 years ago Needs flu shot   [facility] Rudd, Alison Stalling, NP   2 years ago Lower abdominal pain   [facility] Kyung Rudd, Alison Stalling, NP   2 years ago Inflamed external hemorrhoid   [facility] Kyung Rudd, Alison Stalling, NP

## 2020-08-21 ENCOUNTER — Other Ambulatory Visit: Payer: Self-pay | Admitting: Family Medicine

## 2020-08-21 DIAGNOSIS — F410 Panic disorder [episodic paroxysmal anxiety] without agoraphobia: Secondary | ICD-10-CM

## 2020-08-21 NOTE — Telephone Encounter (Signed)
Requested medication (s) are due for refill today: no  Requested medication (s) are on the active medication list: yes  Last refill:  06/22/20  Future visit scheduled: no  Notes to clinic:  med not delegated to NT    Requested Prescriptions  Pending Prescriptions Disp Refills   clonazePAM (KLONOPIN) 0.5 MG tablet [Pharmacy Med Name: CLONAZEPAM 0.5 MG TAB] 10 tablet     Sig: TAKE 1 TABLET BY MOUTH AS DIRECTED FOR SEVERE PAINIC ATTACKS ONLY MAX 1-2 TIMES A MONTH      Not Delegated - Psychiatry:  Anxiolytics/Hypnotics Failed - 08/21/2020  3:47 PM      Failed - This refill cannot be delegated      Failed - Urine Drug Screen completed in last 360 days      Passed - Valid encounter within last 6 months    Recent Outpatient Visits           2 months ago Anxiety   [facility], Jodelle Gross, FNP   2 months ago GAD (generalized anxiety disorder)   [facility], Jodelle Gross, FNP   2 years ago Needs flu shot   [facility] Rudd, Alison Stalling, NP   2 years ago Lower abdominal pain   [facility] Kyung Rudd, Alison Stalling, NP   2 years ago Inflamed external hemorrhoid   [facility] Kyung Rudd, Alison Stalling, NP

## 2020-08-25 ENCOUNTER — Other Ambulatory Visit: Payer: Self-pay | Admitting: Family Medicine

## 2020-08-25 DIAGNOSIS — F410 Panic disorder [episodic paroxysmal anxiety] without agoraphobia: Secondary | ICD-10-CM

## 2020-08-25 NOTE — Telephone Encounter (Signed)
Medication Refill - Medication: clonazePAM (KLONOPIN) 0.5 MG tablet    Preferred Pharmacy (with phone number or street name):  [facility] - [place], [place] - [address] Phone:  [phone]  Fax:  [phone]       Agent: Please be advised that RX refills may take up to 3 business days. We ask that you follow-up with your pharmacy.

## 2020-08-25 NOTE — Telephone Encounter (Signed)
Requested medication (s) are due for refill today:  Yes  Requested medication (s) are on the active medication list:  Yes  Future visit scheduled:  No  Last Refill: 06/22/20; #10; no refills  Requested Prescriptions  Pending Prescriptions Disp Refills   clonazePAM (KLONOPIN) 0.5 MG tablet 10 tablet 0    Sig: Take 1 tablet (0.5 mg total) by mouth as directed. ONLY FOR SEVERE PANIC ATTACKS 1-2 times a months only.      Not Delegated - Psychiatry:  Anxiolytics/Hypnotics Failed - 08/25/2020  3:18 PM      Failed - This refill cannot be delegated      Failed - Urine Drug Screen completed in last 360 days      Passed - Valid encounter within last 6 months    Recent Outpatient Visits           2 months ago Anxiety   [facility], Jodelle Gross, FNP   3 months ago GAD (generalized anxiety disorder)   [facility], Jodelle Gross, FNP   2 years ago Needs flu shot   [facility], Alison Stalling, NP   2 years ago Lower abdominal pain   [facility] Kyung Rudd, Alison Stalling, NP   2 years ago Inflamed external hemorrhoid   [facility] Galen Manila, NP

## 2020-09-22 ENCOUNTER — Ambulatory Visit: Payer: 59 | Admitting: Family Medicine

## 2020-09-28 ENCOUNTER — Ambulatory Visit: Payer: 59 | Admitting: Family Medicine

## 2020-10-24 ENCOUNTER — Ambulatory Visit: Payer: 59 | Admitting: Family Medicine

## 2020-11-02 ENCOUNTER — Telehealth (INDEPENDENT_AMBULATORY_CARE_PROVIDER_SITE_OTHER): Payer: 59 | Admitting: Family Medicine

## 2020-11-02 ENCOUNTER — Encounter: Payer: Self-pay | Admitting: Family Medicine

## 2020-11-02 ENCOUNTER — Other Ambulatory Visit: Payer: Self-pay

## 2020-11-02 DIAGNOSIS — F410 Panic disorder [episodic paroxysmal anxiety] without agoraphobia: Secondary | ICD-10-CM | POA: Diagnosis not present

## 2020-11-02 DIAGNOSIS — F3181 Bipolar II disorder: Secondary | ICD-10-CM | POA: Diagnosis not present

## 2020-11-02 MED ORDER — RISPERIDONE 2 MG PO TABS: 2.0000 mg | ORAL_TABLET | Freq: Every day | ORAL | 1 refills | Status: DC

## 2020-11-02 MED ORDER — CLONAZEPAM 0.5 MG PO TABS: 0.5000 mg | ORAL_TABLET | ORAL | 2 refills | Status: DC

## 2020-11-02 NOTE — Progress Notes (Signed)
Virtual Visit via Telephone  The purpose of this virtual visit is to provide medical care while limiting exposure to the novel coronavirus (COVID19) for both patient and office staff.  Consent was obtained for phone visit:  Yes.   Answered questions that patient had about telehealth interaction:  Yes.   I discussed the limitations, risks, security and privacy concerns of performing an evaluation and management service by telephone. I also discussed with the patient that there may be a patient responsible charge related to this service. The patient expressed understanding and agreed to proceed.  Patient is at home and is accessed via telephone Services are provided by Charlaine Dalton, FNP-C from [facility])  ---------------------------------------------------------------------- Chief Complaint  Patient presents with  . Anxiety    S: Reviewed CMA documentation. I have called patient and gathered additional HPI as follows:  Mr. Grisby presents for virtual telemedicine visit via telephone for a follow up on his anxiety.  Reports he has titrated off of venlafaxine and buspar due to exacerbation of his erectile dysfunction, has found improvement in his anxiety with his risperidone and PRN clonazepam.  Is requesting refills on his risperidone and clonazepam.  Denies SI/HI.   Patient is currently home Denies any high risk travel to areas of current concern for COVID19. Denies any known or suspected exposure to person with or possibly with COVID19.  Past Medical History:  Diagnosis Date  . Anxiety   . 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 Asthma    exercise induced. 1 episiode. Approx age 65.  [name] Headache    sinus  . Sinus trouble   . Testicle pain    Social History   Tobacco Use  . Smoking status: Former Smoker    Quit date: 07/21/2012    Years since quitting: 8.2  . Smokeless tobacco: Former Neurosurgeon  . Tobacco comment: quit 5 years /occasional dip   Vaping Use  . Vaping Use: Never used  Substance Use Topics  . Alcohol use: Yes    Alcohol/week: 7.0 standard drinks    Types: 7 Cans of beer per week    Comment: socially  . Drug use: Yes    Types: Marijuana    Comment: last used 2 weeks     Current Outpatient Medications:  [name]  Multiple Vitamin (MULTIVITAMIN) tablet, Take 1 tablet by mouth daily., Disp: , Rfl:  .  traZODone (DESYREL) 50 MG tablet, Take 0.5-1 tablets (25-50 mg total) by mouth at bedtime as needed for sleep., Disp: 30 tablet, Rfl: 3 .  alfuzosin (UROXATRAL) 10 MG 24 hr tablet, Take 10 mg by mouth. (Patient not taking: No sig reported), Disp: , Rfl:  .  clonazePAM (KLONOPIN) 0.5 MG tablet, Take 1 tablet (0.5 mg total) by mouth as directed. ONLY FOR SEVERE PANIC ATTACKS 1-2 times a months only., Disp: 10 tablet, Rfl: 2 .  risperiDONE (RISPERDAL) 2 MG tablet, Take 1 tablet (2 mg total) by mouth at bedtime., Disp: 90 tablet, Rfl: 1  Depression screen [facility] 2/9 11/02/2020 06/22/2020 05/25/2020  Decreased Interest 1 1 2   Down, Depressed, Hopeless 0 3 3  PHQ - 2 Score 1 4 5   Altered sleeping 0 0 3  Tired, decreased energy 2 1 2   Change in appetite 0 0 1  Feeling bad or failure about yourself  1 3 3   Trouble concentrating 0 2 3  Moving slowly or fidgety/restless 0 2 1  Suicidal thoughts 0 1 2  PHQ-9  Score 4 13 20   Difficult doing work/chores Somewhat difficult - Very difficult  Some recent data might be hidden    GAD 7 : Generalized Anxiety Score 11/02/2020 06/22/2020 05/25/2020 10/28/2016  Nervous, Anxious, on Edge 1 3 3 3   Control/stop worrying 1 3 3 3   Worry too much - different things 3 3 3 3   Trouble relaxing 0 2 3 3   Restless 1 2 1 3   Easily annoyed or irritable 1 1 2 3   Afraid - awful might happen 2 3 3 2   Total GAD 7 Score 9 17 18 20   Anxiety Difficulty Somewhat difficult Somewhat difficult Very difficult Somewhat difficult    -------------------------------------------------------------------------- O: No physical  exam performed due to remote telephone encounter.  Physical Exam: Patient remotely monitored without video.  Verbal communication appropriate.  Cognition normal.  No results found for this or any previous visit (from the past 2160 hour(s)).  -------------------------------------------------------------------------- A&P:  Problem List Items Addressed This Visit      Other   Bipolar 2 disorder, major depressive episode (HCC)    Currently stable and well controlled with risperdone 2mg  nightly at bedtime for anxiety and clonazepam 0.5mg  PRN for panic.  To review mood handout.  Denies SI/HI.  Medication refills sent to pharmacy on file.  RTC in 3 months      Relevant Medications   risperiDONE (RISPERDAL) 2 MG tablet   Panic attack   Relevant Medications   clonazePAM (KLONOPIN) 0.5 MG tablet      Meds ordered this encounter  Medications  . risperiDONE (RISPERDAL) 2 MG tablet    Sig: Take 1 tablet (2 mg total) by mouth at bedtime.    Dispense:  90 tablet    Refill:  1  . clonazePAM (KLONOPIN) 0.5 MG tablet    Sig: Take 1 tablet (0.5 mg total) by mouth as directed. ONLY FOR SEVERE PANIC ATTACKS 1-2 times a months only.    Dispense:  10 tablet    Refill:  2    Follow-up: - Return in 3 months for anxiety follow up visit  Patient verbalizes understanding with the above medical recommendations including the limitation of remote medical advice.  Specific follow-up and call-back criteria were given for patient to follow-up or seek medical care more urgently if needed.  - Time spent in direct consultation with patient on phone: 6 minutes  , FNP-C [facility] 11/02/2020, 8:37 AM

## 2020-11-02 NOTE — Assessment & Plan Note (Signed)
Currently stable and well controlled with risperdone 2mg  nightly at bedtime for anxiety and clonazepam 0.5mg  PRN for panic.  To review mood handout.  Denies SI/HI.  Medication refills sent to pharmacy on file.  RTC in 3 months

## 2021-03-06 ENCOUNTER — Other Ambulatory Visit: Payer: Self-pay

## 2021-03-06 DIAGNOSIS — F410 Panic disorder [episodic paroxysmal anxiety] without agoraphobia: Secondary | ICD-10-CM

## 2021-03-07 MED ORDER — CLONAZEPAM 0.5 MG PO TABS: 0.5000 mg | ORAL_TABLET | ORAL | 1 refills | Status: DC

## 2021-03-07 NOTE — Telephone Encounter (Signed)
Would you mind filling until he sees me?  Tedd Sias, can you call and get him set up for a physical?

## 2021-03-09 NOTE — Telephone Encounter (Signed)
I attempted to contact the patient to schedule a physical appointment. No answer and voicemail not set-up. I sent the patient a mychart message requesting him to give the office a call to schedule a physical appointment in order to continue getting refills on medications.

## 2021-06-26 ENCOUNTER — Other Ambulatory Visit: Payer: Self-pay | Admitting: Family Medicine

## 2021-06-26 DIAGNOSIS — F410 Panic disorder [episodic paroxysmal anxiety] without agoraphobia: Secondary | ICD-10-CM

## 2021-06-26 NOTE — Telephone Encounter (Signed)
Requested medication (s) are due for refill today: yes  Requested medication (s) are on the active medication list: yes  Last refill:  03/07/21 #10 with 1 refill  Future visit scheduled: yes  Notes to clinic:  Please review for refill    Requested Prescriptions  Pending Prescriptions Disp Refills   clonazePAM (KLONOPIN) 0.5 MG tablet [Pharmacy Med Name: CLONAZEPAM 0.5 MG TAB] 10 tablet     Sig: TAKE 1 TABLET BY MOUTH AS DIRECTED ONLY FOR SEVERE PANIC ATTACKS 1-2 TIMES A MONTH ONLY     Not Delegated - Psychiatry:  Anxiolytics/Hypnotics Failed - 06/26/2021  4:07 PM      Failed - This refill cannot be delegated      Failed - Urine Drug Screen completed in last 360 days      Failed - Valid encounter within last 6 months    Recent Outpatient Visits           7 months ago Bipolar 2 disorder, major depressive episode (HCC)   [facility], Jodelle Gross, FNP   1 year ago Anxiety   [facility], Jodelle Gross, FNP   1 year ago GAD (generalized anxiety disorder)   [facility], Jodelle Gross, FNP   2 years ago Needs flu shot   [facility] Kyung Rudd, Alison Stalling, NP   3 years ago Lower abdominal pain   [facility] Kyung Rudd, Alison Stalling, NP

## 2021-07-25 ENCOUNTER — Other Ambulatory Visit: Payer: Self-pay

## 2021-08-06 ENCOUNTER — Other Ambulatory Visit: Payer: Self-pay

## 2021-08-06 ENCOUNTER — Encounter: Payer: Self-pay | Admitting: Internal Medicine

## 2021-08-06 ENCOUNTER — Ambulatory Visit: Payer: 59 | Admitting: Internal Medicine

## 2021-08-06 VITALS — BP 147/90 | HR 58 | Temp 97.5°F | Resp 18 | Ht 72.0 in | Wt 187.4 lb

## 2021-08-06 DIAGNOSIS — Z23 Encounter for immunization: Secondary | ICD-10-CM

## 2021-08-06 DIAGNOSIS — F3181 Bipolar II disorder: Secondary | ICD-10-CM

## 2021-08-06 DIAGNOSIS — K580 Irritable bowel syndrome with diarrhea: Secondary | ICD-10-CM | POA: Diagnosis not present

## 2021-08-06 DIAGNOSIS — F410 Panic disorder [episodic paroxysmal anxiety] without agoraphobia: Secondary | ICD-10-CM

## 2021-08-06 DIAGNOSIS — F411 Generalized anxiety disorder: Secondary | ICD-10-CM

## 2021-08-06 DIAGNOSIS — F5104 Psychophysiologic insomnia: Secondary | ICD-10-CM

## 2021-08-06 MED ORDER — RISPERIDONE 2 MG PO TABS: 2.0000 mg | ORAL_TABLET | Freq: Every day | ORAL | 1 refills | Status: DC

## 2021-08-06 MED ORDER — CLONAZEPAM 1 MG PO TABS: 1.0000 mg | ORAL_TABLET | Freq: Every day | ORAL | 0 refills | Status: DC | PRN

## 2021-08-06 NOTE — Assessment & Plan Note (Signed)
Not medicated He will try to manage anxiety and depression with current medication regimen Will monitor

## 2021-08-06 NOTE — Assessment & Plan Note (Signed)
Stable on his current dose of Risperdal Support offered

## 2021-08-06 NOTE — Assessment & Plan Note (Signed)
We will increase Clonazepam to 1 mg daily as needed Support offered

## 2021-08-06 NOTE — Progress Notes (Signed)
Subjective:    Patient ID: Dominic Barnes, male    DOB: 1991/03/02, 30 y.o.   MRN: [id number]  HPI  Patient presents the clinic today for follow up of chronic conditions. He is establishing care with me today, transferring care from Malva Cogan, NP.  Anxiety and Bipolar Depression: Chronic, managed on Risperdal.  He reports this works very well for him however he has been out of the prescription for the last week.  He has been feeling more anxious so he has been taking an old prescription of BuSpar.  He reports he takes the Clonazepam about once every 3 weeks but has not been taking it like he should because he feels like the dose is not effective.  He is not currently seeing a therapist.  He denies SI/HI.  IBS: Mainly diarrhea.  He reports this is triggered by his anxiety and depression.  He is not currently taking any medications for this.  Insomnia: He had difficulty staying asleep. He is not longer taking trazodone because the Risperdal seems to make him drowsy enough that he can sleep.  There is no sleep study on file.  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 1 episiode. Approx age 72.   Headache    sinus   Sinus trouble    Testicle pain     Current Outpatient Medications  Medication Sig Dispense Refill   clonazePAM (KLONOPIN) 0.5 MG tablet Take 1 tablet (0.5 mg total) by mouth as directed. ONLY FOR SEVERE PANIC ATTACKS 1-2 times a months only. 10 tablet 1   risperiDONE (RISPERDAL) 2 MG tablet Take 1 tablet (2 mg total) by mouth at bedtime. 90 tablet 1   Multiple Vitamin (MULTIVITAMIN) tablet Take 1 tablet by mouth daily. (Patient not taking: Reported on 08/06/2021)     traZODone (DESYREL) 50 MG tablet Take 0.5-1 tablets (25-50 mg total) by mouth at bedtime as needed for sleep. (Patient not taking: Reported on 08/06/2021) 30 tablet 3   No current facility-administered  medications for this visit.    No Known Allergies  Family History  Problem Relation Age of Onset   Cirrhosis Father    Anxiety disorder Father    Depression Father    Alcohol abuse Father    ADD / ADHD Brother    Kidney disease Neg Hx    Prostate cancer Neg Hx     Social History   Socioeconomic History   Marital status: Single    Spouse name: Not on file   Number of children: 0   Years of education: Not on file   Highest education level: Some college, no degree  Occupational History   Not on file  Tobacco Use   Smoking status: Former    Types: Cigarettes    Quit date: 07/21/2012    Years since quitting: 9.0   Smokeless tobacco: Former   Tobacco comments:    quit 5 years /occasional dip  Vaping Use   Vaping Use: Never used  Substance and Sexual Activity   Alcohol use: Yes    Alcohol/week: 7.0 standard drinks    Types: 7 Cans of beer per week    Comment: socially   Drug use: Yes    Types: Marijuana    Comment: last used 2 weeks    Sexual activity: Not Currently  Other Topics Concern   Not  on file  Social History Narrative   Not on file   Social Determinants of Health   Financial Resource Strain: Not on file  Food Insecurity: Not on file  Transportation Needs: Not on file  Physical Activity: Not on file  Stress: Not on file  Social Connections: Not on file  Intimate Partner Violence: Not on file     Constitutional: Denies fever, malaise, fatigue, headache or abrupt weight changes.  Respiratory: Denies difficulty breathing, shortness of breath, cough or sputum production.   Cardiovascular: Denies chest pain, chest tightness, palpitations or swelling in the hands or feet.  Gastrointestinal: Patient reports intermittent diarrhea.  Denies abdominal pain, bloating, constipation, or blood in the stool.  Skin: Denies redness, rashes, lesions or ulcercations.  Neurological: Patient has a history of insomnia.  Denies dizziness, difficulty with memory, difficulty  with speech or problems with balance and coordination.  Psych: Patient has a history of anxiety and depression.  Denies SI/HI.  No other specific complaints in a complete review of systems (except as listed in HPI above).     Objective:   Physical Exam   BP (!) 147/90 (BP Location: Right Arm, Patient Position: Sitting, Cuff Size: Normal)   Pulse (!) 58   Temp (!) 97.5 F (36.4 C) (Temporal)   Resp 18   Ht 6' (1.829 m)   Wt 187 lb 6.4 oz (85 kg)   SpO2 100%   BMI 25.42 kg/m  Wt Readings from Last 3 Encounters:  08/06/21 187 lb 6.4 oz (85 kg)  06/22/20 165 lb (74.8 kg)  05/25/20 161 lb 9.6 oz (73.3 kg)    General: Appears his stated age, well developed, well nourished in NAD. Skin: Warm, dry and intact. No rashes, lesions or ulcerations noted. HEENT: Head: normal shape and size; Eyes: EOMs intact;  Cardiovascular: Bradycardic with normal rhythm.  Pulmonary/Chest: Normal effort.  Musculoskeletal: No difficulty with gait.  Neurological: Alert and oriented.   Psychiatric: Mood and affect normal.  Mildly anxious appearing. Judgment and thought content normal.     BMET    Component Value Date/Time   NA 140 05/26/2020 0856   K 4.4 05/26/2020 0856   CL 103 05/26/2020 0856   CO2 28 05/26/2020 0856   GLUCOSE 88 05/26/2020 0856   BUN 12 05/26/2020 0856   CREATININE 0.91 05/26/2020 0856   CALCIUM 9.5 05/26/2020 0856   GFRNONAA 114 05/26/2020 0856   GFRAA 132 05/26/2020 0856    Lipid Panel  No results found for: CHOL, TRIG, HDL, CHOLHDL, VLDL, LDLCALC  CBC    Component Value Date/Time   WBC 5.1 05/26/2020 0856   RBC 5.05 05/26/2020 0856   HGB 15.8 05/26/2020 0856   HCT 46.2 05/26/2020 0856   PLT 188 05/26/2020 0856   MCV 91.5 05/26/2020 0856   MCH 31.3 05/26/2020 0856   MCHC 34.2 05/26/2020 0856   RDW 12.5 05/26/2020 0856   LYMPHSABS 1,408 05/26/2020 0856   EOSABS 31 05/26/2020 0856   BASOSABS 51 05/26/2020 0856    Hgb A1C No results found for:  HGBA1C         Assessment & Plan:   Nicki Reaper, NP This visit occurred during the SARS-CoV-2 public health emergency.  Safety protocols were in place, including screening questions prior to the visit, additional usage of staff PPE, and extensive cleaning of exam room while observing appropriate contact time as indicated for disinfecting solutions.

## 2021-08-06 NOTE — Patient Instructions (Signed)
Health Maintenance, Male Adopting a healthy lifestyle and getting preventive care are important in promoting health and wellness. Ask your health care provider about: The right schedule for you to have regular tests and exams. Things you can do on your own to prevent diseases and keep yourself healthy. What should I know about diet, weight, and exercise? Eat a healthy diet  Eat a diet that includes plenty of vegetables, fruits, low-fat dairy products, and lean protein. Do not eat a lot of foods that are high in solid fats, added sugars, or sodium. Maintain a healthy weight Body mass index (BMI) is a measurement that can be used to identify possible weight problems. It estimates body fat based on height and weight. Your health care provider can help determine your BMI and help you achieve or maintain a healthy weight. Get regular exercise Get regular exercise. This is one of the most important things you can do for your health. Most adults should: Exercise for at least 150 minutes each week. The exercise should increase your heart rate and make you sweat (moderate-intensity exercise). Do strengthening exercises at least twice a week. This is in addition to the moderate-intensity exercise. Spend less time sitting. Even light physical activity can be beneficial. Watch cholesterol and blood lipids Have your blood tested for lipids and cholesterol at 29 years of age, then have this test every 5 years. You may need to have your cholesterol levels checked more often if: Your lipid or cholesterol levels are high. You are older than 29 years of age. You are at high risk for heart disease. What should I know about cancer screening? Many types of cancers can be detected early and may often be prevented. Depending on your health history and family history, you may need to have cancer screening at various ages. This may include screening for: Colorectal cancer. Prostate cancer. Skin cancer. Lung  cancer. What should I know about heart disease, diabetes, and high blood pressure? Blood pressure and heart disease High blood pressure causes heart disease and increases the risk of stroke. This is more likely to develop in people who have high blood pressure readings, are of African descent, or are overweight. Talk with your health care provider about your target blood pressure readings. Have your blood pressure checked: Every 3-5 years if you are 18-39 years of age. Every year if you are 40 years old or older. If you are between the ages of 65 and 75 and are a current or former smoker, ask your health care provider if you should have a one-time screening for abdominal aortic aneurysm (AAA). Diabetes Have regular diabetes screenings. This checks your fasting blood sugar level. Have the screening done: Once every three years after age 45 if you are at a normal weight and have a low risk for diabetes. More often and at a younger age if you are overweight or have a high risk for diabetes. What should I know about preventing infection? Hepatitis B If you have a higher risk for hepatitis B, you should be screened for this virus. Talk with your health care provider to find out if you are at risk for hepatitis B infection. Hepatitis C Blood testing is recommended for: Everyone born from 1945 through 1965. Anyone with known risk factors for hepatitis C. Sexually transmitted infections (STIs) You should be screened each year for STIs, including gonorrhea and chlamydia, if: You are sexually active and are younger than 29 years of age. You are older than 29 years   of age and your health care provider tells you that you are at risk for this type of infection. Your sexual activity has changed since you were last screened, and you are at increased risk for chlamydia or gonorrhea. Ask your health care provider if you are at risk. Ask your health care provider about whether you are at high risk for HIV.  Your health care provider may recommend a prescription medicine to help prevent HIV infection. If you choose to take medicine to prevent HIV, you should first get tested for HIV. You should then be tested every 3 months for as long as you are taking the medicine. Follow these instructions at home: Lifestyle Do not use any products that contain nicotine or tobacco, such as cigarettes, e-cigarettes, and chewing tobacco. If you need help quitting, ask your health care provider. Do not use street drugs. Do not share needles. Ask your health care provider for help if you need support or information about quitting drugs. Alcohol use Do not drink alcohol if your health care provider tells you not to drink. If you drink alcohol: Limit how much you have to 0-2 drinks a day. Be aware of how much alcohol is in your drink. In the [place] one drink equals one 12 oz bottle of beer (355 mL), one 5 oz glass of wine (148 mL), or one 1 oz glass of hard liquor (44 mL). General instructions Schedule regular health, dental, and eye exams. Stay current with your vaccines. Tell your health care provider if: You often feel depressed. You have ever been abused or do not feel safe at home. Summary Adopting a healthy lifestyle and getting preventive care are important in promoting health and wellness. Follow your health care provider's instructions about healthy diet, exercising, and getting tested or screened for diseases. Follow your health care provider's instructions on monitoring your cholesterol and blood pressure. This information is not intended to replace advice given to you by your health care provider. Make sure you discuss any questions you have with your health care provider. Document Revised: 12/15/2020 Document Reviewed: 09/30/2018 Elsevier Patient Education  2022 Elsevier Inc.  

## 2021-08-06 NOTE — Assessment & Plan Note (Signed)
Currently not an issue He is no longer taking Trazodone Will monitor

## 2021-08-06 NOTE — Assessment & Plan Note (Signed)
We will increase Clonazepam to 1 mg daily. Support offered

## 2021-10-18 ENCOUNTER — Other Ambulatory Visit: Payer: Self-pay | Admitting: Internal Medicine

## 2021-10-18 DIAGNOSIS — F3181 Bipolar II disorder: Secondary | ICD-10-CM

## 2021-10-18 NOTE — Telephone Encounter (Signed)
Requested medication (s) are due for refill today: Yes  Requested medication (s) are on the active medication list: Yes  Last refill:  08/06/21  Future visit scheduled: Yes   Notes to clinic: Refill cannot be delegated.        Requested Prescriptions  Pending Prescriptions Disp Refills   risperiDONE (RISPERDAL) 2 MG tablet [Pharmacy Med Name: RISPERIDONE 2 MG TAB] 90 tablet 1    Sig: TAKE 1 TABLET BY MOUTH AT BEDTIME     Not Delegated - Psychiatry:  Antipsychotics - Second Generation (Atypical) - risperidone Failed - 10/18/2021 10:00 AM      Failed - This refill cannot be delegated      Failed - Prolactin Level (serum) in normal range and within 180 days    No results found for: PROLACTIN, TOTPROLACTIN, LABPROL        Failed - ALT in normal range and within 180 days    ALT  Date Value Ref Range Status  05/26/2020 42 9 - 46 U/L Final          Failed - AST in normal range and within 180 days    AST  Date Value Ref Range Status  05/26/2020 28 10 - 40 U/L Final          Passed - Valid encounter within last 6 months    Recent Outpatient Visits           2 months ago Need for immunization against influenza   [facility] [place], [place], NP   11 months ago Bipolar 2 disorder, major depressive episode (HCC)   [facility], Jodelle Gross, FNP   1 year ago Anxiety   [facility], Jodelle Gross, FNP   1 year ago GAD (generalized anxiety disorder)   [facility], Jodelle Gross, FNP   3 years ago Needs flu shot   [facility] [place], Alison Stalling, NP       Future Appointments             In 3 months Baity, Salvadore Oxford, NP [facility]             clonazePAM (KLONOPIN) 1 MG tablet [Pharmacy Med Name: CLONAZEPAM 1 MG TAB] 10 tablet     Sig: TAKE 1 TABLET BY MOUTH ONCE DAILY AS NEEDED FOR ANXIETY     Not Delegated - Psychiatry:  Anxiolytics/Hypnotics Failed -  10/18/2021 10:00 AM      Failed - This refill cannot be delegated      Failed - Urine Drug Screen completed in last 360 days      Passed - Valid encounter within last 6 months    Recent Outpatient Visits           2 months ago Need for immunization against influenza   [facility] [place], [place], NP   11 months ago Bipolar 2 disorder, major depressive episode [facility])   [facility], Jodelle Gross, FNP   1 year ago Anxiety   [facility], Jodelle Gross, FNP   1 year ago GAD (generalized anxiety disorder)   [facility] [place], Jodelle Gross, FNP   3 years ago Needs flu shot   [facility], Alison Stalling, NP       Future Appointments             In 3  months Lorre Munroe, NP [facility]

## 2022-01-03 ENCOUNTER — Emergency Department
Admission: EM | Admit: 2022-01-03 | Discharge: 2022-01-03 | Disposition: A | Discharge status: Home / Self Care | Payer: PRIVATE HEALTH INSURANCE | Attending: Emergency Medicine | Admitting: Emergency Medicine

## 2022-01-03 ENCOUNTER — Other Ambulatory Visit: Payer: Self-pay

## 2022-01-03 DIAGNOSIS — Z23 Encounter for immunization: Secondary | ICD-10-CM | POA: Diagnosis not present

## 2022-01-03 DIAGNOSIS — Y990 Civilian activity done for income or pay: Secondary | ICD-10-CM | POA: Diagnosis not present

## 2022-01-03 DIAGNOSIS — J45909 Unspecified asthma, uncomplicated: Secondary | ICD-10-CM | POA: Insufficient documentation

## 2022-01-03 DIAGNOSIS — W231XXA Caught, crushed, jammed, or pinched between stationary objects, initial encounter: Secondary | ICD-10-CM | POA: Insufficient documentation

## 2022-01-03 DIAGNOSIS — S71112A Laceration without foreign body, left thigh, initial encounter: Secondary | ICD-10-CM | POA: Diagnosis not present

## 2022-01-03 DIAGNOSIS — S79922A Unspecified injury of left thigh, initial encounter: Secondary | ICD-10-CM | POA: Diagnosis present

## 2022-01-03 MED ORDER — TETANUS-DIPHTH-ACELL PERTUSSIS 5-2.5-18.5 LF-MCG/0.5 IM SUSY
0.5000 mL | PREFILLED_SYRINGE | Freq: Once | INTRAMUSCULAR | Status: AC
  Administered 2022-01-03: 0.5 mL via INTRAMUSCULAR
  Filled 2022-01-03: qty 0.5

## 2022-01-03 MED ORDER — BACITRACIN-NEOMYCIN-POLYMYXIN 400-5-5000 EX OINT
TOPICAL_OINTMENT | Freq: Once | CUTANEOUS | Status: AC
  Administered 2022-01-03: 1 via TOPICAL
  Filled 2022-01-03: qty 1

## 2022-01-03 MED ORDER — LIDOCAINE-EPINEPHRINE 2 %-1:100000 IJ SOLN
10.0000 mL | Freq: Once | INTRAMUSCULAR | Status: AC
Start: 2022-01-03 — End: 2022-01-03
  Administered 2022-01-03: 10 mL via INTRADERMAL
  Filled 2022-01-03: qty 1

## 2022-01-03 NOTE — ED Triage Notes (Signed)
Pt comes with c/o puncture wound to left upper back of thigh. Pt states he was at work and was coming off of a [employer] and the Surveyor, mining caught his leg. Pt has it wrapped but bleeding present on pants. ? ?Pt denies wanting to file for workers comp ?

## 2022-01-03 NOTE — ED Notes (Signed)
31 yom with a c/c of laceration/puncture to the back of his left thigh at work today. The pt went to [facility] but the provider there advised they were unable to close the wound properly.  ?

## 2022-01-03 NOTE — ED Provider Notes (Signed)
? ?[facility] ?Provider Note ? ? ? None  ?  (approximate) ? ? ?History  ? ?Laceration ? ? ?HPI ? ?Dominic Barnes is a 31 y.o. male with history of asthma, anxiety and as listed in EMR presents to the emergency department for treatment and evaluation after sustaining a laceration to the left upper posterior thigh.  While at work, he was coming off a [employer] officer and the [employer] his pants and cut his leg. Injury occurred around noon today. ? ?  ? ? ?Physical Exam  ? ?Triage Vital Signs: ?ED Triage Vitals  ?Enc Vitals Group  ?   BP 01/03/22 1421 (!) 153/104  ?   Pulse Rate 01/03/22 1421 (!) 126  ?   Resp 01/03/22 1421 19  ?   Temp 01/03/22 1421 98.3 ?F (36.8 ?C)  ?   Temp src --   ?   SpO2 01/03/22 1421 99 %  ?   Weight --   ?   Height --   ?   Head Circumference --   ?   Peak Flow --   ?   Pain Score 01/03/22 1421 6  ?   Pain Loc --   ?   Pain Edu? --   ?   Excl. in GC? --   ? ? ?Most recent vital signs: ?Vitals:  ? 01/03/22 1421 01/03/22 1547  ?BP: (!) 153/104 (!) 141/82  ?Pulse: (!) 126 (!) 115  ?Resp: 19 18  ?Temp: 98.3 ?F (36.8 ?C)   ?SpO2: 99% 100%  ? ? ?General: Awake, no distress.  ?CV:  Good peripheral perfusion.  ?Resp:  Normal effort.  ?Abd:  No distention.  ?Other:   ? ? ?ED Results / Procedures / Treatments  ? ?Labs ?(all labs ordered are listed, but only abnormal results are displayed) ?Labs Reviewed - No data to display ? ? ?EKG ? ?Not indicated. ? ? ?RADIOLOGY ? ?Image and radiology report reviewed by me. ? ?Not indicated. ? ?PROCEDURES: ? ?Critical Care performed: No ? ?[name].Laceration Repair ? ?Date/Time: 01/07/2022 8:40 PM ?Performed by: Chinita Pester, FNP ?Authorized by: Chinita Pester, FNP  ? ?Consent:  ?  Consent obtained:  Verbal ?  Consent given by:  Patient ?  Risks discussed:  Infection, poor cosmetic result and poor wound healing ?Universal protocol:  ?  Patient identity confirmed:  Verbally with patient ?Laceration details:  ?  Location:  Leg ?  Leg location:  L lower  leg ?  Length (cm):  3 ?Pre-procedure details:  ?  Preparation:  Patient was prepped and draped in usual sterile fashion ?Exploration:  ?  Wound exploration: entire depth of wound visualized   ?Treatment:  ?  Area cleansed with:  Povidone-iodine and saline ?  Amount of cleaning:  Standard ?  Irrigation method:  Syringe ?  Layers/structures repaired:  Deep subcutaneous ?Deep subcutaneous:  ?  Suture size:  4-0 ?  Suture material:  Monocryl ?  Suture technique:  Figure eight ?  Number of sutures:  4 ?Skin repair:  ?  Repair method:  Sutures ?  Suture size:  4-0 ?  Suture material:  Nylon ?  Suture technique:  Simple interrupted ?  Number of sutures:  6 ?Approximation:  ?  Approximation:  Close ?Repair type:  ?  Repair type:  Intermediate ?Post-procedure details:  ?  Dressing:  Antibiotic ointment and non-adherent dressing ?  Procedure completion:  Tolerated well, no immediate complications ? ? ?MEDICATIONS ORDERED  IN ED: ?Medications  ?lidocaine-EPINEPHrine (XYLOCAINE W/EPI) 2 %-1:100000 (with pres) injection 10 mL (10 mLs Intradermal Given by Other 01/03/22 1526)  ?Tdap (BOOSTRIX) injection 0.5 mL (0.5 mLs Intramuscular Given 01/03/22 1544)  ?neomycin-bacitracin-polymyxin (NEOSPORIN) ointment packet (1 application. Topical Given 01/03/22 1607)  ? ? ? ?IMPRESSION / MDM / ASSESSMENT AND PLAN / ED COURSE  ? ?I have reviewed the triage note. ? ?Differential diagnosis includes, but is not limited to, puncture wound, laceration. ? ?31 year old male presents to the ER for evaluation after laceration to the posterior thigh. ? ?See HPI. Wound repaired as described above. Home care instructions reviewed. He is to follow up with primary care or urgent care for suture removal in 10 days or sooner for sign or concern of infection. Tdap updated today. ? ?  ? ? ?FINAL CLINICAL IMPRESSION(S) / ED DIAGNOSES  ? ?Final diagnoses:  ?Laceration of left thigh, initial encounter  ? ? ? ?Rx / DC Orders  ? ?ED Discharge Orders   ? ? None  ? ?   ? ? ? ?Note:  This document was prepared using Dragon voice recognition software and may include unintentional dictation errors. ?  Chinita Pester, FNP ?01/07/22 2047 ? ?  ?Phineas Semen, MD ?01/07/22 2057 ? ?

## 2022-01-03 NOTE — ED Notes (Signed)
Non-adhesive dressing applied to site of injury. ?

## 2022-01-03 NOTE — Discharge Instructions (Signed)
Do not get the sutured area wet for 24 hours. After 24 hours, shower/bathe as usual and pat the area dry. °Change the bandage 2 times per day and apply antibiotic ointment. °Leave open to air when at no risk of getting the area dirty, but cover at night before bed. °See your PCP or go to Urgent Care in 10 days for suture removal or sooner for signs or concern of infection. ° °

## 2022-01-03 NOTE — ED Notes (Signed)
Lidocaine to provider CT. Provider at bedside to administer lidocaine; this RN will complete Tdap once provider finished.  ?

## 2022-01-15 ENCOUNTER — Other Ambulatory Visit: Payer: Self-pay

## 2022-01-15 ENCOUNTER — Ambulatory Visit: Payer: Self-pay | Admitting: Family Medicine

## 2022-01-15 ENCOUNTER — Encounter: Payer: Self-pay | Admitting: Family Medicine

## 2022-01-15 VITALS — BP 128/84 | HR 77 | Ht 72.0 in | Wt 188.6 lb

## 2022-01-15 DIAGNOSIS — F3181 Bipolar II disorder: Secondary | ICD-10-CM

## 2022-01-15 DIAGNOSIS — F411 Generalized anxiety disorder: Secondary | ICD-10-CM

## 2022-01-15 DIAGNOSIS — F5104 Psychophysiologic insomnia: Secondary | ICD-10-CM

## 2022-01-15 DIAGNOSIS — Z4802 Encounter for removal of sutures: Secondary | ICD-10-CM

## 2022-01-15 DIAGNOSIS — S71112A Laceration without foreign body, left thigh, initial encounter: Secondary | ICD-10-CM

## 2022-01-15 MED ORDER — CLONAZEPAM 1 MG PO TABS: 1.0000 mg | ORAL_TABLET | Freq: Every day | ORAL | 0 refills | Status: DC | PRN

## 2022-01-15 NOTE — Progress Notes (Signed)
? ?Subjective:  ? ? Patient ID: Dominic Barnes, male    DOB: 1991/10/12, 31 y.o.   MRN: [id number] ? ?Dominic Barnes is a 31 y.o. male presenting on 01/15/2022 for Suture / Staple Removal ? ?PCP Nicki Reaperegina Baity, FNP  ? ?HPI ? ?Insomnia ?Bipolar, type 2 depression ?Currently doing well with sleep managed on Risperidone nightly. Needs re order but has a refill ?Takes Klonopin 1mg  nightly PRN for insomnia, 10 pills lasts several months needs re order today. ? ?Left Posterior Thigh Laceration, follow up suture removal ?Reports new onset issue recently accidental laceration on trailer hitch, seen in ED 01/03/22 for laceration and had sutures placed in ED. He has done well, using triple antibiotic ointment. Keeping wound clean. Mild drainage only on bandage, no bleed or oozing. Denies fever or chills or spreading redness. ? ? ? ?  08/06/2021  ?  8:40 AM 11/02/2020  ?  8:10 AM 06/22/2020  ? 10:13 AM  ?Depression screen PHQ 2/9  ?Decreased Interest 2 1 1   ?Down, Depressed, Hopeless 3 0 3  ?PHQ - 2 Score 5 1 4   ?Altered sleeping 2 0 0  ?Tired, decreased energy 1 2 1   ?Change in appetite 2 0 0  ?Feeling bad or failure about yourself  2 1 3   ?Trouble concentrating 0 0 2  ?Moving slowly or fidgety/restless 1 0 2  ?Suicidal thoughts 0 0 1  ?PHQ-9 Score 13 4 13   ?Difficult doing work/chores Extremely dIfficult Somewhat difficult   ? ? ?Social History  ? ?Tobacco Use  ? Smoking status: Former  ?  Types: Cigarettes  ?  Quit date: 07/21/2012  ?  Years since quitting: 9.4  ? Smokeless tobacco: Former  ? Tobacco comments:  ?  quit 5 years /occasional dip  ?Vaping Use  ? Vaping Use: Never used  ?Substance Use Topics  ? Alcohol use: Yes  ?  Alcohol/week: 7.0 standard drinks  ?  Types: 7 Cans of beer per week  ?  Comment: socially  ? Drug use: Yes  ?  Types: Marijuana  ?  Comment: last used 2 weeks   ? ? ?Review of Systems ?Per HPI unless specifically indicated above ? ?   ?Objective:  ?  ?BP 128/84   Pulse 77   Ht 6' (1.829 m)   Wt 188 lb  9.6 oz (85.5 kg)   SpO2 99%   BMI 25.58 kg/m?   ?Wt Readings from Last 3 Encounters:  ?01/15/22 188 lb 9.6 oz (85.5 kg)  ?01/03/22 187 lb 6.3 oz (85 kg)  ?08/06/21 187 lb 6.4 oz (85 kg)  ?  ?Physical Exam ?Vitals and nursing note reviewed.  ?Constitutional:   ?   General: He is not in acute distress. ?   Appearance: Normal appearance. He is well-developed. He is not diaphoretic.  ?   Comments: Well-appearing, comfortable, cooperative  ?HENT:  ?   Head: Normocephalic and atraumatic.  ?Eyes:  ?   General:     ?   Right eye: No discharge.     ?   Left eye: No discharge.  ?   Conjunctiva/sclera: Conjunctivae normal.  ?Cardiovascular:  ?   Rate and Rhythm: Normal rate.  ?Pulmonary:  ?   Effort: Pulmonary effort is normal.  ?Skin: ?   General: Skin is warm and dry.  ?   Findings: No erythema or rash.  ?   Comments: Left posterior thigh laceration as pictured  ?Neurological:  ?   Mental Status:  He is alert and oriented to person, place, and time.  ?Psychiatric:     ?   Mood and Affect: Mood normal.     ?   Behavior: Behavior normal.     ?   Thought Content: Thought content normal.  ?   Comments: Well groomed, good eye contact, normal speech and thoughts  ? ? ?________________________________________________________ ?PROCEDURE NOTE ?Date - 01/15/22 ?Suture Removal - Left posterior thigh location ?- Location / Date Sutures were placed: [facility] ED 01/03/22 ?- # of Sutures: 6 ?Discussed benefits and risks (including pain, bleeding, infection, wound separation). ?Verbal consent given by patient ?Medication:  None  ?Time Out taken ?Examination of the sutured laceration on left posterior thigh appears to be well healed. Area cleansed with alcohol wipes. Appropriate # of sutures counted and confirmed. Removal of sutures one by one using sterile pick ups to adjust and sterile scissors to cut one side of suture away from knot. Removal was uncomplicated. ?- All sutures were successfully removed and wound remains intact ?- 6 out of 6  sutures were successfully removed, remaining sutures were left in place due to concern about integrity of the wound and recommend further healing before removal. See A&P. ? ? ? ?Left posterior thigh ? ?X 6 sutures ? ? ? ? ?Results for orders placed or performed in visit on 05/25/20  ?CBC with Differential  ?Result Value Ref Range  ? WBC 5.1 3.8 - 10.8 Thousand/uL  ? RBC 5.05 4.20 - 5.80 Million/uL  ? Hemoglobin 15.8 13.2 - 17.1 g/dL  ? HCT 46.2 38.5 - 50.0 %  ? MCV 91.5 80.0 - 100.0 fL  ? MCH 31.3 27.0 - 33.0 pg  ? MCHC 34.2 32.0 - 36.0 g/dL  ? RDW 12.5 11.0 - 15.0 %  ? Platelets 188 140 - 400 Thousand/uL  ? MPV 11.6 7.5 - 12.5 fL  ? Neutro Abs 3,290 1,500 - 7,800 cells/uL  ? Lymphs Abs 1,408 850 - 3,900 cells/uL  ? Absolute Monocytes 321 200 - 950 cells/uL  ? Eosinophils Absolute 31 15 - 500 cells/uL  ? Basophils Absolute 51 0 - 200 cells/uL  ? Neutrophils Relative % 64.5 %  ? Total Lymphocyte 27.6 %  ? Monocytes Relative 6.3 %  ? Eosinophils Relative 0.6 %  ? Basophils Relative 1.0 %  ?COMPLETE METABOLIC PANEL WITH GFR  ?Result Value Ref Range  ? Glucose, Bld 88 65 - 99 mg/dL  ? BUN 12 7 - 25 mg/dL  ? Creat 0.91 0.60 - 1.35 mg/dL  ? GFR, Est Non African American 114 > OR = 60 mL/min/1.102m2  ? GFR, Est African American 132 > OR = 60 mL/min/1.56m2  ? BUN/Creatinine Ratio NOT APPLICABLE 6 - 22 (calc)  ? Sodium 140 135 - 146 mmol/L  ? Potassium 4.4 3.5 - 5.3 mmol/L  ? Chloride 103 98 - 110 mmol/L  ? CO2 28 20 - 32 mmol/L  ? Calcium 9.5 8.6 - 10.3 mg/dL  ? Total Protein 7.2 6.1 - 8.1 g/dL  ? Albumin 4.8 3.6 - 5.1 g/dL  ? Globulin 2.4 1.9 - 3.7 g/dL (calc)  ? AG Ratio 2.0 1.0 - 2.5 (calc)  ? Total Bilirubin 3.6 (H) 0.2 - 1.2 mg/dL  ? Alkaline phosphatase (APISO) 45 36 - 130 U/L  ? AST 28 10 - 40 U/L  ? ALT 42 9 - 46 U/L  ?Thyroid Panel With TSH  ?Result Value Ref Range  ? T3 Uptake 41 (H) 22 - 35 %  ?  T4, Total 6.9 4.9 - 10.5 mcg/dL  ? Free Thyroxine Index 2.8 1.4 - 3.8  ? TSH 0.71 0.40 - 4.50 mIU/L  ? ?   ?Assessment &  Plan:  ? ?Problem List Items Addressed This Visit   ? ? Insomnia  ? Relevant Medications  ? clonazePAM (KLONOPIN) 1 MG tablet  ? GAD (generalized anxiety disorder)  ? Bipolar 2 disorder, major depressive episode (HCC) - Primary  ? ?Other Visit Diagnoses   ? ? Visit for suture removal      ? Laceration of left thigh, initial encounter      ? ?  ?  ?Insomnia / GAD / Bipolar 2  ?Managed on medication ?Continue Risperidone, he has refill available at pharmacy ?Re order Klonopin 1mg  nightly PRN 10 pills as previously prescribed, 0 refill ?Can return to see PCP as planned ? ?Laceration - healing well ?L thigh ?Day 12 now, suture removal today completed 6 out of 6  ? ? ?Meds ordered this encounter  ?Medications  ? clonazePAM (KLONOPIN) 1 MG tablet  ?  Sig: Take 1 tablet (1 mg total) by mouth daily as needed for anxiety.  ?  Dispense:  10 tablet  ?  Refill:  0  ? ? ? ?Follow up plan: ?Return if symptoms worsen or fail to improve. ? ? ? , DO ?[facility] ?[facility] ?01/15/2022, 9:59 AM ?

## 2022-01-15 NOTE — Patient Instructions (Addendum)
Thank you for coming to the office today. ? ?Refilled Klonopin for 10 pills ?Check w pharmacy for refill risperidone ?Suture removal today x 6 ?Use topical triple antibiotic ointment ?Keep wound clean ? ?Please schedule a Follow-up Appointment to: Return if symptoms worsen or fail to improve. ? ?If you have any other questions or concerns, please feel free to call the office or send a message through MyChart. You may also schedule an earlier appointment if necessary. ? ?Additionally, you may be receiving a survey about your experience at our office within a few days to 1 week by e-mail or mail. We value your feedback. ? ?Saralyn Pilar, DO ?[facility], [place] ?

## 2022-02-04 ENCOUNTER — Ambulatory Visit: Payer: 59 | Admitting: Internal Medicine

## 2022-02-18 ENCOUNTER — Ambulatory Visit: Payer: 59 | Admitting: Internal Medicine

## 2022-02-18 ENCOUNTER — Encounter: Payer: Self-pay | Admitting: Internal Medicine

## 2022-02-18 VITALS — BP 134/82 | HR 61 | Temp 97.1°F | Ht 72.0 in | Wt 190.0 lb

## 2022-02-18 DIAGNOSIS — Z6827 Body mass index (BMI) 27.0-27.9, adult: Secondary | ICD-10-CM | POA: Insufficient documentation

## 2022-02-18 DIAGNOSIS — E663 Overweight: Secondary | ICD-10-CM | POA: Insufficient documentation

## 2022-02-18 DIAGNOSIS — Z1159 Encounter for screening for other viral diseases: Secondary | ICD-10-CM | POA: Diagnosis not present

## 2022-02-18 DIAGNOSIS — Z0001 Encounter for general adult medical examination with abnormal findings: Secondary | ICD-10-CM

## 2022-02-18 DIAGNOSIS — Z6825 Body mass index (BMI) 25.0-25.9, adult: Secondary | ICD-10-CM | POA: Diagnosis not present

## 2022-02-18 DIAGNOSIS — F3181 Bipolar II disorder: Secondary | ICD-10-CM

## 2022-02-18 DIAGNOSIS — Z6826 Body mass index (BMI) 26.0-26.9, adult: Secondary | ICD-10-CM | POA: Insufficient documentation

## 2022-02-18 DIAGNOSIS — R69 Illness, unspecified: Secondary | ICD-10-CM | POA: Diagnosis not present

## 2022-02-18 MED ORDER — RISPERIDONE 2 MG PO TABS: 2.0000 mg | ORAL_TABLET | Freq: Every day | ORAL | 1 refills | Status: DC

## 2022-02-18 NOTE — Assessment & Plan Note (Signed)
Encouraged diet and exercise for weight loss ?

## 2022-02-18 NOTE — Patient Instructions (Signed)
Caring for Your Mental Health Mental health is emotional, psychological, and social well-being. Mental health is just as important as physical health. In fact, mental and physical health are connected, and you need both to be healthy. Some signs of good mental health (well-being) include: Being able to attend to tasks at home, school, or work. Being able to manage stress and emotions. Practicing self-care, which may include: A regular exercise pattern. A reasonably healthy diet. Supportive and trusting relationships. The ability to relax and calm yourself (self-calm). Having pleasurable hobbies and activities to do. Believing that you have meaning and purpose in your life. Recovering and adjusting after facing challenges (resilience). You can take steps to build or strengthen these mentally healthy behaviors. There are resources and support to help you with this. Why is caring for mental health important? Caring for your mental health is a big part of staying healthy. Everyone has times when feelings, thoughts, or situations feel overwhelming. Mental health means having the skills to manage what feels overwhelming. If this sense of being overwhelmed persists, however, you might need some help. If you have some of the following signs, you may need to take better care of your mental health or seek help from a health care provider or mental health professional: Problems with energy or focus. Changes in eating habits. Problems sleeping, such as sleeping too much or not enough. Emotional distress, such as anger, sadness, depression, or anxiety. Major changes in your relationships. Losing interest in life or activities that you used to enjoy. If you have any of these symptoms on most days for 2 weeks or longer: Talk with a close friend or family member about how you are feeling. Contact your health care provider to discuss your symptoms. Consider working with a mental health professional. Your  health care provider, family, or friends may be able to recommend a therapist. How to promote emotional and mental health Managing emotions Learn to identify emotions and be honest with yourself about what you are feeling. Recognizing your emotions is the first step in learning to deal with them. Practice ways to appropriately express feelings. Remember that you can control your feelings. They do not control you. Practice stress management techniques, such as: Relaxation techniques, like breathing or muscle relaxation exercises. Exercise. Regular activity can lower your stress level. Changing what you can change and accepting what you cannot change. Build up your resilience so that you can recover and adjust after big problems or challenges. Practice resilient behaviors and attitudes: Set long-term goals and focus on them. Develop and maintain healthy, supportive relationships. Take care of yourself physically by eating a healthy diet, getting plenty of sleep, and exercising regularly. Develop self-awareness. Ask others to give feedback about how they see you. Practice mindfulness meditation to help you stay calm when dealing with daily challenges. Learn to respond to situations in healthy ways, rather than reacting with your emotions. Keep a positive attitude, and believe in yourself. Your view of yourself affects your mental health. Develop your listening and empathy skills. These will help you deal with difficult situations and communications. Practice acts of kindness and helpfulness toward others. Spend time in nature being in the moment and appreciating its beauty. Remember that emotions can be used as a good source of communication and are a great source of energy. Try to laugh and find humor in life. Sleeping Get the right amount and quality of sleep. Sleep has a big impact on physical and mental health. To improve your sleep:   Go to bed and wake up around the same time every  day. Limit screen time before bedtime. This includes the use of your mobile phone, TV, computer, and tablet. Keep your bedroom dark and cool. Activity Exercise or do some physical activity regularly. This helps: Keep your body strong, especially during times of stress. Get rid of chemicals in your body (hormones) that build up when you are stressed. Build up your resilience.  Eating and drinking  Eat a healthy diet that includes whole grains, vegetables, fresh fruits, and lean proteins. If you have questions about what foods are best for you, ask your health care provider. Try not to turn to sweet, salty, or otherwise unhealthy foods when you are tired or unhappy. This can lead to unwanted weight gain and is not a healthy way to cope with emotions. Where to find more information You can find more information and guidance about how to care for your mental health from: National Alliance on Mental Illness (NAMI): www.nami.org National Institute of Mental Health: www.nimh.nih.gov Centers for Disease Control and Prevention: www.cdc.gov Contact a health care provider if: You lose interest in being with others or you do not want to leave the house. You have a hard time completing your normal activities or you have less energy than normal. You cannot stay focused or you have problems with memory. You feel that your senses are heightened, and this makes you upset or concerned. You feel nervous or have rapid mood changes. You are sleeping or eating more or less than normal. You question reality or you show odd behavior that disturbs you or others. Get help right away if: You have thoughts about hurting yourself or others. Get help right away if you feel like you may hurt yourself or others, or have thoughts about taking your own life. Go to your nearest emergency room or: Call 911. Call the National Suicide Prevention Lifeline at 1-800-273-8255 or 988 in the [place] This is open 24 hours a  day. Text the Crisis Text Line at 741741. Summary Mental health is not just the absence of mental illness. It involves understanding your emotions and behaviors, and taking steps to manage them in a healthy way. If you have symptoms of mental or emotional distress, get help from family, friends, a health care provider, or a mental health professional. Practice good mental health behaviors such as stress management skills, self-calming skills, exercise, healthy sleeping and eating, and supportive relationships. This information is not intended to replace advice given to you by your health care provider. Make sure you discuss any questions you have with your health care provider. Document Revised: 05/03/2021 Document Reviewed: 04/27/2021 Elsevier Patient Education  2023 Elsevier Inc.  

## 2022-02-18 NOTE — Progress Notes (Signed)
? ?Subjective:  ? ? Patient ID: Dominic Barnes, male    DOB: 03/06/1991, 31 y.o.   MRN: [id number] ? ?HPI ? ?Patient presents to clinic today for his annual exam. ? ?Flu: 07/2021 ?Tetanus: 12/2021 ?COVID: never ?Dentist: biannually ? ?Diet: He does eat meat. He consumes some fruits and veggies. He does eat some fried foods. He drinks mostly Gatorade, energy drinks, coffee, water. ?Exercise: None ? ?Review of Systems ? ?   ?Past Medical History:  ?Diagnosis Date  ? Anxiety   ? Arthritis   ? right elbow before surgery  ? Asthma   ? exercise induced. 1 episiode. Approx age 71.  ? Headache   ? sinus  ? Sinus trouble   ? Testicle pain   ? ? ?Current Outpatient Medications  ?Medication Sig Dispense Refill  ? clonazePAM (KLONOPIN) 1 MG tablet Take 1 tablet (1 mg total) by mouth daily as needed for anxiety. 10 tablet 0  ? risperiDONE (RISPERDAL) 2 MG tablet TAKE 1 TABLET BY MOUTH AT BEDTIME 90 tablet 1  ? ?No current facility-administered medications for this visit.  ? ? ?No Known Allergies ? ?Family History  ?Problem Relation Age of Onset  ? Cirrhosis Father   ? Anxiety disorder Father   ? Depression Father   ? Alcohol abuse Father   ? ADD / ADHD Brother   ? Kidney disease Neg Hx   ? Prostate cancer Neg Hx   ? ? ?Social History  ? ?Socioeconomic History  ? Marital status: Single  ?  Spouse name: Not on file  ? Number of children: 0  ? Years of education: Not on file  ? Highest education level: Some college, no degree  ?Occupational History  ? Not on file  ?Tobacco Use  ? Smoking status: Former  ?  Types: Cigarettes  ?  Quit date: 07/21/2012  ?  Years since quitting: 9.5  ? Smokeless tobacco: Former  ? Tobacco comments:  ?  quit 5 years /occasional dip  ?Vaping Use  ? Vaping Use: Never used  ?Substance and Sexual Activity  ? Alcohol use: Yes  ?  Alcohol/week: 7.0 standard drinks  ?  Types: 7 Cans of beer per week  ?  Comment: socially  ? Drug use: Yes  ?  Types: Marijuana  ?  Comment: last used 2 weeks   ? Sexual activity:  Not Currently  ?Other Topics Concern  ? Not on file  ?Social History Narrative  ? Not on file  ? ?Social Determinants of Health  ? ?Financial Resource Strain: Not on file  ?Food Insecurity: Not on file  ?Transportation Needs: Not on file  ?Physical Activity: Not on file  ?Stress: Not on file  ?Social Connections: Not on file  ?Intimate Partner Violence: Not on file  ? ? ? ?Constitutional: Denies fever, malaise, fatigue, headache or abrupt weight changes.  ?HEENT: Denies eye pain, eye redness, ear pain, ringing in the ears, wax buildup, runny nose, nasal congestion, bloody nose, or sore throat. ?Respiratory: Denies difficulty breathing, shortness of breath, cough or sputum production.   ?Cardiovascular: Denies chest pain, chest tightness, palpitations or swelling in the hands or feet.  ?Gastrointestinal: Patient reports intermittent reflux, alternating constipation and diarrhea.  Denies abdominal pain, bloating, or blood in the stool.  ?GU: Denies urgency, frequency, pain with urination, burning sensation, blood in urine, odor or discharge. ?Musculoskeletal: Denies decrease in range of motion, difficulty with gait, muscle pain or joint pain and swelling.  ?Skin: Denies redness,  rashes, lesions or ulcercations.  ?Neurological: Patient reports insomnia.  Denies dizziness, difficulty with memory, difficulty with speech or problems with balance and coordination.  ?Psych: Patient has a history of anxiety and depression.  Denies SI/HI. ? ?No other specific complaints in a complete review of systems (except as listed in HPI above). ? ?Objective:  ? Physical Exam ? ? ?BP 134/82 (BP Location: Left Arm, Patient Position: Sitting, Cuff Size: Large)   Pulse 61   Temp (!) 97.1 ?F (36.2 ?C) (Temporal)   Ht 6' (1.829 m)   Wt 190 lb (86.2 kg)   SpO2 100%   BMI 25.77 kg/m?  ? ?Wt Readings from Last 3 Encounters:  ?01/15/22 188 lb 9.6 oz (85.5 kg)  ?01/03/22 187 lb 6.3 oz (85 kg)  ?08/06/21 187 lb 6.4 oz (85 kg)  ? ? ?General:  Appears her stated age, overweight in NAD. ?Skin: Warm, dry and intact.  ?HEENT: Head: normal shape and size; Eyes: sclera white, no icterus, conjunctiva pink, PERRLA and EOMs intact;  ?Neck:  Neck supple, trachea midline. No masses, lumps or thyromegaly present.  ?Cardiovascular: Normal rate and rhythm. S1,S2 noted.  No murmur, rubs or gallops noted. No JVD or BLE edema.  ?Pulmonary/Chest: Normal effort and positive vesicular breath sounds. No respiratory distress. No wheezes, rales or ronchi noted.  ?Abdomen: Soft and nontender. Normal bowel sounds. No distention or masses noted. Liver, spleen and kidneys non palpable. ?Musculoskeletal: Strength 5/5 BUE/BLE. No difficulty with gait.  ?Neurological: Alert and oriented. Cranial nerves II-XII grossly intact. Coordination normal.  ?Psychiatric: Mood and affect normal. Behavior is normal. Judgment and thought content normal.  ? ? ?BMET ?   ?Component Value Date/Time  ? NA 140 05/26/2020 0856  ? K 4.4 05/26/2020 0856  ? CL 103 05/26/2020 0856  ? CO2 28 05/26/2020 0856  ? GLUCOSE 88 05/26/2020 0856  ? BUN 12 05/26/2020 0856  ? CREATININE 0.91 05/26/2020 0856  ? CALCIUM 9.5 05/26/2020 0856  ? GFRNONAA 114 05/26/2020 0856  ? GFRAA 132 05/26/2020 0856  ? ? ?Lipid Panel  ?No results found for: CHOL, TRIG, HDL, CHOLHDL, VLDL, LDLCALC ? ?CBC ?   ?Component Value Date/Time  ? WBC 5.1 05/26/2020 0856  ? RBC 5.05 05/26/2020 0856  ? HGB 15.8 05/26/2020 0856  ? HCT 46.2 05/26/2020 0856  ? PLT 188 05/26/2020 0856  ? MCV 91.5 05/26/2020 0856  ? MCH 31.3 05/26/2020 0856  ? MCHC 34.2 05/26/2020 0856  ? RDW 12.5 05/26/2020 0856  ? LYMPHSABS 1,408 05/26/2020 0856  ? EOSABS 31 05/26/2020 0856  ? BASOSABS 51 05/26/2020 0856  ? ? ?Hgb A1C ?No results found for: HGBA1C ? ? ? ? ? ?   ?Assessment & Plan:  ? ?Preventative Health Maintenance: ? ?Encouraged him to get a flu shot in fall ?Tetanus UTD ?Encouraged him to get his COVID-vaccine ?Encouraged him to consume a balanced diet and exercise  regimen ?Advised him to see a dentist annually ?We will check CBC, c-Met, lipid, A1c, HIV and hep C today ? ?RTC in 6 months, follow-up chronic conditions ? ?Webb Silversmith, NP ? ?

## 2022-03-25 ENCOUNTER — Telehealth: Payer: Self-pay

## 2022-03-25 NOTE — Telephone Encounter (Signed)
Copied from CRM [id number]. Topic: General - Other >> Mar 25, 2022 11:42 AM McGill, Darlina Rumpf wrote: Reason for CRM: Pt would like to know if lab orders requested by the PCP are for preventative care; this way, his insurance will pay for them.  Please advise.

## 2022-03-25 NOTE — Telephone Encounter (Signed)
Yes, cbc, cmet, lipid and Hep C

## 2022-03-25 NOTE — Telephone Encounter (Signed)
Pt advised.   Thanks,   -Jadyn Brasher  

## 2022-04-05 ENCOUNTER — Other Ambulatory Visit: Payer: Self-pay | Admitting: Family Medicine

## 2022-04-05 DIAGNOSIS — F5104 Psychophysiologic insomnia: Secondary | ICD-10-CM

## 2022-04-05 NOTE — Telephone Encounter (Signed)
Requested medication (s) are due for refill today: yes  Requested medication (s) are on the active medication list: yes  Last refill:  01/15/22 #10 with 0 RF  Future visit scheduled: 08/20/22, seen 02/18/22  Notes to clinic:  This medication can not be delegated, please assess.        Requested Prescriptions  Pending Prescriptions Disp Refills   clonazePAM (KLONOPIN) 1 MG tablet [Pharmacy Med Name: CLONAZEPAM 1 MG TAB] 10 tablet     Sig: TAKE 1 TABLET BY MOUTH ONCE DAILY AS NEEDED ANXIETY     Not Delegated - Psychiatry: Anxiolytics/Hypnotics 2 Failed - 04/05/2022  9:16 AM      Failed - This refill cannot be delegated      Failed - Urine Drug Screen completed in last 360 days      Passed - Patient is not pregnant      Passed - Valid encounter within last 6 months    Recent Outpatient Visits           1 month ago Encounter for general adult medical examination with abnormal findings   [facility] [place], Salvadore Oxford, NP   2 months ago Bipolar 2 disorder, major depressive episode [facility])   [facility] Smitty Cords, DO   8 months ago Need for immunization against influenza   [facility], Salvadore Oxford, NP   1 year ago Bipolar 2 disorder, major depressive episode (HCC)   [facility], Jodelle Gross, FNP   1 year ago Anxiety   [facility], Jodelle Gross, FNP       Future Appointments             In 4 months Baity, Salvadore Oxford, NP [facility], [facility]

## 2022-07-04 ENCOUNTER — Other Ambulatory Visit: Payer: Self-pay | Admitting: Internal Medicine

## 2022-07-04 DIAGNOSIS — F5104 Psychophysiologic insomnia: Secondary | ICD-10-CM

## 2022-07-05 NOTE — Telephone Encounter (Signed)
Requested medication (s) are due for refill today: yes  Requested medication (s) are on the active medication list: yes  Last refill:  04/05/22 310 with 0 RF  Future visit scheduled: 08/20/22, seen 02/18/22  Notes to clinic:  This medication can not be delegated, please assess.        Requested Prescriptions  Pending Prescriptions Disp Refills   clonazePAM (KLONOPIN) 1 MG tablet [Pharmacy Med Name: CLONAZEPAM 1 MG TAB] 10 tablet     Sig: TAKE 1 TABLET BY MOUTH ONCE DAILY AS NEEDED ANXIETY     Not Delegated - Psychiatry: Anxiolytics/Hypnotics 2 Failed - 07/04/2022  9:15 AM      Failed - This refill cannot be delegated      Failed - Urine Drug Screen completed in last 360 days      Passed - Patient is not pregnant      Passed - Valid encounter within last 6 months    Recent Outpatient Visits           4 months ago Encounter for general adult medical examination with abnormal findings   [facility] [place], Salvadore Oxford, NP   5 months ago Bipolar 2 disorder, major depressive episode [facility])   [facility] Smitty Cords, DO   11 months ago Need for immunization against influenza   [facility] [place], Salvadore Oxford, NP   1 year ago Bipolar 2 disorder, major depressive episode (HCC)   [facility], Jodelle Gross, FNP   2 years ago Anxiety   [facility], Jodelle Gross, FNP       Future Appointments             In 1 month Baity, Salvadore Oxford, NP [facility]

## 2022-08-20 ENCOUNTER — Ambulatory Visit: Payer: 59 | Admitting: Internal Medicine

## 2022-08-20 ENCOUNTER — Encounter: Payer: Self-pay | Admitting: Internal Medicine

## 2022-08-20 VITALS — BP 156/98 | HR 94 | Temp 96.4°F | Wt 189.0 lb

## 2022-08-20 DIAGNOSIS — Z6825 Body mass index (BMI) 25.0-25.9, adult: Secondary | ICD-10-CM

## 2022-08-20 DIAGNOSIS — F5104 Psychophysiologic insomnia: Secondary | ICD-10-CM | POA: Diagnosis not present

## 2022-08-20 DIAGNOSIS — Z23 Encounter for immunization: Secondary | ICD-10-CM | POA: Diagnosis not present

## 2022-08-20 DIAGNOSIS — S39012A Strain of muscle, fascia and tendon of lower back, initial encounter: Secondary | ICD-10-CM

## 2022-08-20 DIAGNOSIS — R69 Illness, unspecified: Secondary | ICD-10-CM | POA: Diagnosis not present

## 2022-08-20 DIAGNOSIS — E663 Overweight: Secondary | ICD-10-CM | POA: Diagnosis not present

## 2022-08-20 DIAGNOSIS — F411 Generalized anxiety disorder: Secondary | ICD-10-CM

## 2022-08-20 DIAGNOSIS — I10 Essential (primary) hypertension: Secondary | ICD-10-CM

## 2022-08-20 DIAGNOSIS — K580 Irritable bowel syndrome with diarrhea: Secondary | ICD-10-CM

## 2022-08-20 DIAGNOSIS — F410 Panic disorder [episodic paroxysmal anxiety] without agoraphobia: Secondary | ICD-10-CM

## 2022-08-20 DIAGNOSIS — F3181 Bipolar II disorder: Secondary | ICD-10-CM

## 2022-08-20 MED ORDER — CLONAZEPAM 1 MG PO TABS: ORAL_TABLET | ORAL | 0 refills | Status: DC

## 2022-08-20 MED ORDER — RISPERIDONE 3 MG PO TABS: 3.0000 mg | ORAL_TABLET | Freq: Every day | ORAL | 1 refills | Status: DC

## 2022-08-20 MED ORDER — LOSARTAN POTASSIUM 25 MG PO TABS: 25.0000 mg | ORAL_TABLET | Freq: Every day | ORAL | 0 refills | Status: DC

## 2022-08-20 NOTE — Assessment & Plan Note (Signed)
We will increase risperidone to 3 mg daily Continue clonazepam 1 mg daily as needed Support offered CSA today 

## 2022-08-20 NOTE — Patient Instructions (Signed)
Hypertension, Adult High blood pressure (hypertension) is when the force of blood pumping through the arteries is too strong. The arteries are the blood vessels that carry blood from the heart throughout the body. Hypertension forces the heart to work harder to pump blood and may cause arteries to become narrow or stiff. Untreated or uncontrolled hypertension can lead to a heart attack, heart failure, a stroke, kidney disease, and other problems. A blood pressure reading consists of a higher number over a lower number. Ideally, your blood pressure should be below 120/80. The first ("top") number is called the systolic pressure. It is a measure of the pressure in your arteries as your heart beats. The second ("bottom") number is called the diastolic pressure. It is a measure of the pressure in your arteries as the heart relaxes. What are the causes? The exact cause of this condition is not known. There are some conditions that result in high blood pressure. What increases the risk? Certain factors may make you more likely to develop high blood pressure. Some of these risk factors are under your control, including: Smoking. Not getting enough exercise or physical activity. Being overweight. Having too much fat, sugar, calories, or salt (sodium) in your diet. Drinking too much alcohol. Other risk factors include: Having a personal history of heart disease, diabetes, high cholesterol, or kidney disease. Stress. Having a family history of high blood pressure and high cholesterol. Having obstructive sleep apnea. Age. The risk increases with age. What are the signs or symptoms? High blood pressure may not cause symptoms. Very high blood pressure (hypertensive crisis) may cause: Headache. Fast or irregular heartbeats (palpitations). Shortness of breath. Nosebleed. Nausea and vomiting. Vision changes. Severe chest pain, dizziness, and seizures. How is this diagnosed? This condition is diagnosed by  measuring your blood pressure while you are seated, with your arm resting on a flat surface, your legs uncrossed, and your feet flat on the floor. The cuff of the blood pressure monitor will be placed directly against the skin of your upper arm at the level of your heart. Blood pressure should be measured at least twice using the same arm. Certain conditions can cause a difference in blood pressure between your right and left arms. If you have a high blood pressure reading during one visit or you have normal blood pressure with other risk factors, you may be asked to: Return on a different day to have your blood pressure checked again. Monitor your blood pressure at home for 1 week or longer. If you are diagnosed with hypertension, you may have other blood or imaging tests to help your health care provider understand your overall risk for other conditions. How is this treated? This condition is treated by making healthy lifestyle changes, such as eating healthy foods, exercising more, and reducing your alcohol intake. You may be referred for counseling on a healthy diet and physical activity. Your health care provider may prescribe medicine if lifestyle changes are not enough to get your blood pressure under control and if: Your systolic blood pressure is above 130. Your diastolic blood pressure is above 80. Your personal target blood pressure may vary depending on your medical conditions, your age, and other factors. Follow these instructions at home: Eating and drinking  Eat a diet that is high in fiber and potassium, and low in sodium, added sugar, and fat. An example of this eating plan is called the DASH diet. DASH stands for Dietary Approaches to Stop Hypertension. To eat this way: Eat   plenty of fresh fruits and vegetables. Try to fill one half of your plate at each meal with fruits and vegetables. Eat whole grains, such as whole-wheat pasta, brown rice, or whole-grain bread. Fill about one  fourth of your plate with whole grains. Eat or drink low-fat dairy products, such as skim milk or low-fat yogurt. Avoid fatty cuts of meat, processed or cured meats, and poultry with skin. Fill about one fourth of your plate with lean proteins, such as fish, chicken without skin, beans, eggs, or tofu. Avoid pre-made and processed foods. These tend to be higher in sodium, added sugar, and fat. Reduce your daily sodium intake. Many people with hypertension should eat less than 1,500 mg of sodium a day. Do not drink alcohol if: Your health care provider tells you not to drink. You are pregnant, may be pregnant, or are planning to become pregnant. If you drink alcohol: Limit how much you have to: 0-1 drink a day for women. 0-2 drinks a day for men. Know how much alcohol is in your drink. In the [place] one drink equals one 12 oz bottle of beer (355 mL), one 5 oz glass of wine (148 mL), or one 1 oz glass of hard liquor (44 mL). Lifestyle  Work with your health care provider to maintain a healthy body weight or to lose weight. Ask what an ideal weight is for you. Get at least 30 minutes of exercise that causes your heart to beat faster (aerobic exercise) most days of the week. Activities may include walking, swimming, or biking. Include exercise to strengthen your muscles (resistance exercise), such as Pilates or lifting weights, as part of your weekly exercise routine. Try to do these types of exercises for 30 minutes at least 3 days a week. Do not use any products that contain nicotine or tobacco. These products include cigarettes, chewing tobacco, and vaping devices, such as e-cigarettes. If you need help quitting, ask your health care provider. Monitor your blood pressure at home as told by your health care provider. Keep all follow-up visits. This is important. Medicines Take over-the-counter and prescription medicines only as told by your health care provider. Follow directions carefully. Blood  pressure medicines must be taken as prescribed. Do not skip doses of blood pressure medicine. Doing this puts you at risk for problems and can make the medicine less effective. Ask your health care provider about side effects or reactions to medicines that you should watch for. Contact a health care provider if you: Think you are having a reaction to a medicine you are taking. Have headaches that keep coming back (recurring). Feel dizzy. Have swelling in your ankles. Have trouble with your vision. Get help right away if you: Develop a severe headache or confusion. Have unusual weakness or numbness. Feel faint. Have severe pain in your chest or abdomen. Vomit repeatedly. Have trouble breathing. These symptoms may be an emergency. Get help right away. Call 911. Do not wait to see if the symptoms will go away. Do not drive yourself to the hospital. Summary Hypertension is when the force of blood pumping through your arteries is too strong. If this condition is not controlled, it may put you at risk for serious complications. Your personal target blood pressure may vary depending on your medical conditions, your age, and other factors. For most people, a normal blood pressure is less than 120/80. Hypertension is treated with lifestyle changes, medicines, or a combination of both. Lifestyle changes include losing weight, eating a healthy,   low-sodium diet, exercising more, and limiting alcohol. This information is not intended to replace advice given to you by your health care provider. Make sure you discuss any questions you have with your health care provider. Document Revised: 08/14/2021 Document Reviewed: 08/14/2021 Elsevier Patient Education  2023 Elsevier Inc.  

## 2022-08-20 NOTE — Assessment & Plan Note (Signed)
We will start losartan 25 mg daily Reinforced DASH diet and exercise for weight loss

## 2022-08-20 NOTE — Assessment & Plan Note (Signed)
We will increase risperidone to 3 mg daily Continue clonazepam 1 mg daily as needed Support offered CSA today

## 2022-08-20 NOTE — Progress Notes (Signed)
Subjective:    Patient ID: Dominic Barnes, male    DOB: Jan 21, 1991, 31 y.o.   MRN: [id number]  HPI  Patient presents to clinic today for follow-up of chronic conditions.  IBS: Mainly diarrhea. He does not take any medications for this. Colonoscopy from 10/2017 reviewed.  Anxiety, Panic Attacks and Depression: Chronic, managed with Clonazepam and Risperidone.  He is not currently seeing a therapist or psychiatrist.  He denies SI/HI.  Insomnia: He has difficulty staying.  He is taking Risperidone as prescribed.  There is no sleep study on file.  Of note, his BP today is 148/96.  He is not taking any antihypertensive medications at this time.  He also reports left-sided low back pain.  This started about a week ago.  He describes the pain as tight, pulling with muscle spasms.  The pain does not radiate.  The pain is worse with movement.  He did see the chiropractor yesterday for an adjustment which seemed to help.  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 1 episiode. Approx age 73.   Headache    sinus   Sinus trouble    Testicle pain     Current Outpatient Medications  Medication Sig Dispense Refill   clonazePAM (KLONOPIN) 1 MG tablet TAKE 1 TABLET BY MOUTH ONCE DAILY AS NEEDED ANXIETY 10 tablet 0   risperiDONE (RISPERDAL) 2 MG tablet Take 1 tablet (2 mg total) by mouth at bedtime. 90 tablet 1   No current facility-administered medications for this visit.    No Known Allergies  Family History  Problem Relation Age of Onset   Cirrhosis Father    Anxiety disorder Father    Depression Father    Alcohol abuse Father    ADD / ADHD Brother    Kidney disease Neg Hx    Prostate cancer Neg Hx     Social History   Socioeconomic History   Marital status: Single    Spouse name: Not on file   Number of children: 0   Years of education: Not on file   Highest education  level: Some college, no degree  Occupational History   Not on file  Tobacco Use   Smoking status: Former    Types: Cigarettes    Quit date: 07/21/2012    Years since quitting: 10.0   Smokeless tobacco: Former   Tobacco comments:    quit 5 years /occasional dip  Vaping Use   Vaping Use: Never used  Substance and Sexual Activity   Alcohol use: Yes    Alcohol/week: 7.0 standard drinks of alcohol    Types: 7 Cans of beer per week    Comment: socially   Drug use: Yes    Types: Marijuana    Comment: last used 2 weeks    Sexual activity: Not Currently  Other Topics Concern   Not on file  Social History Narrative   Not on file   Social Determinants of Health   Financial Resource Strain: Low Risk  (10/29/2018)   Overall Financial Resource Strain (CARDIA)    Difficulty of Paying Living Expenses: Not very hard  Food Insecurity: No Food Insecurity (10/29/2018)   Hunger Vital Sign    Worried About Running Out of Food in the Last Year: Never true    Ran Out of Food in the Last Year:  Never true  Transportation Needs: No Transportation Needs (10/29/2018)   PRAPARE - Administrator, Civil Service (Medical): No    Lack of Transportation (Non-Medical): No  Physical Activity: Inactive (10/29/2018)   Exercise Vital Sign    Days of Exercise per Week: 0 days    Minutes of Exercise per Session: 0 min  Stress: Not on file  Social Connections: Unknown (10/29/2018)   Social Connection and Isolation Panel [NHANES]    Frequency of Communication with Friends and Family: Not on file    Frequency of Social Gatherings with Friends and Family: Not on file    Attends Religious Services: Never    Database administrator or Organizations: No    Attends Banker Meetings: Never    Marital Status: Never married  Intimate Partner Violence: Not At Risk (10/29/2018)   Humiliation, Afraid, Rape, and Kick questionnaire    Fear of Current or Ex-Partner: No    Emotionally Abused: No    Physically  Abused: No    Sexually Abused: No     Constitutional: Denies fever, malaise, fatigue, headache or abrupt weight changes.  HEENT: Denies eye pain, eye redness, ear pain, ringing in the ears, wax buildup, runny nose, nasal congestion, bloody nose, or sore throat. Respiratory: Denies difficulty breathing, shortness of breath, cough or sputum production.   Cardiovascular: Denies chest pain, chest tightness, palpitations or swelling in the hands or feet.  Gastrointestinal: Patient reports intermittent diarrhea.  Denies abdominal pain, bloating, constipation,  or blood in the stool.  GU: Denies urgency, frequency, pain with urination, burning sensation, blood in urine, odor or discharge. Musculoskeletal: Patient reports left-sided low back pain.  Denies decrease in range of motion, difficulty with gait, or joint pain and swelling.  Skin: Denies redness, rashes, lesions or ulcercations.  Neurological: Patient reports insomnia.  Denies dizziness, difficulty with memory, difficulty with speech or problems with balance and coordination.  Psych: Patient has a history of anxiety and depression.  Denies SI/HI.  No other specific complaints in a complete review of systems (except as listed in HPI above).  Objective:   Physical Exam  BP (!) 148/96 (BP Location: Right Arm, Patient Position: Sitting, Cuff Size: Normal)   Pulse 94   Temp (!) 96.4 F (35.8 C) (Temporal)   Wt 189 lb (85.7 kg)   SpO2 99%   BMI 25.63 kg/m  Wt Readings from Last 3 Encounters:  08/20/22 189 lb (85.7 kg)  02/18/22 190 lb (86.2 kg)  01/15/22 188 lb 9.6 oz (85.5 kg)    General: Appears their stated age, overweight, in NAD. Skin: Warm, dry and intact.  HEENT: Head: normal shape and size; Eyes: sclera white, no icterus, conjunctiva pink, PERRLA and EOMs intact;  Cardiovascular: Normal rate and rhythm. S1,S2 noted.  No murmur, rubs or gallops noted. No JVD or BLE edema. No carotid bruits noted. Pulmonary/Chest: Normal  effort and positive vesicular breath sounds. No respiratory distress. No wheezes, rales or ronchi noted.  Abdomen: Soft and nontender. Normal bowel sounds.  Musculoskeletal: Normal flexion, extension and rotation of the spine.  No bony tenderness noted over the lumbar spine.  Pain with palpation of the left paralumbar muscles.  Strength 5/5 BLE.  No difficulty with gait.  Neurological: Alert and oriented. Cranial nerves II-XII grossly intact. Coordination normal.  Psychiatric: Mood and affect mild flat. Behavior is normal. Judgment and thought content normal.     BMET    Component Value Date/Time   NA 140  05/26/2020 0856   K 4.4 05/26/2020 0856   CL 103 05/26/2020 0856   CO2 28 05/26/2020 0856   GLUCOSE 88 05/26/2020 0856   BUN 12 05/26/2020 0856   CREATININE 0.91 05/26/2020 0856   CALCIUM 9.5 05/26/2020 0856   GFRNONAA 114 05/26/2020 0856   GFRAA 132 05/26/2020 0856    Lipid Panel  No results found for: "CHOL", "TRIG", "HDL", "CHOLHDL", "VLDL", "LDLCALC"  CBC    Component Value Date/Time   WBC 5.1 05/26/2020 0856   RBC 5.05 05/26/2020 0856   HGB 15.8 05/26/2020 0856   HCT 46.2 05/26/2020 0856   PLT 188 05/26/2020 0856   MCV 91.5 05/26/2020 0856   MCH 31.3 05/26/2020 0856   MCHC 34.2 05/26/2020 0856   RDW 12.5 05/26/2020 0856   LYMPHSABS 1,408 05/26/2020 0856   EOSABS 31 05/26/2020 0856   BASOSABS 51 05/26/2020 0856    Hgb A1C No results found for: "HGBA1C"          Assessment & Plan:   Muscle Strain of Back:  Encourage regular stretching Use of a heating pad may be beneficial Continue chiropractic care as needed  RTC in 2 weeks for follow-up HTN Dominic Silversmith, Dominic Barnes

## 2022-08-20 NOTE — Assessment & Plan Note (Signed)
Avoid foods that trigger your IBS

## 2022-08-20 NOTE — Assessment & Plan Note (Signed)
Encourage diet and exercise for weight loss 

## 2022-09-03 ENCOUNTER — Encounter: Payer: Self-pay | Admitting: Internal Medicine

## 2022-09-03 ENCOUNTER — Ambulatory Visit: Payer: 59 | Admitting: Internal Medicine

## 2022-09-03 VITALS — BP 137/92 | HR 86 | Temp 96.9°F | Wt 190.0 lb

## 2022-09-03 DIAGNOSIS — Z6825 Body mass index (BMI) 25.0-25.9, adult: Secondary | ICD-10-CM | POA: Diagnosis not present

## 2022-09-03 DIAGNOSIS — I10 Essential (primary) hypertension: Secondary | ICD-10-CM

## 2022-09-03 DIAGNOSIS — E663 Overweight: Secondary | ICD-10-CM

## 2022-09-03 MED ORDER — LOSARTAN POTASSIUM 25 MG PO TABS: 50.0000 mg | ORAL_TABLET | Freq: Every day | ORAL | 0 refills | Status: DC

## 2022-09-03 NOTE — Progress Notes (Signed)
Subjective:    Patient ID: Dominic Barnes, male    DOB: June 22, 1991, 31 y.o.   MRN: [id number]  HPI  Patient presents to clinic today for 2-week follow-up of HTN.  At his last visit, he was started on Losartan.  He has been taking the medication as prescribed.  His BP today is 137/92.  ECG from 10/2018 reviewed.  He has cut back on his sodium and stop drinking energy drinks.  He does feel like his chronic anxiety and stress levels are contributing to his elevated blood pressure.  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 1 episiode. Approx age 34.   Headache    sinus   Sinus trouble    Testicle pain     Current Outpatient Medications  Medication Sig Dispense Refill   clonazePAM (KLONOPIN) 1 MG tablet TAKE 1 TABLET BY MOUTH ONCE DAILY AS NEEDED ANXIETY 10 tablet 0   losartan (COZAAR) 25 MG tablet Take 1 tablet (25 mg total) by mouth daily. 30 tablet 0   risperiDONE (RISPERDAL) 3 MG tablet Take 1 tablet (3 mg total) by mouth at bedtime. 90 tablet 1   No current facility-administered medications for this visit.    No Known Allergies  Family History  Problem Relation Age of Onset   Cirrhosis Father    Anxiety disorder Father    Depression Father    Alcohol abuse Father    ADD / ADHD Brother    Kidney disease Neg Hx    Prostate cancer Neg Hx     Social History   Socioeconomic History   Marital status: Single    Spouse name: Not on file   Number of children: 0   Years of education: Not on file   Highest education level: Some college, no degree  Occupational History   Not on file  Tobacco Use   Smoking status: Former    Types: Cigarettes    Quit date: 07/21/2012    Years since quitting: 10.1   Smokeless tobacco: Former   Tobacco comments:    quit 5 years /occasional dip  Vaping Use   Vaping Use: Never used  Substance and Sexual Activity   Alcohol use: Yes     Alcohol/week: 7.0 standard drinks of alcohol    Types: 7 Cans of beer per week    Comment: socially   Drug use: Yes    Types: Marijuana    Comment: last used 2 weeks    Sexual activity: Not Currently  Other Topics Concern   Not on file  Social History Narrative   Not on file   Social Determinants of Health   Financial Resource Strain: Low Risk  (10/29/2018)   Overall Financial Resource Strain (CARDIA)    Difficulty of Paying Living Expenses: Not very hard  Food Insecurity: No Food Insecurity (10/29/2018)   Hunger Vital Sign    Worried About Running Out of Food in the Last Year: Never true    Ran Out of Food in the Last Year: Never true  Transportation Needs: No Transportation Needs (10/29/2018)   PRAPARE - Administrator, Civil Service (Medical): No    Lack of Transportation (Non-Medical): No  Physical Activity: Inactive (10/29/2018)   Exercise Vital Sign    Days of Exercise per Week: 0 days    Minutes of  Exercise per Session: 0 min  Stress: Not on file  Social Connections: Unknown (10/29/2018)   Social Connection and Isolation Panel [NHANES]    Frequency of Communication with Friends and Family: Not on file    Frequency of Social Gatherings with Friends and Family: Not on file    Attends Religious Services: Never    Database administrator or Organizations: No    Attends Banker Meetings: Never    Marital Status: Never married  Intimate Partner Violence: Not At Risk (10/29/2018)   Humiliation, Afraid, Rape, and Kick questionnaire    Fear of Current or Ex-Partner: No    Emotionally Abused: No    Physically Abused: No    Sexually Abused: No     Constitutional: Denies fever, malaise, fatigue, headache or abrupt weight changes.  Respiratory: Denies difficulty breathing, shortness of breath, cough or sputum production.   Cardiovascular: Denies chest pain, chest tightness, palpitations or swelling in the hands or feet.  Neurological: Denies dizziness,  difficulty with memory, difficulty with speech or problems   No other specific complaints in a complete review of systems (except as listed in HPI above).  Objective:   Physical Exam  BP (!) 137/92 (BP Location: Left Arm, Patient Position: Sitting, Cuff Size: Normal)   Pulse 86   Temp (!) 96.9 F (36.1 C) (Temporal)   Wt 190 lb (86.2 kg)   SpO2 100%   BMI 25.77 kg/m   Wt Readings from Last 3 Encounters:  08/20/22 189 lb (85.7 kg)  02/18/22 190 lb (86.2 kg)  01/15/22 188 lb 9.6 oz (85.5 kg)    General: Appears his stated age, well developed, well nourished in NAD. Cardiovascular: Normal rate and rhythm. S1,S2 noted.  No murmur, rubs or gallops noted.  Pulmonary/Chest: Normal effort and positive vesicular breath sounds. No respiratory distress. No wheezes, rales or ronchi noted.  Neurological: Alert and oriented.     BMET    Component Value Date/Time   NA 140 05/26/2020 0856   K 4.4 05/26/2020 0856   CL 103 05/26/2020 0856   CO2 28 05/26/2020 0856   GLUCOSE 88 05/26/2020 0856   BUN 12 05/26/2020 0856   CREATININE 0.91 05/26/2020 0856   CALCIUM 9.5 05/26/2020 0856   GFRNONAA 114 05/26/2020 0856   GFRAA 132 05/26/2020 0856    Lipid Panel  No results found for: "CHOL", "TRIG", "HDL", "CHOLHDL", "VLDL", "LDLCALC"  CBC    Component Value Date/Time   WBC 5.1 05/26/2020 0856   RBC 5.05 05/26/2020 0856   HGB 15.8 05/26/2020 0856   HCT 46.2 05/26/2020 0856   PLT 188 05/26/2020 0856   MCV 91.5 05/26/2020 0856   MCH 31.3 05/26/2020 0856   MCHC 34.2 05/26/2020 0856   RDW 12.5 05/26/2020 0856   LYMPHSABS 1,408 05/26/2020 0856   EOSABS 31 05/26/2020 0856   BASOSABS 51 05/26/2020 0856    Hgb A1C No results found for: "HGBA1C"        Assessment & Plan:     RTC in 6 months for your annual exam Nicki Reaper, NP

## 2022-09-03 NOTE — Assessment & Plan Note (Signed)
Encourage diet and exercise for weight loss 

## 2022-09-03 NOTE — Assessment & Plan Note (Signed)
Improved but not at goal Increase losartan to 50 mg daily Reinforced DASH diet and exercise for weight loss

## 2022-09-03 NOTE — Patient Instructions (Signed)
Hypertension, Adult ?Hypertension is another name for high blood pressure. High blood pressure forces your heart to work harder to pump blood. This can cause problems over time. ?There are two numbers in a blood pressure reading. There is a top number (systolic) over a bottom number (diastolic). It is best to have a blood pressure that is below 120/80. ?What are the causes? ?The cause of this condition is not known. Some other conditions can lead to high blood pressure. ?What increases the risk? ?Some lifestyle factors can make you more likely to develop high blood pressure: ?Smoking. ?Not getting enough exercise or physical activity. ?Being overweight. ?Having too much fat, sugar, calories, or salt (sodium) in your diet. ?Drinking too much alcohol. ?Other risk factors include: ?Having any of these conditions: ?Heart disease. ?Diabetes. ?High cholesterol. ?Kidney disease. ?Obstructive sleep apnea. ?Having a family history of high blood pressure and high cholesterol. ?Age. The risk increases with age. ?Stress. ?What are the signs or symptoms? ?High blood pressure may not cause symptoms. Very high blood pressure (hypertensive crisis) may cause: ?Headache. ?Fast or uneven heartbeats (palpitations). ?Shortness of breath. ?Nosebleed. ?Vomiting or feeling like you may vomit (nauseous). ?Changes in how you see. ?Very bad chest pain. ?Feeling dizzy. ?Seizures. ?How is this treated? ?This condition is treated by making healthy lifestyle changes, such as: ?Eating healthy foods. ?Exercising more. ?Drinking less alcohol. ?Your doctor may prescribe medicine if lifestyle changes do not help enough and if: ?Your top number is above 130. ?Your bottom number is above 80. ?Your personal target blood pressure may vary. ?Follow these instructions at home: ?Eating and drinking ? ?If told, follow the DASH eating plan. To follow this plan: ?Fill one half of your plate at each meal with fruits and vegetables. ?Fill one fourth of your plate  at each meal with whole grains. Whole grains include whole-wheat pasta, brown rice, and whole-grain bread. ?Eat or drink low-fat dairy products, such as skim milk or low-fat yogurt. ?Fill one fourth of your plate at each meal with low-fat (lean) proteins. Low-fat proteins include fish, chicken without skin, eggs, beans, and tofu. ?Avoid fatty meat, cured and processed meat, or chicken with skin. ?Avoid pre-made or processed food. ?Limit the amount of salt in your diet to less than 1,500 mg each day. ?Do not drink alcohol if: ?Your doctor tells you not to drink. ?You are pregnant, may be pregnant, or are planning to become pregnant. ?If you drink alcohol: ?Limit how much you have to: ?0-1 drink a day for women. ?0-2 drinks a day for men. ?Know how much alcohol is in your drink. In the [place] one drink equals one 12 oz bottle of beer (355 mL), one 5 oz glass of wine (148 mL), or one 1? oz glass of hard liquor (44 mL). ?Lifestyle ? ?Work with your doctor to stay at a healthy weight or to lose weight. Ask your doctor what the best weight is for you. ?Get at least 30 minutes of exercise that causes your heart to beat faster (aerobic exercise) most days of the week. This may include walking, swimming, or biking. ?Get at least 30 minutes of exercise that strengthens your muscles (resistance exercise) at least 3 days a week. This may include lifting weights or doing Pilates. ?Do not smoke or use any products that contain nicotine or tobacco. If you need help quitting, ask your doctor. ?Check your blood pressure at home as told by your doctor. ?Keep all follow-up visits. ?Medicines ?Take over-the-counter and prescription medicines   only as told by your doctor. Follow directions carefully. ?Do not skip doses of blood pressure medicine. The medicine does not work as well if you skip doses. Skipping doses also puts you at risk for problems. ?Ask your doctor about side effects or reactions to medicines that you should watch  for. ?Contact a doctor if: ?You think you are having a reaction to the medicine you are taking. ?You have headaches that keep coming back. ?You feel dizzy. ?You have swelling in your ankles. ?You have trouble with your vision. ?Get help right away if: ?You get a very bad headache. ?You start to feel mixed up (confused). ?You feel weak or numb. ?You feel faint. ?You have very bad pain in your: ?Chest. ?Belly (abdomen). ?You vomit more than once. ?You have trouble breathing. ?These symptoms may be an emergency. Get help right away. Call 911. ?Do not wait to see if the symptoms will go away. ?Do not drive yourself to the hospital. ?Summary ?Hypertension is another name for high blood pressure. ?High blood pressure forces your heart to work harder to pump blood. ?For most people, a normal blood pressure is less than 120/80. ?Making healthy choices can help lower blood pressure. If your blood pressure does not get lower with healthy choices, you may need to take medicine. ?This information is not intended to replace advice given to you by your health care provider. Make sure you discuss any questions you have with your health care provider. ?Document Revised: 07/26/2021 Document Reviewed: 07/26/2021 ?Elsevier Patient Education ? 2023 [employer] ? ?

## 2022-09-04 ENCOUNTER — Encounter: Payer: Self-pay | Admitting: Internal Medicine

## 2022-09-17 ENCOUNTER — Ambulatory Visit (INDEPENDENT_AMBULATORY_CARE_PROVIDER_SITE_OTHER): Payer: 59

## 2022-09-17 VITALS — BP 131/91

## 2022-09-17 DIAGNOSIS — I10 Essential (primary) hypertension: Secondary | ICD-10-CM

## 2022-09-17 NOTE — Progress Notes (Signed)
Hypertension, follow-up  BP Readings from Last 3 Encounters:  09/03/22 (!) 137/92  08/20/22 (!) 156/98  02/18/22 134/82   Wt Readings from Last 3 Encounters:  09/03/22 190 lb (86.2 kg)  08/20/22 189 lb (85.7 kg)  02/18/22 190 lb (86.2 kg)     He was last seen for hypertension 09/03/2022 BP at that visit was 137/92. Management since that visit includes increasing losartan to 50mg  daily.  He reports poor compliance with treatment. He is having side effects.    Today's BP  131/92 right arm  131/91 left arm     Phone number:   [phone]

## 2022-10-01 NOTE — Progress Notes (Signed)
BP improved but not at goal. Would he be willing to go up to 75 mg daily?

## 2022-10-09 ENCOUNTER — Encounter: Payer: Self-pay | Admitting: Internal Medicine

## 2022-10-10 ENCOUNTER — Encounter: Payer: Self-pay | Admitting: Internal Medicine

## 2022-10-10 ENCOUNTER — Ambulatory Visit: Payer: 59 | Admitting: Internal Medicine

## 2022-10-10 VITALS — BP 145/96 | HR 91 | Temp 96.9°F | Wt 193.0 lb

## 2022-10-10 DIAGNOSIS — F3181 Bipolar II disorder: Secondary | ICD-10-CM | POA: Diagnosis not present

## 2022-10-10 DIAGNOSIS — F5104 Psychophysiologic insomnia: Secondary | ICD-10-CM | POA: Diagnosis not present

## 2022-10-10 DIAGNOSIS — I10 Essential (primary) hypertension: Secondary | ICD-10-CM

## 2022-10-10 DIAGNOSIS — E663 Overweight: Secondary | ICD-10-CM | POA: Diagnosis not present

## 2022-10-10 DIAGNOSIS — N522 Drug-induced erectile dysfunction: Secondary | ICD-10-CM | POA: Diagnosis not present

## 2022-10-10 DIAGNOSIS — Z6826 Body mass index (BMI) 26.0-26.9, adult: Secondary | ICD-10-CM

## 2022-10-10 DIAGNOSIS — R69 Illness, unspecified: Secondary | ICD-10-CM | POA: Diagnosis not present

## 2022-10-10 DIAGNOSIS — F411 Generalized anxiety disorder: Secondary | ICD-10-CM | POA: Diagnosis not present

## 2022-10-10 DIAGNOSIS — F410 Panic disorder [episodic paroxysmal anxiety] without agoraphobia: Secondary | ICD-10-CM

## 2022-10-10 DIAGNOSIS — N529 Male erectile dysfunction, unspecified: Secondary | ICD-10-CM | POA: Insufficient documentation

## 2022-10-10 MED ORDER — LOSARTAN POTASSIUM 50 MG PO TABS: 50.0000 mg | ORAL_TABLET | Freq: Every day | ORAL | 1 refills | Status: DC

## 2022-10-10 MED ORDER — RISPERIDONE 1 MG PO TABS: 1.0000 mg | ORAL_TABLET | Freq: Every day | ORAL | 1 refills | Status: DC

## 2022-10-10 MED ORDER — SILDENAFIL CITRATE 100 MG PO TABS: 50.0000 mg | ORAL_TABLET | Freq: Every day | ORAL | 5 refills | Status: DC | PRN

## 2022-10-10 NOTE — Assessment & Plan Note (Signed)
Uncontrolled off meds Losartan 50 mg refilled today Reinforced DASH diet and exercise for weight loss

## 2022-10-10 NOTE — Assessment & Plan Note (Signed)
Will trial sildenafil 50 to 100 mg daily

## 2022-10-10 NOTE — Progress Notes (Signed)
Subjective:    Patient ID: Dominic Barnes, male    DOB: [birth year], 30 y.o.   MRN: [id number]  HPI  Patient presents to the clinic today with complaint of decrease libido, erectile dysfunction.  He reports this started 2 years ago. He reports his erections are not as full.  He does not have any issues ejaculating.  He feels like this is being caused by his Risperidone.  He has tried decreasing the dose to see if this would help.  He has never taken any medication for this.  Of note, his BP 140/99.  He reports he has been out of his Losartan for 1 week.  He would like this refilled today.  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 1 episiode. Approx age 68.   Headache    sinus   Sinus trouble    Testicle pain     Current Outpatient Medications  Medication Sig Dispense Refill   clonazePAM (KLONOPIN) 1 MG tablet TAKE 1 TABLET BY MOUTH ONCE DAILY AS NEEDED ANXIETY 10 tablet 0   losartan (COZAAR) 25 MG tablet Take 2 tablets (50 mg total) by mouth daily. 30 tablet 0   risperiDONE (RISPERDAL) 3 MG tablet Take 1 tablet (3 mg total) by mouth at bedtime. 90 tablet 1   No current facility-administered medications for this visit.    No Known Allergies  Family History  Problem Relation Age of Onset   Cirrhosis Father    Anxiety disorder Father    Depression Father    Alcohol abuse Father    ADD / ADHD Brother    Kidney disease Neg Hx    Prostate cancer Neg Hx     Social History   Socioeconomic History   Marital status: Single    Spouse name: Not on file   Number of children: 0   Years of education: Not on file   Highest education level: Some college, no degree  Occupational History   Not on file  Tobacco Use   Smoking status: Former    Types: Cigarettes    Quit date: 07/21/2012    Years since quitting: 10.2   Smokeless tobacco: Former   Tobacco comments:    quit 5  years /occasional dip  Vaping Use   Vaping Use: Never used  Substance and Sexual Activity   Alcohol use: Yes    Alcohol/week: 7.0 standard drinks of alcohol    Types: 7 Cans of beer per week    Comment: socially   Drug use: Yes    Types: Marijuana    Comment: last used 2 weeks    Sexual activity: Not Currently  Other Topics Concern   Not on file  Social History Narrative   Not on file   Social Determinants of Health   Financial Resource Strain: Low Risk  (10/29/2018)   Overall Financial Resource Strain (CARDIA)    Difficulty of Paying Living Expenses: Not very hard  Food Insecurity: No Food Insecurity (10/29/2018)   Hunger Vital Sign    Worried About Running Out of Food in the Last Year: Never true    Ran Out of Food in the Last Year: Never true  Transportation Needs: No Transportation Needs (10/29/2018)   PRAPARE - Administrator, Civil Service (Medical): No    Lack of Transportation (Non-Medical): No  Physical Activity: Inactive (10/29/2018)   Exercise Vital Sign    Days of Exercise per Week: 0 days    Minutes of Exercise per Session: 0 min  Stress: Not on file  Social Connections: Unknown (10/29/2018)   Social Connection and Isolation Panel [NHANES]    Frequency of Communication with Friends and Family: Not on file    Frequency of Social Gatherings with Friends and Family: Not on file    Attends Religious Services: Never    Database administrator or Organizations: No    Attends Banker Meetings: Never    Marital Status: Never married  Intimate Partner Violence: Not At Risk (10/29/2018)   Humiliation, Afraid, Rape, and Kick questionnaire    Fear of Current or Ex-Partner: No    Emotionally Abused: No    Physically Abused: No    Sexually Abused: No     Constitutional: Denies fever, malaise, fatigue, headache or abrupt weight changes.  Respiratory: Denies difficulty breathing, shortness of breath, cough or sputum production.   Cardiovascular: Denies  chest pain, chest tightness, palpitations or swelling in the hands or feet.  Gastrointestinal: Denies abdominal pain, bloating, constipation, diarrhea or blood in the stool.  GU: Patient reports erectile dysfunction.  Denies urgency, frequency, pain with urination, burning sensation, blood in urine, odor or discharge. Neurological: Denies dizziness, difficulty with memory, difficulty with speech or problems with balance and coordination.    No other specific complaints in a complete review of systems (except as listed in HPI above).  Objective:   Physical Exam  BP (!) 145/96 (BP Location: Left Arm, Patient Position: Sitting, Cuff Size: Normal)   Pulse 91   Temp (!) 96.9 F (36.1 C) (Temporal)   Wt 193 lb (87.5 kg)   SpO2 100%   BMI 26.18 kg/m   Wt Readings from Last 3 Encounters:  09/03/22 190 lb (86.2 kg)  08/20/22 189 lb (85.7 kg)  02/18/22 190 lb (86.2 kg)    General: Appears his stated age, overweight in NAD. Cardiovascular: Normal rate and rhythm. S1,S2 noted.  No murmur, rubs or gallops noted.  Pulmonary/Chest: Normal effort and positive vesicular breath sounds. No respiratory distress. No wheezes, rales or ronchi noted.  Musculoskeletal:  No difficulty with gait.  Neurological: Alert and oriented. normal.    BMET    Component Value Date/Time   NA 140 05/26/2020 0856   K 4.4 05/26/2020 0856   CL 103 05/26/2020 0856   CO2 28 05/26/2020 0856   GLUCOSE 88 05/26/2020 0856   BUN 12 05/26/2020 0856   CREATININE 0.91 05/26/2020 0856   CALCIUM 9.5 05/26/2020 0856   GFRNONAA 114 05/26/2020 0856   GFRAA 132 05/26/2020 0856    Lipid Panel  No results found for: "CHOL", "TRIG", "HDL", "CHOLHDL", "VLDL", "LDLCALC"  CBC    Component Value Date/Time   WBC 5.1 05/26/2020 0856   RBC 5.05 05/26/2020 0856   HGB 15.8 05/26/2020 0856   HCT 46.2 05/26/2020 0856   PLT 188 05/26/2020 0856   MCV 91.5 05/26/2020 0856   MCH 31.3 05/26/2020 0856   MCHC 34.2 05/26/2020 0856    RDW 12.5 05/26/2020 0856   LYMPHSABS 1,408 05/26/2020 0856   EOSABS 31 05/26/2020 0856   BASOSABS 51 05/26/2020 0856    Hgb A1C No results found for: "HGBA1C"          Assessment & Plan:     RTC in 5 months for your annual exam Nicki Reaper, NP

## 2022-10-10 NOTE — Assessment & Plan Note (Signed)
Encourage diet and exercise for weight loss 

## 2022-10-10 NOTE — Patient Instructions (Signed)
Erectile Dysfunction ?Erectile dysfunction (ED) is the inability to get or keep an erection in order to have sexual intercourse. ED is considered a symptom of an underlying disorder and is not considered a disease. ED may include: ?Inability to get an erection. ?Lack of enough hardness of the erection to allow penetration. ?Loss of erection before sex is finished. ?What are the causes? ?This condition may be caused by: ?Physical causes, such as: ?Artery problems. This may include heart disease, high blood pressure, atherosclerosis, and diabetes. ?Hormonal problems, such as low testosterone. ?Obesity. ?Nerve problems. This may include back or pelvic injuries, multiple sclerosis, Parkinson's disease, spinal cord injury, and stroke. ?Certain medicines, such as: ?Pain relievers. ?Antidepressants. ?Blood pressure medicines and water pills (diuretics). ?Cancer medicines. ?Antihistamines. ?Muscle relaxants. ?Lifestyle factors, such as: ?Use of drugs such as marijuana, cocaine, or opioids. ?Excessive use of alcohol. ?Smoking. ?Lack of physical activity or exercise. ?Psychological causes, such as: ?Anxiety or stress. ?Sadness or depression. ?Exhaustion. ?Fear about sexual performance. ?Guilt. ?What are the signs or symptoms? ?Symptoms of this condition include: ?Inability to get an erection. ?Lack of enough hardness of the erection to allow penetration. ?Loss of the erection before sex is finished. ?Sometimes having normal erections, but with frequent unsatisfactory episodes. ?Low sexual satisfaction in either partner due to erection problems. ?A curved penis occurring with erection. The curve may cause pain, or the penis may be too curved to allow for intercourse. ?Never having nighttime or morning erections. ?How is this diagnosed? ?This condition is often diagnosed by: ?Performing a physical exam to find other diseases or specific problems with the penis. ?Asking you detailed questions about the problem. ?Doing tests,  such as: ?Blood tests to check for diabetes mellitus or high cholesterol, or to measure hormone levels. ?Other tests to check for underlying health conditions. ?An ultrasound exam to check for scarring. ?A test to check blood flow to the penis. ?Doing a sleep study at home to measure nighttime erections. ?How is this treated? ?This condition may be treated by: ?Medicines, such as: ?Medicine taken by mouth to help you achieve an erection (oral medicine). ?Hormone replacement therapy to replace low testosterone levels. ?Medicine that is injected into the penis. Your health care provider may instruct you how to give yourself these injections at home. ?Medicine that is delivered with a short applicator tube. The tube is inserted into the opening at the tip of the penis, which is the opening of the urethra. A tiny pellet of medicine is put in the urethra. The pellet dissolves and enhances erectile function. This is also called MUSE (medicated urethral system for erections) therapy. ?Vacuum pump. This is a pump with a ring on it. The pump and ring are placed on the penis and used to create pressure that helps the penis become erect. ?Penile implant surgery. In this procedure, you may receive: ?An inflatable implant. This consists of cylinders, a pump, and a reservoir. The cylinders can be inflated with a fluid that helps to create an erection, and they can be deflated after intercourse. ?A semi-rigid implant. This consists of two silicone rubber rods. The rods provide some rigidity. They are also flexible, so the penis can both curve downward in its normal position and become straight for sexual intercourse. ?Blood vessel surgery to improve blood flow to the penis. During this procedure, a blood vessel from a different part of the body is placed into the penis to allow blood to flow around (bypass) damaged or blocked blood vessels. ?Lifestyle changes,   such as exercising more, losing weight, and quitting smoking. ?Follow  these instructions at home: ?Medicines ? ?Take over-the-counter and prescription medicines only as told by your health care provider. Do not increase the dosage without first discussing it with your health care provider. ?If you are using self-injections, do injections as directed by your health care provider. Make sure you avoid any veins that are on the surface of the penis. After giving an injection, apply pressure to the injection site for 5 minutes. ?Talk to your health care provider about how to prevent headaches while taking ED medicines. These medicines may cause a sudden headache due to the increase in blood flow in your body. ?General instructions ?Exercise regularly, as directed by your health care provider. Work with your health care provider to lose weight, if needed. ?Do not use any products that contain nicotine or tobacco. These products include cigarettes, chewing tobacco, and vaping devices, such as e-cigarettes. If you need help quitting, ask your health care provider. ?Before using a vacuum pump, read the instructions that come with the pump and discuss any questions with your health care provider. ?Keep all follow-up visits. This is important. ?Contact a health care provider if: ?You feel nauseous. ?You are vomiting. ?You get sudden headaches while taking ED medicines. ?You have any concerns about your sexual health. ?Get help right away if: ?You are taking oral or injectable medicines and you have an erection that lasts longer than 4 hours. If your health care provider is unavailable, go to the nearest emergency room for evaluation. An erection that lasts much longer than 4 hours can result in permanent damage to your penis. ?You have severe pain in your groin or abdomen. ?You develop redness or severe swelling of your penis. ?You have redness spreading at your groin or lower abdomen. ?You are unable to urinate. ?You experience chest pain or a rapid heartbeat (palpitations) after taking oral  medicines. ?These symptoms may represent a serious problem that is an emergency. Do not wait to see if the symptoms will go away. Get medical help right away. Call your local emergency services (911 in the [place] Do not drive yourself to the hospital. ?Summary ?Erectile dysfunction (ED) is the inability to get or keep an erection during sexual intercourse. ?This condition is diagnosed based on a physical exam, your symptoms, and tests to determine the cause. Treatment varies depending on the cause and may include medicines, hormone therapy, surgery, or a vacuum pump. ?You may need follow-up visits to make sure that you are using your medicines or devices correctly. ?Get help right away if you are taking or injecting medicines and you have an erection that lasts longer than 4 hours. ?This information is not intended to replace advice given to you by your health care provider. Make sure you discuss any questions you have with your health care provider. ?Document Revised: 01/03/2021 Document Reviewed: 01/03/2021 ?Elsevier Patient Education ? 2023 [employer] ? ?

## 2022-10-10 NOTE — Assessment & Plan Note (Signed)
Will decrease Risperdal to 1 mg daily Support offered 

## 2022-10-10 NOTE — Assessment & Plan Note (Signed)
Will decrease Risperdal to 1 mg daily Support offered

## 2022-11-27 ENCOUNTER — Encounter: Payer: Self-pay | Admitting: Family Medicine

## 2022-11-27 ENCOUNTER — Telehealth (INDEPENDENT_AMBULATORY_CARE_PROVIDER_SITE_OTHER): Payer: 59 | Admitting: Family Medicine

## 2022-11-27 VITALS — Wt 193.0 lb

## 2022-11-27 DIAGNOSIS — J40 Bronchitis, not specified as acute or chronic: Secondary | ICD-10-CM

## 2022-11-27 MED ORDER — BENZONATATE 200 MG PO CAPS: 200.0000 mg | ORAL_CAPSULE | Freq: Two times a day (BID) | ORAL | 0 refills | Status: DC | PRN

## 2022-11-27 MED ORDER — GUAIFENESIN-CODEINE 100-10 MG/5ML PO SYRP: 5.0000 mL | ORAL_SOLUTION | Freq: Two times a day (BID) | ORAL | 0 refills | Status: DC | PRN

## 2022-11-27 MED ORDER — FLUTICASONE PROPIONATE 50 MCG/ACT NA SUSP: 2.0000 | Freq: Every day | NASAL | 0 refills | Status: DC

## 2022-11-27 MED ORDER — AZITHROMYCIN 250 MG PO TABS: ORAL_TABLET | ORAL | 0 refills | Status: DC

## 2022-11-27 NOTE — Progress Notes (Signed)
Subjective:    Patient ID: Dominic Barnes, male    DOB: January 17, 1991, 32 y.o.   MRN: [id number]  Dominic Barnes is a 32 y.o. male presenting on 11/27/2022 for Cough and Nasal Congestion  Virtual / Telehealth Encounter - Video Visit via MyChart The purpose of this virtual visit is to provide medical care while limiting exposure to the novel coronavirus (COVID19) for both patient and office staff.  Consent was obtained for remote visit:  Yes.   Answered questions that patient had about telehealth interaction:  Yes.   I discussed the limitations, risks, security and privacy concerns of performing an evaluation and management service by video/telephone. I also discussed with the patient that there may be a patient responsible charge related to this service. The patient expressed understanding and agreed to proceed.  Patient Location: Home Provider Location: [facility] (Office)  Participants in virtual visit: - Patient: Dominic Barnes - CMA: Orinda Kenner, [place] - Provider: Dr Parks Ranger  PCP Webb Silversmith, FNP    HPI  Sinusitis / Bronchitis Reports symptoms started Thursday with sinus pressure congestion foggy by weekend felt no energy and fatigue, then better then worse again with persistent coughing, some productive cough. - Took Mucinex with some relief - He has had some increased deeper coughing worse day and night. - No sick contacts. - Former smoker quit 10 years ago - Admits fatigued - Admits some chest soreness - Admits hoarse voice temporary 1 day, then it improved. - Denies any fevers chills body aches, dyspnea  Health Maintenance: UTD Flu Vaccine 08/20/22     10/10/2022    9:16 AM 08/20/2022    8:38 AM 02/18/2022    8:43 AM  Depression screen PHQ 2/9  Decreased Interest 1 2 2   Down, Depressed, Hopeless 2 2 3   PHQ - 2 Score 3 4 5   Altered sleeping 2 3 1   Tired, decreased energy 1 2 2   Change in appetite 1 2 0  Feeling bad or failure about  yourself  0 2 2  Trouble concentrating 0 3 1  Moving slowly or fidgety/restless 0 0 1  Suicidal thoughts 0 0 1  PHQ-9 Score 7 16 13   Difficult doing work/chores Somewhat difficult Very difficult Somewhat difficult    Social History   Tobacco Use   Smoking status: Former    Types: Cigarettes    Quit date: 07/21/2012    Years since quitting: 10.3   Smokeless tobacco: Former   Tobacco comments:    quit 5 years /occasional dip  Vaping Use   Vaping Use: Never used  Substance Use Topics   Alcohol use: Yes    Alcohol/week: 7.0 standard drinks of alcohol    Types: 7 Cans of beer per week    Comment: socially   Drug use: Yes    Types: Marijuana    Comment: last used 2 weeks     Review of Systems Per HPI unless specifically indicated above     Objective:    Wt 193 lb (87.5 kg)   BMI 26.18 kg/m   Wt Readings from Last 3 Encounters:  11/27/22 193 lb (87.5 kg)  10/10/22 193 lb (87.5 kg)  09/03/22 190 lb (86.2 kg)    Physical Exam  Note examination was completely remotely via video observation objective data only  Gen - well-appearing, no acute distress or apparent pain, comfortable HEENT - eyes appear clear without discharge or redness Heart/Lungs - cannot examine virtually - observed  no evidence of coughing or labored breathing. Abd - cannot examine virtually - occasional cough Skin - face visible today- no rash Neuro - awake, alert, oriented Psych - not anxious appearing    Results for orders placed or performed in visit on 05/25/20  CBC with Differential  Result Value Ref Range   WBC 5.1 3.8 - 10.8 Thousand/uL   RBC 5.05 4.20 - 5.80 Million/uL   Hemoglobin 15.8 13.2 - 17.1 g/dL   HCT 46.2 38.5 - 50.0 %   MCV 91.5 80.0 - 100.0 fL   MCH 31.3 27.0 - 33.0 pg   MCHC 34.2 32.0 - 36.0 g/dL   RDW 12.5 11.0 - 15.0 %   Platelets 188 140 - 400 Thousand/uL   MPV 11.6 7.5 - 12.5 fL   Neutro Abs 3,290 1,500 - 7,800 cells/uL   Lymphs Abs 1,408 850 - 3,900 cells/uL    Absolute Monocytes 321 200 - 950 cells/uL   Eosinophils Absolute 31 15 - 500 cells/uL   Basophils Absolute 51 0 - 200 cells/uL   Neutrophils Relative % 64.5 %   Total Lymphocyte 27.6 %   Monocytes Relative 6.3 %   Eosinophils Relative 0.6 %   Basophils Relative 1.0 %  COMPLETE METABOLIC PANEL WITH GFR  Result Value Ref Range   Glucose, Bld 88 65 - 99 mg/dL   BUN 12 7 - 25 mg/dL   Creat 0.91 0.60 - 1.35 mg/dL   GFR, Est Non African American 114 > OR = 60 mL/min/1.68m2   GFR, Est African American 132 > OR = 60 mL/min/1.18m2   BUN/Creatinine Ratio NOT APPLICABLE 6 - 22 (calc)   Sodium 140 135 - 146 mmol/L   Potassium 4.4 3.5 - 5.3 mmol/L   Chloride 103 98 - 110 mmol/L   CO2 28 20 - 32 mmol/L   Calcium 9.5 8.6 - 10.3 mg/dL   Total Protein 7.2 6.1 - 8.1 g/dL   Albumin 4.8 3.6 - 5.1 g/dL   Globulin 2.4 1.9 - 3.7 g/dL (calc)   AG Ratio 2.0 1.0 - 2.5 (calc)   Total Bilirubin 3.6 (H) 0.2 - 1.2 mg/dL   Alkaline phosphatase (APISO) 45 36 - 130 U/L   AST 28 10 - 40 U/L   ALT 42 9 - 46 U/L  Thyroid Panel With TSH  Result Value Ref Range   T3 Uptake 41 (H) 22 - 35 %   T4, Total 6.9 4.9 - 10.5 mcg/dL   Free Thyroxine Index 2.8 1.4 - 3.8   TSH 0.71 0.40 - 4.50 mIU/L      Assessment & Plan:   Problem List Items Addressed This Visit   None Visit Diagnoses     Bronchitis    -  Primary   Relevant Medications   azithromycin (ZITHROMAX Z-PAK) 250 MG tablet   fluticasone (FLONASE) 50 MCG/ACT nasal spray   benzonatate (TESSALON) 200 MG capsule   guaiFENesin-codeine (ROBITUSSIN AC) 100-10 MG/5ML syrup       Consistent with worsening bronchitis in setting of likely viral URI - Afebrile No asthma or COPD. Former smoker   Plan: Start Azithromycin Z-pak dosing 500mg  then 250mg  daily x 4 days 2. Start Tessalon Perls take 1 capsule up to 3 times a day as needed for cough 3. Start nasal steroid Flonase 2 sprays in each nostril daily for 4-6 weeks, may repeat course seasonally or as  needed 4. Codeine cough syrup AT NIGHT AS NEEDED OTC regimen Return criteria reviewed, follow-up within 1 week  if not improved    Meds ordered this encounter  Medications   azithromycin (ZITHROMAX Z-PAK) 250 MG tablet    Sig: Take 2 tabs (500mg  total) on Day 1. Take 1 tab (250mg ) daily for next 4 days.    Dispense:  6 tablet    Refill:  0   fluticasone (FLONASE) 50 MCG/ACT nasal spray    Sig: Place 2 sprays into both nostrils daily. Use for 4-6 weeks then stop and use seasonally or as needed.    Dispense:  16 g    Refill:  0   DISCONTD: guaiFENesin-codeine (ROBITUSSIN AC) 100-10 MG/5ML syrup    Sig: Take 5-10 mLs by mouth 2 (two) times daily as needed for cough.    Dispense:  118 mL    Refill:  0   benzonatate (TESSALON) 200 MG capsule    Sig: Take 1 capsule (200 mg total) by mouth 2 (two) times daily as needed for cough.    Dispense:  30 capsule    Refill:  0   guaiFENesin-codeine (ROBITUSSIN AC) 100-10 MG/5ML syrup    Sig: Take 5-10 mLs by mouth 2 (two) times daily as needed for cough.    Dispense:  118 mL    Refill:  0      Follow up plan: Return in about 1 week (around 12/04/2022), or if symptoms worsen or fail to improve, for bronchitis.   Patient verbalizes understanding with the above medical recommendations including the limitation of remote medical advice.  Specific follow-up and call-back criteria were given for patient to follow-up or seek medical care more urgently if needed.  Total duration of direct patient care provided via video conference: 10 minutes   Nobie Putnam, [facility] 11/27/2022, 5:04 PM

## 2022-11-27 NOTE — Telephone Encounter (Signed)
MyChart video visit has been completed  Dominic Barnes, [facility] 11/27/2022, 6:58 PM

## 2022-11-27 NOTE — Patient Instructions (Addendum)
Thank you for coming to the office today.  Acute Bronchitis, Adult  Acute bronchitis is sudden inflammation of the main airways (bronchi) that come off the windpipe (trachea) in the lungs. The swelling causes the airways to get smaller and make more mucus than normal. This can make it hard to breathe and can cause coughing or noisy breathing (wheezing). Acute bronchitis may last several weeks. The cough may last longer. Allergies, asthma, and exposure to smoke may make the condition worse. What are the causes? This condition can be caused by germs and by substances that irritate the lungs, including: Cold and flu viruses. The most common cause of this condition is the virus that causes the common cold. Bacteria. This is less common. Breathing in substances that irritate the lungs, including: Smoke from cigarettes and other forms of tobacco. Dust and pollen. Fumes from household cleaning products, gases, or burned fuel. Indoor or outdoor air pollution. What increases the risk? The following factors may make you more likely to develop this condition: A weak body's defense system, also called the immune system. A condition that affects your lungs and breathing, such as asthma. What are the signs or symptoms? Common symptoms of this condition include: Coughing. This may bring up clear, yellow, or green mucus from your lungs (sputum). Wheezing. Runny or stuffy nose. Having too much mucus in your lungs (chest congestion). Shortness of breath. Aches and pains, including sore throat or chest. How is this diagnosed? This condition is usually diagnosed based on: Your symptoms and medical history. A physical exam. You may also have other tests, including tests to rule out other conditions, such as pneumonia. These tests include: A test of lung function. Test of a mucus sample to look for the presence of bacteria. Tests to check the oxygen level in your blood. Blood tests. Chest X-ray. How  is this treated? Most cases of acute bronchitis clear up over time without treatment. Your health care provider may recommend: Drinking more fluids to help thin your mucus so it is easier to cough up. Taking inhaled medicine (inhaler) to improve air flow in and out of your lungs. Using a vaporizer or a humidifier. These are machines that add water to the air to help you breathe better. Taking a medicine that thins mucus and clears congestion (expectorant). Taking a medicine that prevents or stops coughing (cough suppressant). It is not common to take an antibiotic medicine for this condition. Follow these instructions at home:  Take over-the-counter and prescription medicines only as told by your health care provider. Use an inhaler, vaporizer, or humidifier as told by your health care provider. Take two teaspoons (10 mL) of honey at bedtime to lessen coughing at night. Drink enough fluid to keep your urine pale yellow. Do not use any products that contain nicotine or tobacco. These products include cigarettes, chewing tobacco, and vaping devices, such as e-cigarettes. If you need help quitting, ask your health care provider. Get plenty of rest. Return to your normal activities as told by your health care provider. Ask your health care provider what activities are safe for you. Keep all follow-up visits. This is important. How is this prevented? To lower your risk of getting this condition again: Wash your hands often with soap and water for at least 20 seconds. If soap and water are not available, use hand sanitizer. Avoid contact with people who have cold symptoms. Try not to touch your mouth, nose, or eyes with your hands. Avoid breathing in smoke or  chemical fumes. Breathing smoke or chemical fumes will make your condition worse. Get the flu shot every year. Contact a health care provider if: Your symptoms do not improve after 2 weeks. You have trouble coughing up the mucus. Your  cough keeps you awake at night. You have a fever. Get help right away if you: Cough up blood. Feel pain in your chest. Have severe shortness of breath. Faint or keep feeling like you are going to faint. Have a severe headache. Have a fever or chills that get worse. These symptoms may represent a serious problem that is an emergency. Do not wait to see if the symptoms will go away. Get medical help right away. Call your local emergency services (911 in the [place] Do not drive yourself to the hospital. Summary Acute bronchitis is inflammation of the main airways (bronchi) that come off the windpipe (trachea) in the lungs. The swelling causes the airways to get smaller and make more mucus than normal. Drinking more fluids can help thin your mucus so it is easier to cough up. Take over-the-counter and prescription medicines only as told by your health care provider. Do not use any products that contain nicotine or tobacco. These products include cigarettes, chewing tobacco, and vaping devices, such as e-cigarettes. If you need help quitting, ask your health care provider. Contact a health care provider if your symptoms do not improve after 2 weeks. This information is not intended to replace advice given to you by your health care provider. Make sure you discuss any questions you have with your health care provider. Document Revised: 01/17/2022 Document Reviewed: 02/07/2021 Elsevier Patient Education  [place].   Please schedule a Follow-up Appointment to: Return if symptoms worsen or fail to improve.  If you have any other questions or concerns, please feel free to call the office or send a message through [place]. You may also schedule an earlier appointment if necessary.  Additionally, you may be receiving a survey about your experience at our office within a few days to 1 week by e-mail or mail. We value your feedback.  Nobie Putnam, DO [place]

## 2023-01-08 ENCOUNTER — Other Ambulatory Visit: Payer: Self-pay | Admitting: Internal Medicine

## 2023-01-08 DIAGNOSIS — F5104 Psychophysiologic insomnia: Secondary | ICD-10-CM

## 2023-01-09 MED ORDER — CLONAZEPAM 1 MG PO TABS: ORAL_TABLET | ORAL | 0 refills | Status: DC

## 2023-04-11 ENCOUNTER — Encounter: Payer: Self-pay | Admitting: Internal Medicine

## 2023-04-11 ENCOUNTER — Ambulatory Visit (INDEPENDENT_AMBULATORY_CARE_PROVIDER_SITE_OTHER): Payer: 59 | Admitting: Internal Medicine

## 2023-04-11 VITALS — BP 128/72 | HR 120 | Temp 96.6°F | Ht 72.0 in | Wt 188.0 lb

## 2023-04-11 DIAGNOSIS — Z1159 Encounter for screening for other viral diseases: Secondary | ICD-10-CM

## 2023-04-11 DIAGNOSIS — Z114 Encounter for screening for human immunodeficiency virus [HIV]: Secondary | ICD-10-CM | POA: Diagnosis not present

## 2023-04-11 DIAGNOSIS — E663 Overweight: Secondary | ICD-10-CM

## 2023-04-11 DIAGNOSIS — Z0001 Encounter for general adult medical examination with abnormal findings: Secondary | ICD-10-CM

## 2023-04-11 DIAGNOSIS — Z6825 Body mass index (BMI) 25.0-25.9, adult: Secondary | ICD-10-CM | POA: Diagnosis not present

## 2023-04-11 DIAGNOSIS — R7309 Other abnormal glucose: Secondary | ICD-10-CM

## 2023-04-11 LAB — CBC
Hemoglobin: 16.2 g/dL (ref 13.2–17.1)
Platelets: 275 10*3/uL (ref 140–400)

## 2023-04-11 NOTE — Assessment & Plan Note (Signed)
Encouraged diet and exercise for weight loss ?

## 2023-04-11 NOTE — Patient Instructions (Signed)
Health Maintenance, Male Adopting a healthy lifestyle and getting preventive care are important in promoting health and wellness. Ask your health care provider about: The right schedule for you to have regular tests and exams. Things you can do on your own to prevent diseases and keep yourself healthy. What should I know about diet, weight, and exercise? Eat a healthy diet  Eat a diet that includes plenty of vegetables, fruits, low-fat dairy products, and lean protein. Do not eat a lot of foods that are high in solid fats, added sugars, or sodium. Maintain a healthy weight Body mass index (BMI) is a measurement that can be used to identify possible weight problems. It estimates body fat based on height and weight. Your health care provider can help determine your BMI and help you achieve or maintain a healthy weight. Get regular exercise Get regular exercise. This is one of the most important things you can do for your health. Most adults should: Exercise for at least 150 minutes each week. The exercise should increase your heart rate and make you sweat (moderate-intensity exercise). Do strengthening exercises at least twice a week. This is in addition to the moderate-intensity exercise. Spend less time sitting. Even light physical activity can be beneficial. Watch cholesterol and blood lipids Have your blood tested for lipids and cholesterol at 31 years of age, then have this test every 5 years. You may need to have your cholesterol levels checked more often if: Your lipid or cholesterol levels are high. You are older than 31 years of age. You are at high risk for heart disease. What should I know about cancer screening? Many types of cancers can be detected early and may often be prevented. Depending on your health history and family history, you may need to have cancer screening at various ages. This may include screening for: Colorectal cancer. Prostate cancer. Skin cancer. Lung  cancer. What should I know about heart disease, diabetes, and high blood pressure? Blood pressure and heart disease High blood pressure causes heart disease and increases the risk of stroke. This is more likely to develop in people who have high blood pressure readings or are overweight. Talk with your health care provider about your target blood pressure readings. Have your blood pressure checked: Every 3-5 years if you are 18-39 years of age. Every year if you are 40 years old or older. If you are between the ages of 65 and 75 and are a current or former smoker, ask your health care provider if you should have a one-time screening for abdominal aortic aneurysm (AAA). Diabetes Have regular diabetes screenings. This checks your fasting blood sugar level. Have the screening done: Once every three years after age 45 if you are at a normal weight and have a low risk for diabetes. More often and at a younger age if you are overweight or have a high risk for diabetes. What should I know about preventing infection? Hepatitis B If you have a higher risk for hepatitis B, you should be screened for this virus. Talk with your health care provider to find out if you are at risk for hepatitis B infection. Hepatitis C Blood testing is recommended for: Everyone born from 1945 through 1965. Anyone with known risk factors for hepatitis C. Sexually transmitted infections (STIs) You should be screened each year for STIs, including gonorrhea and chlamydia, if: You are sexually active and are younger than 31 years of age. You are older than 31 years of age and your   health care provider tells you that you are at risk for this type of infection. Your sexual activity has changed since you were last screened, and you are at increased risk for chlamydia or gonorrhea. Ask your health care provider if you are at risk. Ask your health care provider about whether you are at high risk for HIV. Your health care provider  may recommend a prescription medicine to help prevent HIV infection. If you choose to take medicine to prevent HIV, you should first get tested for HIV. You should then be tested every 3 months for as long as you are taking the medicine. Follow these instructions at home: Alcohol use Do not drink alcohol if your health care provider tells you not to drink. If you drink alcohol: Limit how much you have to 0-2 drinks a day. Know how much alcohol is in your drink. In the [place] one drink equals one 12 oz bottle of beer (355 mL), one 5 oz glass of wine (148 mL), or one 1 oz glass of hard liquor (44 mL). Lifestyle Do not use any products that contain nicotine or tobacco. These products include cigarettes, chewing tobacco, and vaping devices, such as e-cigarettes. If you need help quitting, ask your health care provider. Do not use street drugs. Do not share needles. Ask your health care provider for help if you need support or information about quitting drugs. General instructions Schedule regular health, dental, and eye exams. Stay current with your vaccines. Tell your health care provider if: You often feel depressed. You have ever been abused or do not feel safe at home. Summary Adopting a healthy lifestyle and getting preventive care are important in promoting health and wellness. Follow your health care provider's instructions about healthy diet, exercising, and getting tested or screened for diseases. Follow your health care provider's instructions on monitoring your cholesterol and blood pressure. This information is not intended to replace advice given to you by your health care provider. Make sure you discuss any questions you have with your health care provider. Document Revised: 02/26/2021 Document Reviewed: 02/26/2021 Elsevier Patient Education  2024 [employer]  

## 2023-04-11 NOTE — Progress Notes (Signed)
Subjective:    Patient ID: Dominic Barnes, male    DOB: Sep 14, 1991, 32 y.o.   MRN: [id number]  HPI  Patient presents to clinic today for his annual exam.  Flu: 07/2022 Tetanus: 12/2021 COVID: Never Dentist: biannually  Diet: He does eat meat. He consumes fruits and veggies. He does eat some fried foods. He drinks mostly energy drinks, coffee, some water Exercise: None  Review of Systems  Past Medical History:  Diagnosis Date   Anxiety    Arthritis    right elbow before surgery   Asthma    exercise induced. 1 episiode. Approx age 27.   Headache    sinus   Sinus trouble    Testicle pain     Current Outpatient Medications  Medication Sig Dispense Refill   azithromycin (ZITHROMAX Z-PAK) 250 MG tablet Take 2 tabs (500mg  total) on Day 1. Take 1 tab (250mg ) daily for next 4 days. 6 tablet 0   benzonatate (TESSALON) 200 MG capsule Take 1 capsule (200 mg total) by mouth 2 (two) times daily as needed for cough. 30 capsule 0   clonazePAM (KLONOPIN) 1 MG tablet TAKE 1 TABLET BY MOUTH ONCE DAILY AS NEEDED ANXIETY 10 tablet 0   fluticasone (FLONASE) 50 MCG/ACT nasal spray Place 2 sprays into both nostrils daily. Use for 4-6 weeks then stop and use seasonally or as needed. 16 g 0   guaiFENesin-codeine (ROBITUSSIN AC) 100-10 MG/5ML syrup Take 5-10 mLs by mouth 2 (two) times daily as needed for cough. 118 mL 0   losartan (COZAAR) 50 MG tablet Take 1 tablet (50 mg total) by mouth daily. 90 tablet 1   risperiDONE (RISPERDAL) 1 MG tablet Take 1 tablet (1 mg total) by mouth at bedtime. 90 tablet 1   sildenafil (VIAGRA) 100 MG tablet Take 0.5-1 tablets (50-100 mg total) by mouth daily as needed for erectile dysfunction. 10 tablet 5   No current facility-administered medications for this visit.    No Known Allergies  Family History  Problem Relation Age of Onset   Cirrhosis Father    Anxiety disorder Father    Depression Father    Alcohol abuse Father    ADD / ADHD Brother    Kidney  disease Neg Hx    Prostate cancer Neg Hx     Social History   Socioeconomic History   Marital status: Single    Spouse name: Not on file   Number of children: 0   Years of education: Not on file   Highest education level: Some college, no degree  Occupational History   Not on file  Tobacco Use   Smoking status: Former    Types: Cigarettes    Quit date: 07/21/2012    Years since quitting: 10.7   Smokeless tobacco: Former   Tobacco comments:    quit 5 years /occasional dip  Vaping Use   Vaping Use: Never used  Substance and Sexual Activity   Alcohol use: Yes    Alcohol/week: 7.0 standard drinks of alcohol    Types: 7 Cans of beer per week    Comment: socially   Drug use: Yes    Types: Marijuana    Comment: last used 2 weeks    Sexual activity: Not Currently  Other Topics Concern   Not on file  Social History Narrative   Not on file   Social Determinants of Health   Financial Resource Strain: Low Risk  (10/29/2018)   Overall Financial Resource Strain (CARDIA)  Difficulty of Paying Living Expenses: Not very hard  Food Insecurity: No Food Insecurity (10/29/2018)   Hunger Vital Sign    Worried About Running Out of Food in the Last Year: Never true    Ran Out of Food in the Last Year: Never true  Transportation Needs: No Transportation Needs (10/29/2018)   PRAPARE - Administrator, Civil Service (Medical): No    Lack of Transportation (Non-Medical): No  Physical Activity: Inactive (10/29/2018)   Exercise Vital Sign    Days of Exercise per Week: 0 days    Minutes of Exercise per Session: 0 min  Stress: Not on file  Social Connections: Unknown (10/29/2018)   Social Connection and Isolation Panel [NHANES]    Frequency of Communication with Friends and Family: Not on file    Frequency of Social Gatherings with Friends and Family: Not on file    Attends Religious Services: Never    Database administrator or Organizations: No    Attends Banker  Meetings: Never    Marital Status: Never married  Intimate Partner Violence: Not At Risk (10/29/2018)   Humiliation, Afraid, Rape, and Kick questionnaire    Fear of Current or Ex-Partner: No    Emotionally Abused: No    Physically Abused: No    Sexually Abused: No     Constitutional: Denies fever, malaise, fatigue, headache or abrupt weight changes.  HEENT: Denies eye pain, eye redness, ear pain, ringing in the ears, wax buildup, runny nose, nasal congestion, bloody nose, or sore throat. Respiratory: Denies difficulty breathing, shortness of breath, cough or sputum production.   Cardiovascular: Denies chest pain, chest tightness, palpitations or swelling in the hands or feet.  Gastrointestinal: Pt reports reflux. Denies abdominal pain, bloating, constipation, diarrhea or blood in the stool.  GU: Patient reports erectile dysfunction.  Pain denies urgency, frequency, pain with urination, burning sensation, blood in urine, odor or discharge. Musculoskeletal: Denies decrease in range of motion, difficulty with gait, muscle pain or joint pain and swelling.  Skin: Denies redness, rashes, lesions or ulcercations.  Neurological: Pt reports insomnia, difficulty focusing.  Denies dizziness, difficulty with memory, difficulty with speech or problems with balance and coordination.  Psych: She has a history of anxiety and depression.  Denies SI/HI.  No other specific complaints in a complete review of systems (except as listed in HPI above).     Objective:   Physical Exam  BP 128/72 (BP Location: Left Arm, Patient Position: Sitting, Cuff Size: Normal)   Pulse (!) 120   Temp (!) 96.6 F (35.9 C) (Temporal)   Ht 6' (1.829 m)   Wt 188 lb (85.3 kg)   SpO2 99%   BMI 25.50 kg/m   Wt Readings from Last 3 Encounters:  11/27/22 193 lb (87.5 kg)  10/10/22 193 lb (87.5 kg)  09/03/22 190 lb (86.2 kg)    General: Appears his stated age, well developed, well nourished in NAD. Skin: Warm, dry and  intact.  HEENT: Head: normal shape and size; Eyes: sclera white, no icterus, conjunctiva pink, PERRLA and EOMs intact;  Neck:  Neck supple, trachea midline. No masses, lumps or thyromegaly present.  Cardiovascular: Tachycardic with normal rhythm. S1,S2 noted.  No murmur, rubs or gallops noted. No JVD or BLE edema. No carotid bruits noted. Pulmonary/Chest: Normal effort and positive vesicular breath sounds. No respiratory distress. No wheezes, rales or ronchi noted.  Abdomen: Normal bowel sounds.  Musculoskeletal: Strength 5/5 BUE/BLE. No difficulty with gait.  Neurological:  Alert and oriented. Cranial nerves II-XII grossly intact. Coordination normal.  Psychiatric: Mood and affect normal. Behavior is normal. Judgment and thought content normal.    BMET    Component Value Date/Time   NA 140 05/26/2020 0856   K 4.4 05/26/2020 0856   CL 103 05/26/2020 0856   CO2 28 05/26/2020 0856   GLUCOSE 88 05/26/2020 0856   BUN 12 05/26/2020 0856   CREATININE 0.91 05/26/2020 0856   CALCIUM 9.5 05/26/2020 0856   GFRNONAA 114 05/26/2020 0856   GFRAA 132 05/26/2020 0856    Lipid Panel  No results found for: "CHOL", "TRIG", "HDL", "CHOLHDL", "VLDL", "LDLCALC"  CBC    Component Value Date/Time   WBC 5.1 05/26/2020 0856   RBC 5.05 05/26/2020 0856   HGB 15.8 05/26/2020 0856   HCT 46.2 05/26/2020 0856   PLT 188 05/26/2020 0856   MCV 91.5 05/26/2020 0856   MCH 31.3 05/26/2020 0856   MCHC 34.2 05/26/2020 0856   RDW 12.5 05/26/2020 0856   LYMPHSABS 1,408 05/26/2020 0856   EOSABS 31 05/26/2020 0856   BASOSABS 51 05/26/2020 0856    Hgb A1C No results found for: "HGBA1C"         Assessment & Plan:   Preventative health maintenance:  Encouraged him to get a flu shot the fall Tetanus UTd Encouraged him to get his COVID-vaccine Encouraged him to consume a balanced diet and exercise regimen Advised him to see a dentist annually We will check CBC, c-Met, lipid, A1c, HIV and hep C  today  RTC in 6 months, follow-up chronic conditions Nicki Reaper, NP

## 2023-04-12 LAB — COMPLETE METABOLIC PANEL WITH GFR
AG Ratio: 1.8 (calc) (ref 1.0–2.5)
ALT: 93 U/L — ABNORMAL HIGH (ref 9–46)
AST: 64 U/L — ABNORMAL HIGH (ref 10–40)
Albumin: 4.6 g/dL (ref 3.6–5.1)
Alkaline phosphatase (APISO): 78 U/L (ref 36–130)
BUN: 9 mg/dL (ref 7–25)
CO2: 28 mmol/L (ref 20–32)
Calcium: 10 mg/dL (ref 8.6–10.3)
Chloride: 99 mmol/L (ref 98–110)
Creat: 1.16 mg/dL (ref 0.60–1.26)
Globulin: 2.6 g/dL (calc) (ref 1.9–3.7)
Glucose, Bld: 104 mg/dL (ref 65–139)
Potassium: 3.6 mmol/L (ref 3.5–5.3)
Sodium: 139 mmol/L (ref 135–146)
Total Bilirubin: 1.6 mg/dL — ABNORMAL HIGH (ref 0.2–1.2)
Total Protein: 7.2 g/dL (ref 6.1–8.1)
eGFR: 86 mL/min/{1.73_m2} (ref >=60)

## 2023-04-12 LAB — HEMOGLOBIN A1C
Hgb A1c MFr Bld: 4.9 % of total Hgb (ref <5.7)
Mean Plasma Glucose: 94 mg/dL
eAG (mmol/L): 5.2 mmol/L

## 2023-04-12 LAB — CBC
HCT: 45.7 % (ref 38.5–50.0)
MCH: 32.4 pg (ref 27.0–33.0)
MCHC: 35.4 g/dL (ref 32.0–36.0)
MCV: 91.4 fL (ref 80.0–100.0)
MPV: 11.7 fL (ref 7.5–12.5)
RBC: 5 10*6/uL (ref 4.20–5.80)
RDW: 12.3 % (ref 11.0–15.0)
WBC: 7.6 10*3/uL (ref 3.8–10.8)

## 2023-04-12 LAB — LIPID PANEL
Cholesterol: 187 mg/dL (ref <200)
HDL: 54 mg/dL (ref >=40)
LDL Cholesterol (Calc): 83 mg/dL (calc)
Non-HDL Cholesterol (Calc): 133 mg/dL (calc) — ABNORMAL HIGH (ref <130)
Total CHOL/HDL Ratio: 3.5 (calc) (ref <5.0)
Triglycerides: 372 mg/dL — ABNORMAL HIGH (ref <150)

## 2023-04-12 LAB — HIV ANTIBODY (ROUTINE TESTING W REFLEX): HIV 1&2 Ab, 4th Generation: NONREACTIVE

## 2023-04-12 LAB — HEPATITIS C ANTIBODY: Hepatitis C Ab: NONREACTIVE

## 2023-04-15 ENCOUNTER — Other Ambulatory Visit: Payer: Self-pay | Admitting: Internal Medicine

## 2023-04-16 NOTE — Telephone Encounter (Signed)
Requested Prescriptions  Pending Prescriptions Disp Refills   sildenafil (VIAGRA) 100 MG tablet [Pharmacy Med Name: SILDENAFIL CITRATE 100 MG TAB] 10 tablet 5    Sig: TAKE 1/2 TO 1 TABLET BY MOUTH ONCE DAILYAS DIRECTED     Urology: Erectile Dysfunction Agents Failed - 04/15/2023  4:01 PM      Failed - AST in normal range and within 360 days    AST  Date Value Ref Range Status  04/11/2023 64 (H) 10 - 40 U/L Final         Failed - ALT in normal range and within 360 days    ALT  Date Value Ref Range Status  04/11/2023 93 (H) 9 - 46 U/L Final         Passed - Last BP in normal range    BP Readings from Last 1 Encounters:  04/11/23 128/72         Passed - Valid encounter within last 12 months    Recent Outpatient Visits           5 days ago Encounter for general adult medical examination with abnormal findings   [place] [facility] [place], Salvadore Oxford, NP   4 months ago Bronchitis   [place] [facility] Smitty Cords, DO   6 months ago Primary hypertension   [place] [facility] [place], Salvadore Oxford, NP   7 months ago Primary hypertension   [place] [facility] Paschal Dopp, [place]   7 months ago Primary hypertension   [place] [facility] [place], Salvadore Oxford, NP       Future Appointments             In 6 months Baity, Salvadore Oxford, NP [place] [facility]             risperiDONE (RISPERDAL) 1 MG tablet [Pharmacy Med Name: RISPERIDONE 1 MG TAB] 90 tablet 1    Sig: TAKE 1 TABLET BY MOUTH AT BEDTIME     Not Delegated - Psychiatry:  Antipsychotics - Second Generation (Atypical) - risperidone Failed - 04/15/2023  4:01 PM      Failed - This refill cannot be delegated      Failed - TSH in normal range and within 360 days    TSH  Date Value Ref Range Status  05/26/2020 0.71 0.40 - 4.50 mIU/L Final         Failed - Last Heart Rate in normal range    Pulse  Readings from Last 1 Encounters:  04/11/23 (!) 120         Failed - Lipid Panel in normal range within the last 12 months    Cholesterol  Date Value Ref Range Status  04/11/2023 187 <200 mg/dL Final   LDL Cholesterol (Calc)  Date Value Ref Range Status  04/11/2023 83 mg/dL (calc) Final    Comment:    Reference range: <100 . Desirable range <100 mg/dL for primary prevention;   <70 mg/dL for patients with CHD or diabetic patients  with > or = 2 CHD risk factors. [name] LDL-C is now calculated using the Martin-Hopkins  calculation, which is a validated novel method providing  better accuracy than the Friedewald equation in the  estimation of LDL-C.  Horald Pollen et al. Lenox Ahr. 4098;119(14): 2061-2068  (http://education.[facility].com/faq/FAQ164)    HDL  Date Value Ref Range Status  04/11/2023 54 > OR = 40 mg/dL  Final   Triglycerides  Date Value Ref Range Status  04/11/2023 372 (H) <150 mg/dL Final    Comment:    . If a non-fasting specimen was collected, consider repeat triglyceride testing on a fasting specimen if clinically indicated.  Perry Mount et al. J. of Clin. Lipidol. 2015;9:129-169. .          Failed - CBC within normal limits and completed in the last 12 months    WBC  Date Value Ref Range Status  04/11/2023 7.6 3.8 - 10.8 Thousand/uL Final   RBC  Date Value Ref Range Status  04/11/2023 5.00 4.20 - 5.80 Million/uL Final   Hemoglobin  Date Value Ref Range Status  04/11/2023 16.2 13.2 - 17.1 g/dL Final   HCT  Date Value Ref Range Status  04/11/2023 45.7 38.5 - 50.0 % Final   MCHC  Date Value Ref Range Status  04/11/2023 35.4 32.0 - 36.0 g/dL Final   [facility]  Date Value Ref Range Status  04/11/2023 32.4 27.0 - 33.0 pg Final   MCV  Date Value Ref Range Status  04/11/2023 91.4 80.0 - 100.0 fL Final   No results found for: "PLTCOUNTKUC", "LABPLAT", "POCPLA" RDW  Date Value Ref Range Status  04/11/2023 12.3 11.0 - 15.0 % Final         Failed -  CMP within normal limits and completed in the last 12 months    No results found for: "ALBUMIN", "ALBMS", "POCALB", "ALBUMINELP" Alkaline phosphatase (APISO)  Date Value Ref Range Status  04/11/2023 78 36 - 130 U/L Final   ALT  Date Value Ref Range Status  04/11/2023 93 (H) 9 - 46 U/L Final   AST  Date Value Ref Range Status  04/11/2023 64 (H) 10 - 40 U/L Final   BUN  Date Value Ref Range Status  04/11/2023 9 7 - 25 mg/dL Final   Calcium  Date Value Ref Range Status  04/11/2023 10.0 8.6 - 10.3 mg/dL Final   CO2  Date Value Ref Range Status  04/11/2023 28 20 - 32 mmol/L Final   Creat  Date Value Ref Range Status  04/11/2023 1.16 0.60 - 1.26 mg/dL Final   Glucose, Bld  Date Value Ref Range Status  04/11/2023 104 65 - 139 mg/dL Final    Comment:    .        Non-fasting reference interval .    Potassium  Date Value Ref Range Status  04/11/2023 3.6 3.5 - 5.3 mmol/L Final   Sodium  Date Value Ref Range Status  04/11/2023 139 135 - 146 mmol/L Final   Total Bilirubin  Date Value Ref Range Status  04/11/2023 1.6 (H) 0.2 - 1.2 mg/dL Final   Protein, UA  Date Value Ref Range Status  06/11/2018 Negative Negative Final  11/28/2015 Negative Negative/Trace Final   Total Protein  Date Value Ref Range Status  04/11/2023 7.2 6.1 - 8.1 g/dL Final   GFR, Est African American  Date Value Ref Range Status  05/26/2020 132 > OR = 60 mL/min/1.91m2 Final   eGFR  Date Value Ref Range Status  04/11/2023 86 > OR = 60 mL/min/1.54m2 Final   GFR, Est Non African American  Date Value Ref Range Status  05/26/2020 114 > OR = 60 mL/min/1.92m2 Final         Passed - Completed PHQ-2 or PHQ-9 in the last 360 days      Passed - Last BP in normal range    BP Readings from Last  1 Encounters:  04/11/23 128/72         Passed - Valid encounter within last 6 months    Recent Outpatient Visits           5 days ago Encounter for general adult medical examination with abnormal  findings   [place] [facility] [place], Salvadore Oxford, NP   4 months ago Bronchitis   [place] Smitty Cords, DO   6 months ago Primary hypertension   [place] [facility] [place], Salvadore Oxford, NP   7 months ago Primary hypertension   [place] [facility] Paschal Dopp, [place]   7 months ago Primary hypertension   [place] [facility] [place], Salvadore Oxford, NP       Future Appointments             In 6 months Baity, Salvadore Oxford, NP  [facility], [place]

## 2023-04-16 NOTE — Telephone Encounter (Signed)
Requested medication (s) are due for refill today: yes  Requested medication (s) are on the active medication list: yes  Last refill; 10/10/22   #90  1 refill  Future visit scheduled  yes 10/13/23  Notes to clinic: Not delegated, please review. Thank you.  Requested Prescriptions  Pending Prescriptions Disp Refills   risperiDONE (RISPERDAL) 1 MG tablet [Pharmacy Med Name: RISPERIDONE 1 MG TAB] 90 tablet 1    Sig: TAKE 1 TABLET BY MOUTH AT BEDTIME     Not Delegated - Psychiatry:  Antipsychotics - Second Generation (Atypical) - risperidone Failed - 04/15/2023  4:01 PM      Failed - This refill cannot be delegated      Failed - TSH in normal range and within 360 days    TSH  Date Value Ref Range Status  05/26/2020 0.71 0.40 - 4.50 mIU/L Final         Failed - Last Heart Rate in normal range    Pulse Readings from Last 1 Encounters:  04/11/23 (!) 120         Failed - Lipid Panel in normal range within the last 12 months    Cholesterol  Date Value Ref Range Status  04/11/2023 187 <200 mg/dL Final   LDL Cholesterol (Calc)  Date Value Ref Range Status  04/11/2023 83 mg/dL (calc) Final    Comment:    Reference range: <100 . Desirable range <100 mg/dL for primary prevention;   <70 mg/dL for patients with CHD or diabetic patients  with > or = 2 CHD risk factors. [name] LDL-C is now calculated using the Martin-Hopkins  calculation, which is a validated novel method providing  better accuracy than the Friedewald equation in the  estimation of LDL-C.  Horald Pollen et al. Lenox Ahr. 1610;960(45): 2061-2068  (http://education.[facility].com/faq/FAQ164)    HDL  Date Value Ref Range Status  04/11/2023 54 > OR = 40 mg/dL Final   Triglycerides  Date Value Ref Range Status  04/11/2023 372 (H) <150 mg/dL Final    Comment:    . If a non-fasting specimen was collected, consider repeat triglyceride testing on a fasting specimen if clinically indicated.  Perry Mount et al. J. of Clin.  Lipidol. 2015;9:129-169. .          Failed - CBC within normal limits and completed in the last 12 months    WBC  Date Value Ref Range Status  04/11/2023 7.6 3.8 - 10.8 Thousand/uL Final   RBC  Date Value Ref Range Status  04/11/2023 5.00 4.20 - 5.80 Million/uL Final   Hemoglobin  Date Value Ref Range Status  04/11/2023 16.2 13.2 - 17.1 g/dL Final   HCT  Date Value Ref Range Status  04/11/2023 45.7 38.5 - 50.0 % Final   MCHC  Date Value Ref Range Status  04/11/2023 35.4 32.0 - 36.0 g/dL Final   [facility]  Date Value Ref Range Status  04/11/2023 32.4 27.0 - 33.0 pg Final   MCV  Date Value Ref Range Status  04/11/2023 91.4 80.0 - 100.0 fL Final   No results found for: "PLTCOUNTKUC", "LABPLAT", "POCPLA" RDW  Date Value Ref Range Status  04/11/2023 12.3 11.0 - 15.0 % Final         Failed - CMP within normal limits and completed in the last 12 months    No results found for: "ALBUMIN", "ALBMS", "POCALB", "ALBUMINELP" Alkaline phosphatase (APISO)  Date Value Ref Range Status  04/11/2023 78 36 - 130 U/L Final   ALT  Date Value Ref Range Status  04/11/2023 93 (H) 9 - 46 U/L Final   AST  Date Value Ref Range Status  04/11/2023 64 (H) 10 - 40 U/L Final   BUN  Date Value Ref Range Status  04/11/2023 9 7 - 25 mg/dL Final   Calcium  Date Value Ref Range Status  04/11/2023 10.0 8.6 - 10.3 mg/dL Final   CO2  Date Value Ref Range Status  04/11/2023 28 20 - 32 mmol/L Final   Creat  Date Value Ref Range Status  04/11/2023 1.16 0.60 - 1.26 mg/dL Final   Glucose, Bld  Date Value Ref Range Status  04/11/2023 104 65 - 139 mg/dL Final    Comment:    .        Non-fasting reference interval .    Potassium  Date Value Ref Range Status  04/11/2023 3.6 3.5 - 5.3 mmol/L Final   Sodium  Date Value Ref Range Status  04/11/2023 139 135 - 146 mmol/L Final   Total Bilirubin  Date Value Ref Range Status  04/11/2023 1.6 (H) 0.2 - 1.2 mg/dL Final   Protein, UA  Date  Value Ref Range Status  06/11/2018 Negative Negative Final  11/28/2015 Negative Negative/Trace Final   Total Protein  Date Value Ref Range Status  04/11/2023 7.2 6.1 - 8.1 g/dL Final   GFR, Est African American  Date Value Ref Range Status  05/26/2020 132 > OR = 60 mL/min/1.5m2 Final   eGFR  Date Value Ref Range Status  04/11/2023 86 > OR = 60 mL/min/1.71m2 Final   GFR, Est Non African American  Date Value Ref Range Status  05/26/2020 114 > OR = 60 mL/min/1.55m2 Final         Passed - Completed PHQ-2 or PHQ-9 in the last 360 days      Passed - Last BP in normal range    BP Readings from Last 1 Encounters:  04/11/23 128/72         Passed - Valid encounter within last 6 months    Recent Outpatient Visits           5 days ago Encounter for general adult medical examination with abnormal findings   [place] [facility] [place], Salvadore Oxford, NP   4 months ago Bronchitis   [place] [facility] Smitty Cords, DO   6 months ago Primary hypertension   [place] [facility] [place], Salvadore Oxford, NP   7 months ago Primary hypertension   [place] [facility] Paschal Dopp, [place]   7 months ago Primary hypertension    [facility] [place], Salvadore Oxford, NP       Future Appointments             In 6 months Baity, Salvadore Oxford, NP  [facility], [place]            Signed Prescriptions Disp Refills   sildenafil (VIAGRA) 100 MG tablet 10 tablet 5    Sig: TAKE 1/2 TO 1 TABLET BY MOUTH ONCE DAILYAS DIRECTED     Urology: Erectile Dysfunction Agents Failed - 04/15/2023  4:01 PM      Failed - AST in normal range and within 360 days    AST  Date Value Ref Range Status  04/11/2023 64 (H) 10 - 40 U/L Final         Failed - ALT in normal range  and within 360 days    ALT  Date Value Ref Range Status  04/11/2023 93 (H) 9 - 46 U/L Final         Passed - Last BP  in normal range    BP Readings from Last 1 Encounters:  04/11/23 128/72         Passed - Valid encounter within last 12 months    Recent Outpatient Visits           5 days ago Encounter for general adult medical examination with abnormal findings   [place] [facility] [place], Salvadore Oxford, NP   4 months ago Bronchitis   [place] [facility] Smitty Cords, DO   6 months ago Primary hypertension   [place] [facility] [place], Salvadore Oxford, NP   7 months ago Primary hypertension   [place] [facility] Paschal Dopp, [place]   7 months ago Primary hypertension   [place] [facility] [place], Salvadore Oxford, NP       Future Appointments             In 6 months Baity, Salvadore Oxford, NP [facility], [place]

## 2023-05-09 ENCOUNTER — Ambulatory Visit: Payer: Self-pay | Admitting: *Deleted

## 2023-05-09 ENCOUNTER — Other Ambulatory Visit: Payer: Self-pay | Admitting: Internal Medicine

## 2023-05-09 DIAGNOSIS — F5104 Psychophysiologic insomnia: Secondary | ICD-10-CM

## 2023-05-09 MED ORDER — CLONAZEPAM 1 MG PO TABS
ORAL_TABLET | ORAL | 0 refills | Status: DC
Start: 2023-05-09 — End: 2023-05-14

## 2023-05-09 NOTE — Telephone Encounter (Signed)
Agree with advice given

## 2023-05-09 NOTE — Telephone Encounter (Signed)
Reason for Disposition  Patient sounds very upset or troubled to the triager    Let his mother know about taking him to the ED or to the [facility] in [place].    I gave her the address and phone number and strongly recommended he go there as that is their speciality is mental health.  Answer Assessment - Initial Assessment Questions 1. CONCERN: "anything happen that prompted you to call today?"      Mom Renetta Chalk calling in.  Her son is on the way to her house.    He has left work and is crying real hard.   His girlfriend just texted mother and said he is crying so hard she, girlfriend, can't hardly understand him.   He asked his mother to call [facility] and make him an appt. Which I did for 05/13/2023 at 11:00 with Nicki Reaper, NP.    His back has been messed up.  He has been is physical pain.   He works 2 jobs.    He is so stressed.   He has had several breakdowns per his mother Lawson Fiscal. 2. ANXIETY SYMPTOMS: "Can you describe how you (your loved one; patient) have been feeling?" (e.g., tense, restless, panicky, anxious, keyed up, overwhelmed, sense of impending doom).      He is crying so hard girlfriend can't hardly understand him.  Girlfriend is texting with [name]'s mother now. He went to the chiropractor for his back which has helped.  He has been doing much better as far as his back pain but he is having spells where he is breaking down and crying.   I know he takes respiradol to help him.   3. ONSET: "How long have you been feeling this way?" (e.g., hours, days, weeks)     He has been like this for several days.   He was hurting so bad with his back.   He went to the chiropractor and which helped him a lot.   His back pain is much improved after seeing the chiropractor.  4. SEVERITY: "How would you rate the level of anxiety?" (e.g., 0 - 10; or mild, moderate, severe).     Son is crying real hard per girlfriend. He loves his job.   He just broke down.     5. FUNCTIONAL IMPAIRMENT: "How have these feelings affected your ability to do daily activities?" "Have you had more difficulty than usual doing your normal daily activities?" (e.g., getting better, same, worse; self-care, school, work, interactions)     He has broke down and is crying.   He is on the way home from his job.    6. HISTORY: "Have you felt this way before?" "Have you ever been diagnosed with an anxiety problem in the past?" (e.g., generalized anxiety disorder, panic attacks, PTSD). If Yes, ask: "How was this problem treated?" (e.g., medicines, counseling, etc.)     Yes   7. RISK OF HARM - SUICIDAL IDEATION: "Do you ever have thoughts of hurting or killing yourself?" If Yes, ask:  "Do you have these feelings now?" "Do you have a plan on how you would do this?"     Not asked since mother was not with him. 8. TREATMENT:  "What has been done so far to treat this anxiety?" (e.g., medicines, relaxation strategies). "What has helped?"     Nothing    He has had to leave work before due to crying and havingbreakdowns. 9. TREATMENT - THERAPIST: "  Do you have a counselor or therapist? Name?"     No 10. POTENTIAL TRIGGERS: "Do you drink caffeinated beverages (e.g., coffee, colas, teas), and how much daily?" "Do you drink alcohol or use any drugs?" "Have you started any new medicines recently?"       Stress from 2 jobs and back pain 11. PATIENT SUPPORT: "Who is with you now?" "Who do you live with?" "Do you have family or friends who you can talk to?"        His mother and girlfriend are supportive and concerned about him. 12. OTHER SYMPTOMS: "Do you have any other symptoms?" (e.g., feeling depressed, trouble concentrating, trouble sleeping, trouble breathing, palpitations or fast heartbeat, chest pain, sweating, nausea, or diarrhea)       Crying a lot.  Trouble with working. 13. PREGNANCY: "Is there any chance you are pregnant?" "When was your last menstrual period?"       N/A  Protocols  used: Anxiety and Panic Attack-A-AH

## 2023-05-09 NOTE — Telephone Encounter (Signed)
  Chief Complaint: Mother Renetta Chalk called in concerned about pt having severe anxiety and crying episodes.   He just left work and is on the way to her house now.   He asked his mother to call [facility] and get him an appt.which is why his mother was calling in for him. Symptoms: Crying real hard and having "breakdowns".    Frequency: This has been going on for several days now. Pertinent Negatives: Patient denies Pt not with mother at time of the call so unable to determine if he was suicidal or not. Disposition: [x] ED /[] Urgent Care (no appt availability in office) / [] Appointment(In office/virtual)/ []  [place] [facility]/ [] Home Care/ [] Refused Recommended Disposition /[] [place] [facility]/ []  Follow-up with PCP Additional Notes: Appt made with Nicki Reaper, NP for 05/13/2023 at 11:00.   I also let Lawson Fiscal know that he can go to the ED for mental health assistance too especially with the weekend coming up.    I also gave his mother the name and information for the [facility] in [place] and made the recommendation that he go there for help once he gets to her house.   That is their specialty is mental health.   Lawson Fiscal was agreeable to trying to get him to go there.

## 2023-05-13 ENCOUNTER — Ambulatory Visit (INDEPENDENT_AMBULATORY_CARE_PROVIDER_SITE_OTHER): Payer: 59 | Admitting: Internal Medicine

## 2023-05-13 ENCOUNTER — Encounter: Payer: Self-pay | Admitting: Internal Medicine

## 2023-05-13 VITALS — BP 118/76 | HR 118 | Ht 72.0 in | Wt 196.0 lb

## 2023-05-13 DIAGNOSIS — F5104 Psychophysiologic insomnia: Secondary | ICD-10-CM

## 2023-05-13 DIAGNOSIS — Z0289 Encounter for other administrative examinations: Secondary | ICD-10-CM | POA: Diagnosis not present

## 2023-05-13 DIAGNOSIS — F410 Panic disorder [episodic paroxysmal anxiety] without agoraphobia: Secondary | ICD-10-CM | POA: Diagnosis not present

## 2023-05-13 DIAGNOSIS — F4321 Adjustment disorder with depressed mood: Secondary | ICD-10-CM

## 2023-05-13 DIAGNOSIS — F3181 Bipolar II disorder: Secondary | ICD-10-CM | POA: Diagnosis not present

## 2023-05-13 DIAGNOSIS — F411 Generalized anxiety disorder: Secondary | ICD-10-CM

## 2023-05-13 MED ORDER — RISPERIDONE 2 MG PO TABS: 2.0000 mg | ORAL_TABLET | Freq: Every day | ORAL | 0 refills | Status: DC

## 2023-05-13 NOTE — Patient Instructions (Signed)
Get FMLA from your HR department. Upload these to your mychart so that I can fill them out for your. Take 2 1 mg risperidone at bedtime until you run out and I will send in a new RX for 2 mg tabs to your pharmacy. Take clonazepam only as needed - try to avoid daily as this can be come addictive.

## 2023-05-13 NOTE — Progress Notes (Signed)
Subjective:    Patient ID: Dominic Barnes, male    DOB: Jul 25, 1991, 32 y.o.   MRN: [id number]  HPI  Patient presents to clinic today for follow-up of anxiety, depression and insomnia.  He reports this has been worse lately since a back injury 1 week ago. He reports he was unable to be seen for this immediately, and he was having to push through 60-80 work week. He did eventually see a chiropractor that popped his SI joint back into place. He reports he missed work partial on Tuesday and Wednesday, all day Thursday and Friday. He is concerned that he is going to lose his job. On top of that, his grandfather died this morning. He has been having crying spells. He does have a strong family support between his girlfriend and mother. He is taking risperdal and clonazepam as prescribed.  He is not currently seeing a therapist.  He denies SI/HI.  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 1 episiode. Approx age 65.   Headache    sinus   Sinus trouble    Testicle pain     Current Outpatient Medications  Medication Sig Dispense Refill   clonazePAM (KLONOPIN) 1 MG tablet TAKE 1 TABLET BY MOUTH ONCE DAILY AS NEEDED ANXIETY 10 tablet 0   fluticasone (FLONASE) 50 MCG/ACT nasal spray Place 2 sprays into both nostrils daily. Use for 4-6 weeks then stop and use seasonally or as needed. 16 g 0   losartan (COZAAR) 50 MG tablet Take 1 tablet (50 mg total) by mouth daily. 90 tablet 1   risperiDONE (RISPERDAL) 1 MG tablet TAKE 1 TABLET BY MOUTH AT BEDTIME 90 tablet 1   sildenafil (VIAGRA) 100 MG tablet TAKE 1/2 TO 1 TABLET BY MOUTH ONCE DAILYAS DIRECTED 10 tablet 5   No current facility-administered medications for this visit.    No Known Allergies  Family History  Problem Relation Age of Onset   Healthy Mother    Cirrhosis Father    Anxiety disorder Father    Depression Father    Alcohol  abuse Father    ADD / ADHD Brother    Kidney disease Neg Hx    Prostate cancer Neg Hx    Colon cancer Neg Hx     Social History   Socioeconomic History   Marital status: Single    Spouse name: Not on file   Number of children: 0   Years of education: Not on file   Highest education level: Some college, no degree  Occupational History   Not on file  Tobacco Use   Smoking status: Former    Current packs/day: 0.00    Types: Cigarettes    Quit date: 07/21/2012    Years since quitting: 10.8   Smokeless tobacco: Former   Tobacco comments:    quit 5 years /occasional dip  Vaping Use   Vaping status: Never Used  Substance and Sexual Activity   Alcohol use: Yes    Alcohol/week: 7.0 standard drinks of alcohol    Types: 7 Cans of beer per week    Comment: socially   Drug use: Yes    Types: Marijuana    Comment: last used 2 weeks    Sexual activity: Not Currently  Other Topics Concern   Not on file  Social History Narrative  Not on file   Social Determinants of Health   Financial Resource Strain: Low Risk  (10/29/2018)   Overall Financial Resource Strain (CARDIA)    Difficulty of Paying Living Expenses: Not very hard  Food Insecurity: No Food Insecurity (10/29/2018)   Hunger Vital Sign    Worried About Running Out of Food in the Last Year: Never true    Ran Out of Food in the Last Year: Never true  Transportation Needs: No Transportation Needs (10/29/2018)   PRAPARE - Administrator, Civil Service (Medical): No    Lack of Transportation (Non-Medical): No  Physical Activity: Inactive (10/29/2018)   Exercise Vital Sign    Days of Exercise per Week: 0 days    Minutes of Exercise per Session: 0 min  Stress: Not on file  Social Connections: Unknown (10/29/2018)   Social Connection and Isolation Panel [NHANES]    Frequency of Communication with Friends and Family: Not on file    Frequency of Social Gatherings with Friends and Family: Not on file    Attends Religious  Services: Never    Database administrator or Organizations: No    Attends Banker Meetings: Never    Marital Status: Never married  Intimate Partner Violence: Not At Risk (10/29/2018)   Humiliation, Afraid, Rape, and Kick questionnaire    Fear of Current or Ex-Partner: No    Emotionally Abused: No    Physically Abused: No    Sexually Abused: No     Constitutional: Denies fever, malaise, fatigue, headache or abrupt weight changes.  HEENT: Denies eye pain, eye redness, ear pain, ringing in the ears, wax buildup, runny nose, nasal congestion, bloody nose, or sore throat. Respiratory: Denies difficulty breathing, shortness of breath, cough or sputum production.   Cardiovascular: Denies chest pain, chest tightness, palpitations or swelling in the hands or feet.  Gastrointestinal: Denies abdominal pain, bloating, constipation, diarrhea or blood in the stool.  GU: Denies urgency, frequency, pain with urination, burning sensation, blood in urine, odor or discharge. Musculoskeletal: Denies decrease in range of motion, difficulty with gait, muscle pain or joint pain and swelling.  Skin: Denies redness, rashes, lesions or ulcercations.  Neurological: Patient reports insomnia.  Denies dizziness, difficulty with memory, difficulty with speech or problems with balance and coordination.  Psych: Patient has a history of anxiety and depression.  Denies SI/HI.  No other specific complaints in a complete review of systems (except as listed in HPI above).  Objective:   Physical Exam   BP (!) 118/94   Pulse (!) 118   Ht 6' (1.829 m)   Wt 196 lb (88.9 kg)   SpO2 98%   BMI 26.58 kg/m   Wt Readings from Last 3 Encounters:  04/11/23 188 lb (85.3 kg)  11/27/22 193 lb (87.5 kg)  10/10/22 193 lb (87.5 kg)    General: Appears his stated age, overweight, in NAD. Skin: Warm, dry and intact.  HEENT: Head: normal shape and size; Eyes: sclera white, no icterus, conjunctiva pink, PERRLA and  EOMs intact;  Cardiovascular: Tachycardic with normal rhythm.  Pulmonary/Chest: Normal effort and positive vesicular breath sounds. No respiratory distress. No wheezes, rales or ronchi noted.  Musculoskeletal:  No difficulty with gait.  Neurological: Alert and oriented.  Psychiatric: Mood and affect normal. Tearful. Judgment and thought content normal.   BMET    Component Value Date/Time   NA 139 04/11/2023 1342   K 3.6 04/11/2023 1342   CL 99 04/11/2023 1342  CO2 28 04/11/2023 1342   GLUCOSE 104 04/11/2023 1342   BUN 9 04/11/2023 1342   CREATININE 1.16 04/11/2023 1342   CALCIUM 10.0 04/11/2023 1342   GFRNONAA 114 05/26/2020 0856   GFRAA 132 05/26/2020 0856    Lipid Panel     Component Value Date/Time   CHOL 187 04/11/2023 1342   TRIG 372 (H) 04/11/2023 1342   HDL 54 04/11/2023 1342   CHOLHDL 3.5 04/11/2023 1342   LDLCALC 83 04/11/2023 1342    CBC    Component Value Date/Time   WBC 7.6 04/11/2023 1342   RBC 5.00 04/11/2023 1342   HGB 16.2 04/11/2023 1342   HCT 45.7 04/11/2023 1342   PLT 275 04/11/2023 1342   MCV 91.4 04/11/2023 1342   MCH 32.4 04/11/2023 1342   MCHC 35.4 04/11/2023 1342   RDW 12.3 04/11/2023 1342   LYMPHSABS 1,408 05/26/2020 0856   EOSABS 31 05/26/2020 0856   BASOSABS 51 05/26/2020 0856    Hgb A1C Lab Results  Component Value Date   HGBA1C 4.9 04/11/2023           Assessment & Plan:   RTC in 5 months for follow-up of chronic conditions Nicki Reaper, NP

## 2023-05-13 NOTE — Assessment & Plan Note (Signed)
Deteriorated due to situational stress Will increase risperidone to 2 mg at bedtime Continue clonazepam as needed- avoid taking daily Will fill out FMLA forms for 7/16-7/19, 7/23 and day of funeral which he will let me know by [employer] offered

## 2023-05-14 ENCOUNTER — Encounter: Payer: Self-pay | Admitting: Internal Medicine

## 2023-05-14 ENCOUNTER — Other Ambulatory Visit: Payer: Self-pay | Admitting: Internal Medicine

## 2023-05-14 DIAGNOSIS — F5104 Psychophysiologic insomnia: Secondary | ICD-10-CM

## 2023-05-19 MED ORDER — CLONAZEPAM 1 MG PO TABS
ORAL_TABLET | ORAL | 0 refills | Status: DC
Start: 2023-05-19 — End: 2023-07-29

## 2023-05-31 DIAGNOSIS — M545 Low back pain, unspecified: Secondary | ICD-10-CM | POA: Diagnosis not present

## 2023-05-31 DIAGNOSIS — M25511 Pain in right shoulder: Secondary | ICD-10-CM | POA: Diagnosis not present

## 2023-07-28 ENCOUNTER — Other Ambulatory Visit: Payer: Self-pay | Admitting: Internal Medicine

## 2023-07-29 ENCOUNTER — Ambulatory Visit: Payer: Self-pay

## 2023-07-29 ENCOUNTER — Ambulatory Visit: Payer: 59 | Admitting: Internal Medicine

## 2023-07-29 ENCOUNTER — Encounter: Payer: Self-pay | Admitting: Internal Medicine

## 2023-07-29 VITALS — BP 138/96 | HR 107 | Temp 96.8°F | Wt 200.0 lb

## 2023-07-29 DIAGNOSIS — B309 Viral conjunctivitis, unspecified: Secondary | ICD-10-CM

## 2023-07-29 DIAGNOSIS — F411 Generalized anxiety disorder: Secondary | ICD-10-CM | POA: Diagnosis not present

## 2023-07-29 DIAGNOSIS — F410 Panic disorder [episodic paroxysmal anxiety] without agoraphobia: Secondary | ICD-10-CM | POA: Diagnosis not present

## 2023-07-29 DIAGNOSIS — Z23 Encounter for immunization: Secondary | ICD-10-CM | POA: Diagnosis not present

## 2023-07-29 MED ORDER — CLONAZEPAM 1 MG PO TABS
ORAL_TABLET | ORAL | 0 refills | Status: DC
Start: 2023-07-29 — End: 2023-10-13

## 2023-07-29 NOTE — Telephone Encounter (Signed)
Chief Complaint: Eye swelling Symptoms: right eye swollen, red, itching, burning, discharge  Frequency: constant onset this morning Pertinent Negatives: Patient denies fever, runny nose, pain  Disposition: [] ED /[] Urgent Care (no appt availability in office) / [x] Appointment(In office/virtual)/ []  [place] [facility]/ [] Home Care/ [] Refused Recommended Disposition /[] [place] [facility]/ []  Follow-up with PCP Additional Notes: Patient states he woke up this morning and noticed his right eye was swollen shut and had a lot of eye "crust" around the eye. Patient states it only burns when rubbing the eye and it is mildly itchy. Patient states he tool Claritin this morning and thought the symptoms were due to seasonal allergies but the symptoms have not improved much since taking the medication. The right eye is currently moderately swollen, halfway shut. Care advice was given and patient has been scheduled in office today at 1420 to see PCP.  Reason for Disposition  MODERATE-SEVERE eyelid swelling on one side  (Exception: Due to a mosquito bite.)  Answer Assessment - Initial Assessment Questions 1. ONSET: "When did the swelling start?" (e.g., minutes, hours, days)     This morning 2. LOCATION: "What part of the eyelids is swollen?"     Right eye top  3. SEVERITY: "How swollen is it?"     Half shut  4. ITCHING: "Is there any itching?" If Yes, ask: "How much?"   (Scale 1-10; mild, moderate or severe)     Mild 5. PAIN: "Is the swelling painful to touch?" If Yes, ask: "How painful is it?"   (Scale 1-10; mild, moderate or severe)     0/10 6. FEVER: "Do you have a fever?" If Yes, ask: "What is it, how was it measured, and when did it start?"      No 7. CAUSE: "What do you think is causing the swelling?"     I'm not sure  8. RECURRENT SYMPTOM: "Have you had eyelid swelling before?" If Yes, ask: "When was the last time?" "What happened that time?"     Yes, from allergies 9. OTHER SYMPTOMS: "Do  you have any other symptoms?" (e.g., blurred vision, eye discharge, rash, runny nose)     Eye discharge, swollen, burning  Protocols used: Eye - Swelling-A-AH

## 2023-07-29 NOTE — Assessment & Plan Note (Signed)
Clonazepam refilled today

## 2023-07-29 NOTE — Telephone Encounter (Signed)
Requested medication (s) are due for refill today: yes  Requested medication (s) are on the active medication list: yes  Last refill:  05/13/23 #90  Future visit scheduled: yes  Notes to clinic:  med not delegated to NT to RF   Requested Prescriptions  Pending Prescriptions Disp Refills   risperiDONE (RISPERDAL) 2 MG tablet [Pharmacy Med Name: RISPERIDONE 2 MG TAB] 90 tablet 0    Sig: TAKE 1 TABLET BY MOUTH AT BEDTIME     Not Delegated - Psychiatry:  Antipsychotics - Second Generation (Atypical) - risperidone Failed - 07/28/2023  6:15 PM      Failed - This refill cannot be delegated      Failed - TSH in normal range and within 360 days    TSH  Date Value Ref Range Status  05/26/2020 0.71 0.40 - 4.50 mIU/L Final         Failed - Last Heart Rate in normal range    Pulse Readings from Last 1 Encounters:  05/13/23 (!) 118         Failed - Lipid Panel in normal range within the last 12 months    Cholesterol  Date Value Ref Range Status  04/11/2023 187 <200 mg/dL Final   LDL Cholesterol (Calc)  Date Value Ref Range Status  04/11/2023 83 mg/dL (calc) Final    Comment:    Reference range: <100 . Desirable range <100 mg/dL for primary prevention;   <70 mg/dL for patients with CHD or diabetic patients  with > or = 2 CHD risk factors. [name] LDL-C is now calculated using the Martin-Hopkins  calculation, which is a validated novel method providing  better accuracy than the Friedewald equation in the  estimation of LDL-C.  Horald Pollen et al. Lenox Ahr. 9147;829(56): 2061-2068  (http://education.[facility].com/faq/FAQ164)    HDL  Date Value Ref Range Status  04/11/2023 54 > OR = 40 mg/dL Final   Triglycerides  Date Value Ref Range Status  04/11/2023 372 (H) <150 mg/dL Final    Comment:    . If a non-fasting specimen was collected, consider repeat triglyceride testing on a fasting specimen if clinically indicated.  Perry Mount et al. J. of Clin. Lipidol. 2015;9:129-169. .           Failed - CBC within normal limits and completed in the last 12 months    WBC  Date Value Ref Range Status  04/11/2023 7.6 3.8 - 10.8 Thousand/uL Final   RBC  Date Value Ref Range Status  04/11/2023 5.00 4.20 - 5.80 Million/uL Final   Hemoglobin  Date Value Ref Range Status  04/11/2023 16.2 13.2 - 17.1 g/dL Final   HCT  Date Value Ref Range Status  04/11/2023 45.7 38.5 - 50.0 % Final   MCHC  Date Value Ref Range Status  04/11/2023 35.4 32.0 - 36.0 g/dL Final   [facility]  Date Value Ref Range Status  04/11/2023 32.4 27.0 - 33.0 pg Final   MCV  Date Value Ref Range Status  04/11/2023 91.4 80.0 - 100.0 fL Final   No results found for: "PLTCOUNTKUC", "LABPLAT", "POCPLA" RDW  Date Value Ref Range Status  04/11/2023 12.3 11.0 - 15.0 % Final         Failed - CMP within normal limits and completed in the last 12 months    No results found for: "ALBUMIN", "ALBMS", "POCALB", "ALBUMINELP" Alkaline phosphatase (APISO)  Date Value Ref Range Status  04/11/2023 78 36 - 130 U/L Final   ALT  Date Value Ref  Range Status  04/11/2023 93 (H) 9 - 46 U/L Final   AST  Date Value Ref Range Status  04/11/2023 64 (H) 10 - 40 U/L Final   BUN  Date Value Ref Range Status  04/11/2023 9 7 - 25 mg/dL Final   Calcium  Date Value Ref Range Status  04/11/2023 10.0 8.6 - 10.3 mg/dL Final   CO2  Date Value Ref Range Status  04/11/2023 28 20 - 32 mmol/L Final   Creat  Date Value Ref Range Status  04/11/2023 1.16 0.60 - 1.26 mg/dL Final   Glucose, Bld  Date Value Ref Range Status  04/11/2023 104 65 - 139 mg/dL Final    Comment:    .        Non-fasting reference interval .    Potassium  Date Value Ref Range Status  04/11/2023 3.6 3.5 - 5.3 mmol/L Final   Sodium  Date Value Ref Range Status  04/11/2023 139 135 - 146 mmol/L Final   Total Bilirubin  Date Value Ref Range Status  04/11/2023 1.6 (H) 0.2 - 1.2 mg/dL Final   Protein, UA  Date Value Ref Range Status   06/11/2018 Negative Negative Final  11/28/2015 Negative Negative/Trace Final   Total Protein  Date Value Ref Range Status  04/11/2023 7.2 6.1 - 8.1 g/dL Final   GFR, Est African American  Date Value Ref Range Status  05/26/2020 132 > OR = 60 mL/min/1.24m2 Final   eGFR  Date Value Ref Range Status  04/11/2023 86 > OR = 60 mL/min/1.35m2 Final   GFR, Est Non African American  Date Value Ref Range Status  05/26/2020 114 > OR = 60 mL/min/1.5m2 Final         Passed - Completed PHQ-2 or PHQ-9 in the last 360 days      Passed - Last BP in normal range    BP Readings from Last 1 Encounters:  05/13/23 118/76         Passed - Valid encounter within last 6 months    Recent Outpatient Visits           2 months ago Bipolar 2 disorder, major depressive episode [facility])   [facility] [place], Salvadore Oxford, NP   3 months ago Encounter for general adult medical examination with abnormal findings   [place] [facility] [place], Salvadore Oxford, NP   8 months ago Bronchitis   [place] [facility] Smitty Cords, DO   9 months ago Primary hypertension   [place] [facility] [place], Salvadore Oxford, NP   10 months ago Primary hypertension   [facility] Paschal Dopp, [place]       Future Appointments             Today [place], Salvadore Oxford, NP  [facility]   In 2 months [place], Salvadore Oxford, NP [facility]

## 2023-07-29 NOTE — Progress Notes (Signed)
Subjective:    Patient ID: Dominic Barnes, male    DOB: 1991/02/14, 32 y.o.   MRN: [id number]  HPI  Patient presents the clinic today with complaint of right eye swelling, discharge and itching.  He reports he woke up this morning with his eye this way.  He reports the swelling, discharge and itching have improved throughout the day.  He has had some runny nose and nasal congestion as well as sneezing which she attributes to allergies.  He has taken Claritin OTC with some relief of symptoms.  He denies ear pain, sore throat, cough, shortness of breath, nausea, vomiting or diarrhea.  He denies fever, chills or bodyaches.  Of note, he would like a refill of his clonazepam today as well.  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 1 episiode. Approx age 82.   Headache    sinus   Sinus trouble    Testicle pain     Current Outpatient Medications  Medication Sig Dispense Refill   clonazePAM (KLONOPIN) 1 MG tablet TAKE 1 TABLET BY MOUTH ONCE DAILY AS NEEDED ANXIETY 10 tablet 0   fluticasone (FLONASE) 50 MCG/ACT nasal spray Place 2 sprays into both nostrils daily. Use for 4-6 weeks then stop and use seasonally or as needed. 16 g 0   losartan (COZAAR) 50 MG tablet Take 1 tablet (50 mg total) by mouth daily. 90 tablet 1   risperiDONE (RISPERDAL) 2 MG tablet Take 1 tablet (2 mg total) by mouth at bedtime. 90 tablet 0   sildenafil (VIAGRA) 100 MG tablet TAKE 1/2 TO 1 TABLET BY MOUTH ONCE DAILYAS DIRECTED 10 tablet 5   No current facility-administered medications for this visit.    No Known Allergies  Family History  Problem Relation Age of Onset   Healthy Mother    Cirrhosis Father    Anxiety disorder Father    Depression Father    Alcohol abuse Father    ADD / ADHD Brother    Kidney disease Neg Hx    Prostate cancer Neg Hx    Colon cancer Neg Hx     Social History    Socioeconomic History   Marital status: Single    Spouse name: Not on file   Number of children: 0   Years of education: Not on file   Highest education level: Some college, no degree  Occupational History   Not on file  Tobacco Use   Smoking status: Former    Current packs/day: 0.00    Types: Cigarettes    Quit date: 07/21/2012    Years since quitting: 11.0   Smokeless tobacco: Former   Tobacco comments:    quit 5 years /occasional dip  Vaping Use   Vaping status: Never Used  Substance and Sexual Activity   Alcohol use: Yes    Alcohol/week: 7.0 standard drinks of alcohol    Types: 7 Cans of beer per week    Comment: socially   Drug use: Yes    Types: Marijuana    Comment: last used 2 weeks    Sexual activity: Not Currently  Other Topics Concern   Not on file  Social History Narrative   Not on file   Social Determinants of Health   Financial Resource Strain: Low Risk  (10/29/2018)   Overall Financial Resource Strain (CARDIA)    Difficulty  of Paying Living Expenses: Not very hard  Food Insecurity: No Food Insecurity (10/29/2018)   Hunger Vital Sign    Worried About Running Out of Food in the Last Year: Never true    Ran Out of Food in the Last Year: Never true  Transportation Needs: No Transportation Needs (10/29/2018)   PRAPARE - Administrator, Civil Service (Medical): No    Lack of Transportation (Non-Medical): No  Physical Activity: Inactive (10/29/2018)   Exercise Vital Sign    Days of Exercise per Week: 0 days    Minutes of Exercise per Session: 0 min  Stress: Not on file  Social Connections: Unknown (10/29/2018)   Social Connection and Isolation Panel [NHANES]    Frequency of Communication with Friends and Family: Not on file    Frequency of Social Gatherings with Friends and Family: Not on file    Attends Religious Services: Never    Database administrator or Organizations: No    Attends Banker Meetings: Never    Marital Status: Never  married  Intimate Partner Violence: Not At Risk (10/29/2018)   Humiliation, Afraid, Rape, and Kick questionnaire    Fear of Current or Ex-Partner: No    Emotionally Abused: No    Physically Abused: No    Sexually Abused: No     Constitutional: Denies fever, malaise, fatigue, headache or abrupt weight changes.  HEENT: Patient reports right eye welling, discharge and itching, runny nose and nasal congestion.  Denies ear pain, ringing in the ears, wax buildup, bloody nose, or sore throat. Respiratory: Denies difficulty breathing, shortness of breath, cough or sputum production.   Cardiovascular: Denies chest pain, chest tightness, palpitations or swelling in the hands or feet.  Neurological: Patient reports insomnia.  Denies dizziness, difficulty with memory, difficulty with speech or problems with balance and coordination.  Psych: Patient has a history of anxiety and depression.  DeniesSI/HI.  No other specific complaints in a complete review of systems (except as listed in HPI above).     Objective:   Physical Exam  BP (!) 138/96 (BP Location: Left Arm, Patient Position: Sitting, Cuff Size: Normal)   Pulse (!) 107   Temp (!) 96.8 F (36 C) (Temporal)   Wt 200 lb (90.7 kg)   SpO2 96%   BMI 27.12 kg/m   Wt Readings from Last 3 Encounters:  05/13/23 196 lb (88.9 kg)  04/11/23 188 lb (85.3 kg)  11/27/22 193 lb (87.5 kg)    General: Appears his stated age, overweight, in NAD. Skin: Warm, dry and intact. No rashes noted. HEENT: Head: normal shape and size; right eyes: sclera white, no icterus, conjunctiva erythematous without discharge, PERRLA and EOMs intact;   Neck: No adenopathy noted. Cardiovascular: Tachycardic with normal rhythm. S1,S2 noted.  No murmur, rubs or gallops noted.  Pulmonary/Chest: Normal effort and positive vesicular breath sounds. No respiratory distress. No wheezes, rales or ronchi noted.  Neurological: Alert and oriented.   BMET    Component Value  Date/Time   NA 139 04/11/2023 1342   K 3.6 04/11/2023 1342   CL 99 04/11/2023 1342   CO2 28 04/11/2023 1342   GLUCOSE 104 04/11/2023 1342   BUN 9 04/11/2023 1342   CREATININE 1.16 04/11/2023 1342   CALCIUM 10.0 04/11/2023 1342   GFRNONAA 114 05/26/2020 0856   GFRAA 132 05/26/2020 0856    Lipid Panel     Component Value Date/Time   CHOL 187 04/11/2023 1342   TRIG 372 (  H) 04/11/2023 1342   HDL 54 04/11/2023 1342   CHOLHDL 3.5 04/11/2023 1342   LDLCALC 83 04/11/2023 1342    CBC    Component Value Date/Time   WBC 7.6 04/11/2023 1342   RBC 5.00 04/11/2023 1342   HGB 16.2 04/11/2023 1342   HCT 45.7 04/11/2023 1342   PLT 275 04/11/2023 1342   MCV 91.4 04/11/2023 1342   MCH 32.4 04/11/2023 1342   MCHC 35.4 04/11/2023 1342   RDW 12.3 04/11/2023 1342   LYMPHSABS 1,408 05/26/2020 0856   EOSABS 31 05/26/2020 0856   BASOSABS 51 05/26/2020 0856    Hgb A1C Lab Results  Component Value Date   HGBA1C 4.9 04/11/2023           Assessment & Plan:   Viral conjunctivitis, allergic rhinitis:  Continue Claritin OTC Recommend warm compresses 3 times daily Can use saline drops to right eye every 2 hours as needed for itching and irritation No indication for antibiotics at this time but would consider if symptoms worsen  RTC in 2 months for follow-up of chronic conditions Nicki Reaper, NP

## 2023-07-29 NOTE — Patient Instructions (Signed)
Viral Conjunctivitis, Adult  Viral conjunctivitis is an inflammation of the conjunctiva. The conjunctiva is the clear membrane that covers the white part of the eye and the inner surface of the eyelid. The inflammation is caused by a viral infection. The blood vessels in the conjunctiva become large, causing the eye to become red or pink and often itchy and tearing. The inflammation usually starts in one eye and goes to the other in a day or two. Infections usually go away over 1-2 weeks. Viral conjunctivitis is contagious. This means it can be easily passed from one person to another. This condition is often called pink eye. What are the causes? This condition is caused by a virus. It can be spread by touching objects that have been contaminated with the virus, such as doorknobs or towels, and then touching your eye. It can also be passed through tiny droplets, such as from coughing or sneezing. What increases the risk? You are more likely to develop this condition if you have a cold or the flu, or are in close contact with a person who has pink eye. What are the signs or symptoms? Symptoms of this condition include: Redness in the eye. Tearing or watery eyes. Itchy and irritated eyes. Burning feeling in the eyes. Clear drainage from the eye. Swollen eyelids. A gritty feeling in the eye. Light sensitivity. This condition often occurs with other symptoms, such as nasal congestion, cough, and fever. How is this diagnosed? This condition is diagnosed with a medical history and physical exam. If you have discharge from your eye, the discharge may be tested for a virus or to rule out other causes of conjunctivitis. How is this treated? Viral conjunctivitis does not respond to medicines that kill bacteria (antibiotics). The condition most often goes away on its own in 1-2 weeks. If treatment is needed, it is aimed at relieving your symptoms and preventing the spread of infection. This may be done  with artificial tear drops, antihistamine drops, or other eye medicines. In rare cases, steroid eye drops or anti-herpes virus medicines may be prescribed. Follow these instructions at home: Medicines  Take or apply over-the-counter and prescription medicines only as told by your health care provider. Do not touch the edge of the eyelid with the eye-drop bottle or ointment tube when applying medicines to the affected eye. This will prevent the spread of the infection to the other eye or to other people. Eye care Avoid touching or rubbing your eyes. Apply a clean, cool, wet washcloth onto your eye for 10-20 minutes, 3-4 times per day, or as told by your health care provider. If you wear contact lenses, do notwear them until the inflammation is gone and your health care provider says it is safe to wear them again. Ask your health care provider how to disinfect or replace your contact lenses before using them again. Wear glasses until you can resume wearing contacts. Avoid wearing eye makeup until the inflammation is gone. Throw away any old eye cosmetics that may be contaminated. Gently wipe away any crusting from your eye with a wet washcloth or a cotton ball. General instructions Change or wash your pillowcase every day or as told by your health care provider. Do not share towels, pillowcases, washcloths, eye makeup, makeup brushes, eye drops, contact lenses, or eyeglasses. This may spread the infection. Wash your hands often with soap and water. Use paper towels to dry your hands. If soap and water are not available, use hand sanitizer. Avoid contact  with other people until your eye is no longer red and tearing, or as told by your health care provider. Keep all follow-up visits. Contact a health care provider if: Your symptoms do not improve with treatment, or they get worse. You have increased pain. Your vision becomes blurry. You have a fever. You have facial pain, redness, or  swelling. You have yellow or green drainage coming from your eye. You have new symptoms. Get help right away if: You develop severe pain. Your vision gets much worse. Summary Viral conjunctivitis is an inflammation of the conjunctiva. It usually goes away in 1-2 weeks. The condition is caused by a virus and is spread by touching contaminated objects or breathing in droplets from a cough or a sneeze. This condition is usually treated with medicines and cold compresses to relieve the symptoms. Because it is caused by a virus, it should not be treated with antibiotics. This condition is very contagious. To prevent infection, avoid close contact with others, wash your hands often, and do not share towels or washcloths. Contact a health care provider if your symptoms do not go away with treatment, or if you have blurry vision, facial swelling, or increased pain. This information is not intended to replace advice given to you by your health care provider. Make sure you discuss any questions you have with your health care provider. Document Revised: 11/14/2021 Document Reviewed: 11/14/2021 Elsevier Patient Education  2024 [employer].

## 2023-07-29 NOTE — Telephone Encounter (Signed)
Advised pt if he wanted to stay out of work I would send message to [place], NP for work letter. Pt prefers to work and talked to Production designer, theatre/television/film and will take precautions and call back if needed.   Summary: medical ?   Pt called in wants to know should he stay out of work a couple of days and stay away from people since he has d of viral pink eye       Reason for Disposition  Health Information question, no triage required and triager able to answer question  Answer Assessment - Initial Assessment Questions 1. REASON FOR CALL or QUESTION: "What is your reason for calling today?" or "How can I best help you?" or "What question do you have that I can help answer?"     Pt wanting to know if he needed to stay out of work or not  Protocols used: Information Only Call - No Triage-A-AH

## 2023-08-14 ENCOUNTER — Encounter: Payer: Self-pay | Admitting: Internal Medicine

## 2023-08-14 MED ORDER — BUSPIRONE HCL 10 MG PO TABS: 10.0000 mg | ORAL_TABLET | Freq: Two times a day (BID) | ORAL | 1 refills | Status: DC

## 2023-08-14 NOTE — Addendum Note (Signed)
Addended by: Lorre Munroe on: 08/14/2023 02:25 PM   Modules accepted: Orders

## 2023-10-06 ENCOUNTER — Other Ambulatory Visit: Payer: Self-pay | Admitting: Internal Medicine

## 2023-10-07 NOTE — Telephone Encounter (Signed)
Requested medication (s) are due for refill today: Yes  Requested medication (s) are on the active medication list: Yes  Last refill:  07/29/23  Future visit scheduled: Yes  Notes to clinic:  Not delegated.    Requested Prescriptions  Pending Prescriptions Disp Refills   risperiDONE (RISPERDAL) 2 MG tablet [Pharmacy Med Name: RISPERIDONE 2 MG TAB] 90 tablet 0    Sig: TAKE 1 TABLET BY MOUTH AT BEDTIME     Not Delegated - Psychiatry:  Antipsychotics - Second Generation (Atypical) - risperidone Failed - 10/07/2023 11:12 AM      Failed - This refill cannot be delegated      Failed - TSH in normal range and within 360 days    TSH  Date Value Ref Range Status  05/26/2020 0.71 0.40 - 4.50 mIU/L Final         Failed - Last BP in normal range    BP Readings from Last 1 Encounters:  07/29/23 (!) 138/96         Failed - Lipid Panel in normal range within the last 12 months    Cholesterol  Date Value Ref Range Status  04/11/2023 187 <200 mg/dL Final   LDL Cholesterol (Calc)  Date Value Ref Range Status  04/11/2023 83 mg/dL (calc) Final    Comment:    Reference range: <100 . Desirable range <100 mg/dL for primary prevention;   <70 mg/dL for patients with CHD or diabetic patients  with > or = 2 CHD risk factors. [name] LDL-C is now calculated using the Martin-Hopkins  calculation, which is a validated novel method providing  better accuracy than the Friedewald equation in the  estimation of LDL-C.  Horald Pollen et al. Lenox Ahr. 3329;518(84): 2061-2068  (http://education.[facility].com/faq/FAQ164)    HDL  Date Value Ref Range Status  04/11/2023 54 > OR = 40 mg/dL Final   Triglycerides  Date Value Ref Range Status  04/11/2023 372 (H) <150 mg/dL Final    Comment:    . If a non-fasting specimen was collected, consider repeat triglyceride testing on a fasting specimen if clinically indicated.  Perry Mount et al. J. of Clin. Lipidol. 2015;9:129-169. .          Failed - CBC  within normal limits and completed in the last 12 months    WBC  Date Value Ref Range Status  04/11/2023 7.6 3.8 - 10.8 Thousand/uL Final   RBC  Date Value Ref Range Status  04/11/2023 5.00 4.20 - 5.80 Million/uL Final   Hemoglobin  Date Value Ref Range Status  04/11/2023 16.2 13.2 - 17.1 g/dL Final   HCT  Date Value Ref Range Status  04/11/2023 45.7 38.5 - 50.0 % Final   MCHC  Date Value Ref Range Status  04/11/2023 35.4 32.0 - 36.0 g/dL Final   [facility]  Date Value Ref Range Status  04/11/2023 32.4 27.0 - 33.0 pg Final   MCV  Date Value Ref Range Status  04/11/2023 91.4 80.0 - 100.0 fL Final   No results found for: "PLTCOUNTKUC", "LABPLAT", "POCPLA" RDW  Date Value Ref Range Status  04/11/2023 12.3 11.0 - 15.0 % Final         Failed - CMP within normal limits and completed in the last 12 months    No results found for: "ALBUMIN", "ALBMS", "POCALB", "ALBUMINELP" Alkaline phosphatase (APISO)  Date Value Ref Range Status  04/11/2023 78 36 - 130 U/L Final   ALT  Date Value Ref Range Status  04/11/2023 93 (H) 9 -  46 U/L Final   AST  Date Value Ref Range Status  04/11/2023 64 (H) 10 - 40 U/L Final   BUN  Date Value Ref Range Status  04/11/2023 9 7 - 25 mg/dL Final   Calcium  Date Value Ref Range Status  04/11/2023 10.0 8.6 - 10.3 mg/dL Final   CO2  Date Value Ref Range Status  04/11/2023 28 20 - 32 mmol/L Final   Creat  Date Value Ref Range Status  04/11/2023 1.16 0.60 - 1.26 mg/dL Final   Glucose, Bld  Date Value Ref Range Status  04/11/2023 104 65 - 139 mg/dL Final    Comment:    .        Non-fasting reference interval .    Potassium  Date Value Ref Range Status  04/11/2023 3.6 3.5 - 5.3 mmol/L Final   Sodium  Date Value Ref Range Status  04/11/2023 139 135 - 146 mmol/L Final   Total Bilirubin  Date Value Ref Range Status  04/11/2023 1.6 (H) 0.2 - 1.2 mg/dL Final   Protein, UA  Date Value Ref Range Status  06/11/2018 Negative Negative  Final  11/28/2015 Negative Negative/Trace Final   Total Protein  Date Value Ref Range Status  04/11/2023 7.2 6.1 - 8.1 g/dL Final   GFR, Est African American  Date Value Ref Range Status  05/26/2020 132 > OR = 60 mL/min/1.47m2 Final   eGFR  Date Value Ref Range Status  04/11/2023 86 > OR = 60 mL/min/1.30m2 Final   GFR, Est Non African American  Date Value Ref Range Status  05/26/2020 114 > OR = 60 mL/min/1.61m2 Final         Passed - Completed PHQ-2 or PHQ-9 in the last 360 days      Passed - Last Heart Rate in normal range    Pulse Readings from Last 1 Encounters:  07/29/23 (!) 107         Passed - Valid encounter within last 6 months    Recent Outpatient Visits           2 months ago Viral conjunctivitis of right eye   [place] [facility] [place], Salvadore Oxford, NP   4 months ago Bipolar 2 disorder, major depressive episode [facility])   [facility] [place], Salvadore Oxford, NP   5 months ago Encounter for general adult medical examination with abnormal findings   [facility] [place], Salvadore Oxford, NP   10 months ago Bronchitis   [facility] [place] Smitty Cords, DO   12 months ago Primary hypertension   [facility] [place], Salvadore Oxford, NP       Future Appointments             In 6 days [place], Salvadore Oxford, NP [place] [facility]

## 2023-10-13 ENCOUNTER — Ambulatory Visit: Payer: 59 | Admitting: Internal Medicine

## 2023-10-13 ENCOUNTER — Other Ambulatory Visit: Payer: Self-pay | Admitting: Internal Medicine

## 2023-10-13 ENCOUNTER — Encounter: Payer: Self-pay | Admitting: Internal Medicine

## 2023-10-13 VITALS — BP 136/74 | Ht 72.0 in | Wt 201.8 lb

## 2023-10-13 DIAGNOSIS — K580 Irritable bowel syndrome with diarrhea: Secondary | ICD-10-CM | POA: Diagnosis not present

## 2023-10-13 DIAGNOSIS — F5104 Psychophysiologic insomnia: Secondary | ICD-10-CM

## 2023-10-13 DIAGNOSIS — M545 Low back pain, unspecified: Secondary | ICD-10-CM | POA: Diagnosis not present

## 2023-10-13 DIAGNOSIS — F411 Generalized anxiety disorder: Secondary | ICD-10-CM | POA: Diagnosis not present

## 2023-10-13 DIAGNOSIS — F3181 Bipolar II disorder: Secondary | ICD-10-CM | POA: Diagnosis not present

## 2023-10-13 DIAGNOSIS — Z6827 Body mass index (BMI) 27.0-27.9, adult: Secondary | ICD-10-CM | POA: Diagnosis not present

## 2023-10-13 DIAGNOSIS — F410 Panic disorder [episodic paroxysmal anxiety] without agoraphobia: Secondary | ICD-10-CM

## 2023-10-13 DIAGNOSIS — N522 Drug-induced erectile dysfunction: Secondary | ICD-10-CM

## 2023-10-13 DIAGNOSIS — E663 Overweight: Secondary | ICD-10-CM

## 2023-10-13 DIAGNOSIS — E781 Pure hyperglyceridemia: Secondary | ICD-10-CM | POA: Diagnosis not present

## 2023-10-13 DIAGNOSIS — R739 Hyperglycemia, unspecified: Secondary | ICD-10-CM

## 2023-10-13 DIAGNOSIS — I10 Essential (primary) hypertension: Secondary | ICD-10-CM

## 2023-10-13 DIAGNOSIS — G8929 Other chronic pain: Secondary | ICD-10-CM

## 2023-10-13 MED ORDER — CLONAZEPAM 1 MG PO TABS
ORAL_TABLET | ORAL | 0 refills | Status: DC
Start: 2023-10-13 — End: 2024-02-09

## 2023-10-13 MED ORDER — SILDENAFIL CITRATE 100 MG PO TABS: 100.0000 mg | ORAL_TABLET | ORAL | 5 refills | Status: DC | PRN

## 2023-10-13 NOTE — Assessment & Plan Note (Signed)
 Clonazepam refilled today

## 2023-10-13 NOTE — Assessment & Plan Note (Signed)
Encouraged diet and exercise for weight loss ?

## 2023-10-13 NOTE — Patient Instructions (Signed)
Health Maintenance, Male Adopting a healthy lifestyle and getting preventive care are important in promoting health and wellness. Ask your health care provider about: The right schedule for you to have regular tests and exams. Things you can do on your own to prevent diseases and keep yourself healthy. What should I know about diet, weight, and exercise? Eat a healthy diet  Eat a diet that includes plenty of vegetables, fruits, low-fat dairy products, and lean protein. Do not eat a lot of foods that are high in solid fats, added sugars, or sodium. Maintain a healthy weight Body mass index (BMI) is a measurement that can be used to identify possible weight problems. It estimates body fat based on height and weight. Your health care provider can help determine your BMI and help you achieve or maintain a healthy weight. Get regular exercise Get regular exercise. This is one of the most important things you can do for your health. Most adults should: Exercise for at least 150 minutes each week. The exercise should increase your heart rate and make you sweat (moderate-intensity exercise). Do strengthening exercises at least twice a week. This is in addition to the moderate-intensity exercise. Spend less time sitting. Even light physical activity can be beneficial. Watch cholesterol and blood lipids Have your blood tested for lipids and cholesterol at 31 years of age, then have this test every 5 years. You may need to have your cholesterol levels checked more often if: Your lipid or cholesterol levels are high. You are older than 31 years of age. You are at high risk for heart disease. What should I know about cancer screening? Many types of cancers can be detected early and may often be prevented. Depending on your health history and family history, you may need to have cancer screening at various ages. This may include screening for: Colorectal cancer. Prostate cancer. Skin cancer. Lung  cancer. What should I know about heart disease, diabetes, and high blood pressure? Blood pressure and heart disease High blood pressure causes heart disease and increases the risk of stroke. This is more likely to develop in people who have high blood pressure readings or are overweight. Talk with your health care provider about your target blood pressure readings. Have your blood pressure checked: Every 3-5 years if you are 18-39 years of age. Every year if you are 40 years old or older. If you are between the ages of 65 and 75 and are a current or former smoker, ask your health care provider if you should have a one-time screening for abdominal aortic aneurysm (AAA). Diabetes Have regular diabetes screenings. This checks your fasting blood sugar level. Have the screening done: Once every three years after age 45 if you are at a normal weight and have a low risk for diabetes. More often and at a younger age if you are overweight or have a high risk for diabetes. What should I know about preventing infection? Hepatitis B If you have a higher risk for hepatitis B, you should be screened for this virus. Talk with your health care provider to find out if you are at risk for hepatitis B infection. Hepatitis C Blood testing is recommended for: Everyone born from 1945 through 1965. Anyone with known risk factors for hepatitis C. Sexually transmitted infections (STIs) You should be screened each year for STIs, including gonorrhea and chlamydia, if: You are sexually active and are younger than 31 years of age. You are older than 31 years of age and your   health care provider tells you that you are at risk for this type of infection. Your sexual activity has changed since you were last screened, and you are at increased risk for chlamydia or gonorrhea. Ask your health care provider if you are at risk. Ask your health care provider about whether you are at high risk for HIV. Your health care provider  may recommend a prescription medicine to help prevent HIV infection. If you choose to take medicine to prevent HIV, you should first get tested for HIV. You should then be tested every 3 months for as long as you are taking the medicine. Follow these instructions at home: Alcohol use Do not drink alcohol if your health care provider tells you not to drink. If you drink alcohol: Limit how much you have to 0-2 drinks a day. Know how much alcohol is in your drink. In the [place] one drink equals one 12 oz bottle of beer (355 mL), one 5 oz glass of wine (148 mL), or one 1 oz glass of hard liquor (44 mL). Lifestyle Do not use any products that contain nicotine or tobacco. These products include cigarettes, chewing tobacco, and vaping devices, such as e-cigarettes. If you need help quitting, ask your health care provider. Do not use street drugs. Do not share needles. Ask your health care provider for help if you need support or information about quitting drugs. General instructions Schedule regular health, dental, and eye exams. Stay current with your vaccines. Tell your health care provider if: You often feel depressed. You have ever been abused or do not feel safe at home. Summary Adopting a healthy lifestyle and getting preventive care are important in promoting health and wellness. Follow your health care provider's instructions about healthy diet, exercising, and getting tested or screened for diseases. Follow your health care provider's instructions on monitoring your cholesterol and blood pressure. This information is not intended to replace advice given to you by your health care provider. Make sure you discuss any questions you have with your health care provider. Document Revised: 02/26/2021 Document Reviewed: 02/26/2021 Elsevier Patient Education  2024 [employer]  

## 2023-10-13 NOTE — Assessment & Plan Note (Signed)
Continue risperidone Continue clonazepam as needed- avoid taking daily Support offered

## 2023-10-13 NOTE — Progress Notes (Signed)
Subjective:    Patient ID: Dominic Barnes, male    DOB: 12/05/1990, 32 y.o.   MRN: [id number]  HPI  Patient presents to clinic today for follow-up of chronic conditions.  IBS: Mainly diarrhea. He does not take any medications for this. Colonoscopy from 10/2017 reviewed.  Anxiety, panic attacks and depression: Chronic, managed with clonazepam and risperidone.  He is no longer taking buspirone.  He is not currently seeing a therapist or psychiatrist.  He denies SI/HI.  Insomnia: He has difficulty staying asleep.  He is taking risperidone as prescribed.  There is no sleep study on file.  HTN: His BP today is 142/88.  He is taking losartan as prescribed.  ECG from 10/2018 reviewed.  ED: Managed with sildenafil.  He does not follow with urology.  HLD: His last LDL was 83, triglycerides 956, 03/2023.  He is not taking any cholesterol-lowering medication at this time.  He does not consume a low-fat diet.  Chronic left side low back pain: Managed with ibuprofen as needed. He follows with [facility].   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 1 episiode. Approx age 84.   Headache    sinus   Sinus trouble    Testicle pain     Current Outpatient Medications  Medication Sig Dispense Refill   busPIRone (BUSPAR) 10 MG tablet Take 1 tablet (10 mg total) by mouth 2 (two) times daily. 180 tablet 1   clonazePAM (KLONOPIN) 1 MG tablet TAKE 1 TABLET BY MOUTH ONCE DAILY AS NEEDED ANXIETY 10 tablet 0   fluticasone (FLONASE) 50 MCG/ACT nasal spray Place 2 sprays into both nostrils daily. Use for 4-6 weeks then stop and use seasonally or as needed. 16 g 0   losartan (COZAAR) 50 MG tablet Take 1 tablet (50 mg total) by mouth daily. 90 tablet 1   risperiDONE (RISPERDAL) 2 MG tablet TAKE 1 TABLET BY MOUTH AT BEDTIME 90 tablet 0   sildenafil (VIAGRA) 100 MG tablet TAKE 1/2 TO 1 TABLET BY MOUTH  ONCE DAILYAS DIRECTED 10 tablet 5   No current facility-administered medications for this visit.    No Known Allergies  Family History  Problem Relation Age of Onset   Healthy Mother    Cirrhosis Father    Anxiety disorder Father    Depression Father    Alcohol abuse Father    ADD / ADHD Brother    Kidney disease Neg Hx    Prostate cancer Neg Hx    Colon cancer Neg Hx     Social History   Socioeconomic History   Marital status: Single    Spouse name: Not on file   Number of children: 0   Years of education: Not on file   Highest education level: Some college, no degree  Occupational History   Not on file  Tobacco Use   Smoking status: Former    Current packs/day: 0.00    Types: Cigarettes    Quit date: 07/21/2012    Years since quitting: 11.2   Smokeless tobacco: Former   Tobacco comments:    quit 5 years /occasional dip  Vaping Use   Vaping status: Never Used  Substance and Sexual Activity   Alcohol use: Yes    Alcohol/week: 7.0 standard drinks of alcohol    Types: 7 Cans of beer per  week    Comment: socially   Drug use: Yes    Types: Marijuana    Comment: last used 2 weeks    Sexual activity: Not Currently  Other Topics Concern   Not on file  Social History Narrative   Not on file   Social Drivers of Health   Financial Resource Strain: Low Risk  (10/29/2018)   Overall Financial Resource Strain (CARDIA)    Difficulty of Paying Living Expenses: Not very hard  Food Insecurity: No Food Insecurity (10/29/2018)   Hunger Vital Sign    Worried About Running Out of Food in the Last Year: Never true    Ran Out of Food in the Last Year: Never true  Transportation Needs: No Transportation Needs (10/29/2018)   PRAPARE - Administrator, Civil Service (Medical): No    Lack of Transportation (Non-Medical): No  Physical Activity: Inactive (10/29/2018)   Exercise Vital Sign    Days of Exercise per Week: 0 days    Minutes of Exercise per Session: 0 min   Stress: Not on file  Social Connections: Unknown (10/29/2018)   Social Connection and Isolation Panel [NHANES]    Frequency of Communication with Friends and Family: Not on file    Frequency of Social Gatherings with Friends and Family: Not on file    Attends Religious Services: Never    Database administrator or Organizations: No    Attends Banker Meetings: Never    Marital Status: Never married  Intimate Partner Violence: Not At Risk (10/29/2018)   Humiliation, Afraid, Rape, and Kick questionnaire    Fear of Current or Ex-Partner: No    Emotionally Abused: No    Physically Abused: No    Sexually Abused: No     Constitutional: Denies fever, malaise, fatigue, headache or abrupt weight changes.  HEENT: Denies eye pain, eye redness, ear pain, ringing in the ears, wax buildup, runny nose, nasal congestion, bloody nose, or sore throat. Respiratory: Denies difficulty breathing, shortness of breath, cough or sputum production.   Cardiovascular: Denies chest pain, chest tightness, palpitations or swelling in the hands or feet.  Gastrointestinal: Patient reports intermittent diarrhea.  Denies abdominal pain, bloating, constipation,  or blood in the stool.  GU: Denies urgency, frequency, pain with urination, burning sensation, blood in urine, odor or discharge. Musculoskeletal: Pt reports chronic left side back pain. Denies decrease in range of motion, difficulty with gait, muscle pain or joint pain and swelling.  Skin: Denies redness, rashes, lesions or ulcercations.  Neurological: Patient reports insomnia.  Denies dizziness, difficulty with memory, difficulty with speech or problems with balance and coordination.  Psych: Patient has a history of anxiety and depression.  Denies SI/HI.  No other specific complaints in a complete review of systems (except as listed in HPI above).  Objective:   Physical Exam BP (!) 142/88 (BP Location: Left Arm, Patient Position: Sitting, Cuff Size:  Large)   Ht 6' (1.829 m)   Wt 201 lb 12.8 oz (91.5 kg)   BMI 27.37 kg/m   Wt Readings from Last 3 Encounters:  07/29/23 200 lb (90.7 kg)  05/13/23 196 lb (88.9 kg)  04/11/23 188 lb (85.3 kg)    General: Appears his stated age, overweight, in NAD. Skin: Warm, dry and intact.  HEENT: Head: normal shape and size; Eyes: sclera white, no icterus, conjunctiva pink, PERRLA and EOMs intact;  Cardiovascular: Normal rate and rhythm. S1,S2 noted.  No murmur, rubs or gallops noted. No JVD  or BLE edema. Pulmonary/Chest: Normal effort and positive vesicular breath sounds. No respiratory distress. No wheezes, rales or ronchi noted.  Abdomen: Soft and nontender.  Musculoskeletal: Strength 5/5 BLE.  No difficulty with gait.  Neurological: Alert and oriented. Cranial nerves II-XII grossly intact. Coordination normal.  Psychiatric: Mood and affect mild normal. Behavior is normal. Judgment and thought content normal.     BMET    Component Value Date/Time   NA 139 04/11/2023 1342   K 3.6 04/11/2023 1342   CL 99 04/11/2023 1342   CO2 28 04/11/2023 1342   GLUCOSE 104 04/11/2023 1342   BUN 9 04/11/2023 1342   CREATININE 1.16 04/11/2023 1342   CALCIUM 10.0 04/11/2023 1342   GFRNONAA 114 05/26/2020 0856   GFRAA 132 05/26/2020 0856    Lipid Panel     Component Value Date/Time   CHOL 187 04/11/2023 1342   TRIG 372 (H) 04/11/2023 1342   HDL 54 04/11/2023 1342   CHOLHDL 3.5 04/11/2023 1342   LDLCALC 83 04/11/2023 1342    CBC    Component Value Date/Time   WBC 7.6 04/11/2023 1342   RBC 5.00 04/11/2023 1342   HGB 16.2 04/11/2023 1342   HCT 45.7 04/11/2023 1342   PLT 275 04/11/2023 1342   MCV 91.4 04/11/2023 1342   MCH 32.4 04/11/2023 1342   MCHC 35.4 04/11/2023 1342   RDW 12.3 04/11/2023 1342   LYMPHSABS 1,408 05/26/2020 0856   EOSABS 31 05/26/2020 0856   BASOSABS 51 05/26/2020 0856    Hgb A1C Lab Results  Component Value Date   HGBA1C 4.9 04/11/2023             Assessment & Plan:    RTC in 6 months for your annual exam Nicki Reaper, NP

## 2023-10-13 NOTE — Assessment & Plan Note (Signed)
Avoid foods that trigger your IBS 

## 2023-10-13 NOTE — Assessment & Plan Note (Signed)
Continue ibuprofen as needed He will continue to see a chiropractor

## 2023-10-13 NOTE — Assessment & Plan Note (Signed)
Initial blood pressure elevated but manual repeat improved Continue losartan Reinforced DASH diet and exercise for weight loss C-Met today

## 2023-10-13 NOTE — Assessment & Plan Note (Signed)
Continue sildenafil as needed. 

## 2023-10-13 NOTE — Assessment & Plan Note (Signed)
Continue Risperdal Continue clonazepam as needed- avoid taking daily Support offered

## 2023-10-14 NOTE — Telephone Encounter (Signed)
Requested medication (s) are due for refill today- no  Requested medication (s) are on the active medication list -yes  Future visit scheduled -yes  Last refill: 10/13/23 #10  Notes to clinic: non delegated Rx  Requested Prescriptions  Pending Prescriptions Disp Refills   clonazePAM (KLONOPIN) 1 MG tablet [Pharmacy Med Name: CLONAZEPAM 1 MG TAB] 10 tablet     Sig: TAKE 1 TABLET PAIN ONCE EVERY DAY AS NEEDED ANXIETY     Not Delegated - Psychiatry: Anxiolytics/Hypnotics 2 Failed - 10/14/2023  8:33 AM      Failed - This refill cannot be delegated      Failed - Urine Drug Screen completed in last 360 days      Passed - Patient is not pregnant      Passed - Valid encounter within last 6 months    Recent Outpatient Visits           Yesterday Hypertriglyceridemia   [place] [facility] [place], Salvadore Oxford, NP   2 months ago Viral conjunctivitis of right eye   [place] [facility] [place], [place] W, NP   5 months ago Bipolar 2 disorder, major depressive episode [facility] [place])   [place] [facility] [place], Salvadore Oxford, NP   6 months ago Encounter for general adult medical examination with abnormal findings   [place] [facility] [place], Salvadore Oxford, NP   10 months ago Bronchitis   [place] [facility] Smitty Cords, DO       Future Appointments             In 6 months Baity, Salvadore Oxford, NP [place] [facility]               Requested Prescriptions  Pending Prescriptions Disp Refills   clonazePAM (KLONOPIN) 1 MG tablet [Pharmacy Med Name: CLONAZEPAM 1 MG TAB] 10 tablet     Sig: TAKE 1 TABLET PAIN ONCE EVERY DAY AS NEEDED ANXIETY     Not Delegated - Psychiatry: Anxiolytics/Hypnotics 2 Failed - 10/14/2023  8:33 AM      Failed - This refill cannot be delegated      Failed - Urine Drug Screen completed in last 360 days      Passed - Patient is not pregnant       Passed - Valid encounter within last 6 months    Recent Outpatient Visits           Yesterday Hypertriglyceridemia   [place] [facility] [place], Salvadore Oxford, NP   2 months ago Viral conjunctivitis of right eye   [place] [facility] [place], [place], NP   5 months ago Bipolar 2 disorder, major depressive episode [facility])   [facility] [place], Salvadore Oxford, NP   6 months ago Encounter for general adult medical examination with abnormal findings   [place] [facility] [place], Salvadore Oxford, NP   10 months ago Bronchitis   [place] [facility] Smitty Cords, DO       Future Appointments             In 6 months Baity, Salvadore Oxford, NP  [facility]

## 2024-01-17 ENCOUNTER — Other Ambulatory Visit: Payer: Self-pay | Admitting: Internal Medicine

## 2024-01-20 NOTE — Telephone Encounter (Signed)
 Requested medication (s) are due for refill today -yes  Requested medication (s) are on the active medication list -yes  Future visit scheduled -yes  Last refill: 10/07/23 #90   Notes to clinic: non delegated Rx  Requested Prescriptions  Pending Prescriptions Disp Refills   risperiDONE (RISPERDAL) 2 MG tablet [Pharmacy Med Name: RISPERIDONE 2 MG TAB] 90 tablet 0    Sig: TAKE 1 TABLET BY MOUTH AT BEDTIME     Not Delegated - Psychiatry:  Antipsychotics - Second Generation (Atypical) - risperidone Failed - 01/20/2024 10:46 AM      Failed - This refill cannot be delegated      Failed - TSH in normal range and within 360 days    TSH  Date Value Ref Range Status  05/26/2020 0.71 0.40 - 4.50 mIU/L Final         Failed - Valid encounter within last 6 months    Recent Outpatient Visits   None     Future Appointments             In 2 months Baity, Salvadore Oxford, NP [place] [facility]            Failed - Lipid Panel in normal range within the last 12 months    Cholesterol  Date Value Ref Range Status  04/11/2023 187 <200 mg/dL Final   LDL Cholesterol (Calc)  Date Value Ref Range Status  04/11/2023 83 mg/dL (calc) Final    Comment:    Reference range: <100 . Desirable range <100 mg/dL for primary prevention;   <70 mg/dL for patients with CHD or diabetic patients  with > or = 2 CHD risk factors. [name] LDL-C is now calculated using the Martin-Hopkins  calculation, which is a validated novel method providing  better accuracy than the Friedewald equation in the  estimation of LDL-C.  Horald Pollen et al. Lenox Ahr. 0981;191(47): 2061-2068  (http://education.[facility].com/faq/FAQ164)    HDL  Date Value Ref Range Status  04/11/2023 54 > OR = 40 mg/dL Final   Triglycerides  Date Value Ref Range Status  04/11/2023 372 (H) <150 mg/dL Final    Comment:    . If a non-fasting specimen was collected, consider repeat triglyceride testing on a fasting  specimen if clinically indicated.  Perry Mount et al. J. of Clin. Lipidol. 2015;9:129-169. .          Failed - CBC within normal limits and completed in the last 12 months    WBC  Date Value Ref Range Status  04/11/2023 7.6 3.8 - 10.8 Thousand/uL Final   RBC  Date Value Ref Range Status  04/11/2023 5.00 4.20 - 5.80 Million/uL Final   Hemoglobin  Date Value Ref Range Status  04/11/2023 16.2 13.2 - 17.1 g/dL Final   HCT  Date Value Ref Range Status  04/11/2023 45.7 38.5 - 50.0 % Final   MCHC  Date Value Ref Range Status  04/11/2023 35.4 32.0 - 36.0 g/dL Final   [facility]  Date Value Ref Range Status  04/11/2023 32.4 27.0 - 33.0 pg Final   MCV  Date Value Ref Range Status  04/11/2023 91.4 80.0 - 100.0 fL Final   No results found for: "PLTCOUNTKUC", "LABPLAT", "POCPLA" RDW  Date Value Ref Range Status  04/11/2023 12.3 11.0 - 15.0 % Final         Failed - CMP within normal limits and completed in the last 12 months    No results found for: "ALBUMIN", "ALBMS", "POCALB", "ALBUMINELP"  Alkaline phosphatase (APISO)  Date Value Ref Range Status  04/11/2023 78 36 - 130 U/L Final   ALT  Date Value Ref Range Status  04/11/2023 93 (H) 9 - 46 U/L Final   AST  Date Value Ref Range Status  04/11/2023 64 (H) 10 - 40 U/L Final   BUN  Date Value Ref Range Status  04/11/2023 9 7 - 25 mg/dL Final   Calcium  Date Value Ref Range Status  04/11/2023 10.0 8.6 - 10.3 mg/dL Final   CO2  Date Value Ref Range Status  04/11/2023 28 20 - 32 mmol/L Final   Creat  Date Value Ref Range Status  04/11/2023 1.16 0.60 - 1.26 mg/dL Final   Glucose, Bld  Date Value Ref Range Status  04/11/2023 104 65 - 139 mg/dL Final    Comment:    .        Non-fasting reference interval .    Potassium  Date Value Ref Range Status  04/11/2023 3.6 3.5 - 5.3 mmol/L Final   Sodium  Date Value Ref Range Status  04/11/2023 139 135 - 146 mmol/L Final   Total Bilirubin  Date Value Ref Range Status   04/11/2023 1.6 (H) 0.2 - 1.2 mg/dL Final   Protein, UA  Date Value Ref Range Status  06/11/2018 Negative Negative Final  11/28/2015 Negative Negative/Trace Final   Total Protein  Date Value Ref Range Status  04/11/2023 7.2 6.1 - 8.1 g/dL Final   GFR, Est African American  Date Value Ref Range Status  05/26/2020 132 > OR = 60 mL/min/1.36m2 Final   eGFR  Date Value Ref Range Status  04/11/2023 86 > OR = 60 mL/min/1.5m2 Final   GFR, Est Non African American  Date Value Ref Range Status  05/26/2020 114 > OR = 60 mL/min/1.74m2 Final         Passed - Completed PHQ-2 or PHQ-9 in the last 360 days      Passed - Last BP in normal range    BP Readings from Last 1 Encounters:  10/13/23 136/74         Passed - Last Heart Rate in normal range    Pulse Readings from Last 1 Encounters:  07/29/23 (!) 107            Requested Prescriptions  Pending Prescriptions Disp Refills   risperiDONE (RISPERDAL) 2 MG tablet [Pharmacy Med Name: RISPERIDONE 2 MG TAB] 90 tablet 0    Sig: TAKE 1 TABLET BY MOUTH AT BEDTIME     Not Delegated - Psychiatry:  Antipsychotics - Second Generation (Atypical) - risperidone Failed - 01/20/2024 10:46 AM      Failed - This refill cannot be delegated      Failed - TSH in normal range and within 360 days    TSH  Date Value Ref Range Status  05/26/2020 0.71 0.40 - 4.50 mIU/L Final         Failed - Valid encounter within last 6 months    Recent Outpatient Visits   None     Future Appointments             In 2 months Baity, Salvadore Oxford, NP [place] [facility]            Failed - Lipid Panel in normal range within the last 12 months    Cholesterol  Date Value Ref Range Status  04/11/2023 187 <200 mg/dL Final   LDL Cholesterol (Calc)  Date Value  Ref Range Status  04/11/2023 83 mg/dL (calc) Final    Comment:    Reference range: <100 . Desirable range <100 mg/dL for primary prevention;   <70 mg/dL for patients with  CHD or diabetic patients  with > or = 2 CHD risk factors. [name] LDL-C is now calculated using the Martin-Hopkins  calculation, which is a validated novel method providing  better accuracy than the Friedewald equation in the  estimation of LDL-C.  Horald Pollen et al. Lenox Ahr. 1610;960(45): 2061-2068  (http://education.[facility].com/faq/FAQ164)    HDL  Date Value Ref Range Status  04/11/2023 54 > OR = 40 mg/dL Final   Triglycerides  Date Value Ref Range Status  04/11/2023 372 (H) <150 mg/dL Final    Comment:    . If a non-fasting specimen was collected, consider repeat triglyceride testing on a fasting specimen if clinically indicated.  Perry Mount et al. J. of Clin. Lipidol. 2015;9:129-169. .          Failed - CBC within normal limits and completed in the last 12 months    WBC  Date Value Ref Range Status  04/11/2023 7.6 3.8 - 10.8 Thousand/uL Final   RBC  Date Value Ref Range Status  04/11/2023 5.00 4.20 - 5.80 Million/uL Final   Hemoglobin  Date Value Ref Range Status  04/11/2023 16.2 13.2 - 17.1 g/dL Final   HCT  Date Value Ref Range Status  04/11/2023 45.7 38.5 - 50.0 % Final   MCHC  Date Value Ref Range Status  04/11/2023 35.4 32.0 - 36.0 g/dL Final   [facility]  Date Value Ref Range Status  04/11/2023 32.4 27.0 - 33.0 pg Final   MCV  Date Value Ref Range Status  04/11/2023 91.4 80.0 - 100.0 fL Final   No results found for: "PLTCOUNTKUC", "LABPLAT", "POCPLA" RDW  Date Value Ref Range Status  04/11/2023 12.3 11.0 - 15.0 % Final         Failed - CMP within normal limits and completed in the last 12 months    No results found for: "ALBUMIN", "ALBMS", "POCALB", "ALBUMINELP" Alkaline phosphatase (APISO)  Date Value Ref Range Status  04/11/2023 78 36 - 130 U/L Final   ALT  Date Value Ref Range Status  04/11/2023 93 (H) 9 - 46 U/L Final   AST  Date Value Ref Range Status  04/11/2023 64 (H) 10 - 40 U/L Final   BUN  Date Value Ref Range Status   04/11/2023 9 7 - 25 mg/dL Final   Calcium  Date Value Ref Range Status  04/11/2023 10.0 8.6 - 10.3 mg/dL Final   CO2  Date Value Ref Range Status  04/11/2023 28 20 - 32 mmol/L Final   Creat  Date Value Ref Range Status  04/11/2023 1.16 0.60 - 1.26 mg/dL Final   Glucose, Bld  Date Value Ref Range Status  04/11/2023 104 65 - 139 mg/dL Final    Comment:    .        Non-fasting reference interval .    Potassium  Date Value Ref Range Status  04/11/2023 3.6 3.5 - 5.3 mmol/L Final   Sodium  Date Value Ref Range Status  04/11/2023 139 135 - 146 mmol/L Final   Total Bilirubin  Date Value Ref Range Status  04/11/2023 1.6 (H) 0.2 - 1.2 mg/dL Final   Protein, UA  Date Value Ref Range Status  06/11/2018 Negative Negative Final  11/28/2015 Negative Negative/Trace Final   Total Protein  Date Value Ref Range Status  04/11/2023 7.2 6.1 - 8.1 g/dL Final   GFR, Est African American  Date Value Ref Range Status  05/26/2020 132 > OR = 60 mL/min/1.43m2 Final   eGFR  Date Value Ref Range Status  04/11/2023 86 > OR = 60 mL/min/1.51m2 Final   GFR, Est Non African American  Date Value Ref Range Status  05/26/2020 114 > OR = 60 mL/min/1.35m2 Final         Passed - Completed PHQ-2 or PHQ-9 in the last 360 days      Passed - Last BP in normal range    BP Readings from Last 1 Encounters:  10/13/23 136/74         Passed - Last Heart Rate in normal range    Pulse Readings from Last 1 Encounters:  07/29/23 (!) 107

## 2024-02-09 ENCOUNTER — Other Ambulatory Visit: Payer: Self-pay | Admitting: Internal Medicine

## 2024-02-09 DIAGNOSIS — F411 Generalized anxiety disorder: Secondary | ICD-10-CM

## 2024-02-09 DIAGNOSIS — F410 Panic disorder [episodic paroxysmal anxiety] without agoraphobia: Secondary | ICD-10-CM

## 2024-02-10 MED ORDER — CLONAZEPAM 1 MG PO TABS: ORAL_TABLET | ORAL | 0 refills | Status: DC

## 2024-03-20 DIAGNOSIS — K529 Noninfective gastroenteritis and colitis, unspecified: Secondary | ICD-10-CM | POA: Diagnosis not present

## 2024-04-12 ENCOUNTER — Other Ambulatory Visit: Payer: Self-pay | Admitting: Internal Medicine

## 2024-04-13 ENCOUNTER — Ambulatory Visit (INDEPENDENT_AMBULATORY_CARE_PROVIDER_SITE_OTHER): Payer: Self-pay | Admitting: Internal Medicine

## 2024-04-13 ENCOUNTER — Encounter: Payer: Self-pay | Admitting: Internal Medicine

## 2024-04-13 VITALS — BP 138/84 | Ht 72.0 in | Wt 192.0 lb

## 2024-04-13 DIAGNOSIS — Z136 Encounter for screening for cardiovascular disorders: Secondary | ICD-10-CM

## 2024-04-13 DIAGNOSIS — K589 Irritable bowel syndrome without diarrhea: Secondary | ICD-10-CM

## 2024-04-13 DIAGNOSIS — E663 Overweight: Secondary | ICD-10-CM | POA: Diagnosis not present

## 2024-04-13 DIAGNOSIS — Z6826 Body mass index (BMI) 26.0-26.9, adult: Secondary | ICD-10-CM | POA: Diagnosis not present

## 2024-04-13 DIAGNOSIS — R739 Hyperglycemia, unspecified: Secondary | ICD-10-CM | POA: Diagnosis not present

## 2024-04-13 DIAGNOSIS — Z0001 Encounter for general adult medical examination with abnormal findings: Secondary | ICD-10-CM

## 2024-04-13 DIAGNOSIS — K219 Gastro-esophageal reflux disease without esophagitis: Secondary | ICD-10-CM

## 2024-04-13 MED ORDER — PANTOPRAZOLE SODIUM 40 MG PO TBEC: 40.0000 mg | DELAYED_RELEASE_TABLET | Freq: Every day | ORAL | 1 refills | Status: DC

## 2024-04-13 MED ORDER — ONDANSETRON 4 MG PO TBDP: 4.0000 mg | ORAL_TABLET | Freq: Three times a day (TID) | ORAL | 1 refills | Status: AC | PRN

## 2024-04-13 MED ORDER — LOSARTAN POTASSIUM 50 MG PO TABS: 50.0000 mg | ORAL_TABLET | Freq: Every day | ORAL | 1 refills | Status: AC

## 2024-04-13 MED ORDER — SILDENAFIL CITRATE 100 MG PO TABS: 100.0000 mg | ORAL_TABLET | ORAL | 5 refills | Status: AC | PRN

## 2024-04-13 NOTE — Patient Instructions (Signed)
 Health Maintenance, Male  Adopting a healthy lifestyle and getting preventive care are important in promoting health and wellness. Ask your health care provider about:  The right schedule for you to have regular tests and exams.  Things you can do on your own to prevent diseases and keep yourself healthy.  What should I know about diet, weight, and exercise?  Eat a healthy diet    Eat a diet that includes plenty of vegetables, fruits, low-fat dairy products, and lean protein.  Do not eat a lot of foods that are high in solid fats, added sugars, or sodium.  Maintain a healthy weight  Body mass index (BMI) is a measurement that can be used to identify possible weight problems. It estimates body fat based on height and weight. Your health care provider can help determine your BMI and help you achieve or maintain a healthy weight.  Get regular exercise  Get regular exercise. This is one of the most important things you can do for your health. Most adults should:  Exercise for at least 150 minutes each week. The exercise should increase your heart rate and make you sweat (moderate-intensity exercise).  Do strengthening exercises at least twice a week. This is in addition to the moderate-intensity exercise.  Spend less time sitting. Even light physical activity can be beneficial.  Watch cholesterol and blood lipids  Have your blood tested for lipids and cholesterol at 32 years of age, then have this test every 5 years.  You may need to have your cholesterol levels checked more often if:  Your lipid or cholesterol levels are high.  You are older than 32 years of age.  You are at high risk for heart disease.  What should I know about cancer screening?  Many types of cancers can be detected early and may often be prevented. Depending on your health history and family history, you may need to have cancer screening at various ages. This may include screening for:  Colorectal cancer.  Prostate cancer.  Skin cancer.  Lung  cancer.  What should I know about heart disease, diabetes, and high blood pressure?  Blood pressure and heart disease  High blood pressure causes heart disease and increases the risk of stroke. This is more likely to develop in people who have high blood pressure readings or are overweight.  Talk with your health care provider about your target blood pressure readings.  Have your blood pressure checked:  Every 3-5 years if you are 9-95 years of age.  Every year if you are 85 years old or older.  If you are between the ages of 29 and 29 and are a current or former smoker, ask your health care provider if you should have a one-time screening for abdominal aortic aneurysm (AAA).  Diabetes  Have regular diabetes screenings. This checks your fasting blood sugar level. Have the screening done:  Once every three years after age 23 if you are at a normal weight and have a low risk for diabetes.  More often and at a younger age if you are overweight or have a high risk for diabetes.  What should I know about preventing infection?  Hepatitis B  If you have a higher risk for hepatitis B, you should be screened for this virus. Talk with your health care provider to find out if you are at risk for hepatitis B infection.  Hepatitis C  Blood testing is recommended for:  Everyone born from [birth year] through 1964.  Anyone  with known risk factors for hepatitis C.  Sexually transmitted infections (STIs)  You should be screened each year for STIs, including gonorrhea and chlamydia, if:  You are sexually active and are younger than 32 years of age.  You are older than 32 years of age and your health care provider tells you that you are at risk for this type of infection.  Your sexual activity has changed since you were last screened, and you are at increased risk for chlamydia or gonorrhea. Ask your health care provider if you are at risk.  Ask your health care provider about whether you are at high risk for HIV. Your health care provider  may recommend a prescription medicine to help prevent HIV infection. If you choose to take medicine to prevent HIV, you should first get tested for HIV. You should then be tested every 3 months for as long as you are taking the medicine.  Follow these instructions at home:  Alcohol use  Do not drink alcohol if your health care provider tells you not to drink.  If you drink alcohol:  Limit how much you have to 0-2 drinks a day.  Know how much alcohol is in your drink. In the [place] one drink equals one 12 oz bottle of beer (355 mL), one 5 oz glass of wine (148 mL), or one 1 oz glass of hard liquor (44 mL).  Lifestyle  Do not use any products that contain nicotine or tobacco. These products include cigarettes, chewing tobacco, and vaping devices, such as e-cigarettes. If you need help quitting, ask your health care provider.  Do not use street drugs.  Do not share needles.  Ask your health care provider for help if you need support or information about quitting drugs.  General instructions  Schedule regular health, dental, and eye exams.  Stay current with your vaccines.  Tell your health care provider if:  You often feel depressed.  You have ever been abused or do not feel safe at home.  Summary  Adopting a healthy lifestyle and getting preventive care are important in promoting health and wellness.  Follow your health care provider's instructions about healthy diet, exercising, and getting tested or screened for diseases.  Follow your health care provider's instructions on monitoring your cholesterol and blood pressure.  This information is not intended to replace advice given to you by your health care provider. Make sure you discuss any questions you have with your health care provider.  Document Revised: 02/26/2021 Document Reviewed: 02/26/2021  Elsevier Patient Education  2024 [employer].

## 2024-04-13 NOTE — Assessment & Plan Note (Signed)
 Encouraged diet and exercise for weight loss ?

## 2024-04-13 NOTE — Progress Notes (Signed)
 Subjective:    Patient ID: Dominic Barnes, male    DOB: [birth year], 33 y.o.   MRN: [id number]  HPI  Patient presents to clinic today for his annual exam.  He reports recent urgent care visit for gastroenteritis.  He reports over the last several months he has been throwing up daily.  He has a history of reflux and has been taking omeprazole 20 mg daily but does not feel like it has been helping.  He has a history of IBS with diarrhea and is not currently taking any medications for this.  He has been under extreme amounts of stress lately.  He would like further evaluation of this today.  Flu: 07/2023 Tetanus: 12/2021 COVID: Never Dentist: biannually  Diet: He does eat meat. He consumes fruits and veggies. He avoids fried foods. 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 1 episiode. Approx age 63.   Headache    sinus   Sinus trouble    Testicle pain     Current Outpatient Medications  Medication Sig Dispense Refill   clonazePAM  (KLONOPIN ) 1 MG tablet TAKE 1 TABLET BY MOUTH ONCE DAILY AS NEEDED ANXIETY 10 tablet 0   fluticasone  (FLONASE ) 50 MCG/ACT nasal spray Place 2 sprays into both nostrils daily. Use for 4-6 weeks then stop and use seasonally or as needed. 16 g 0   losartan  (COZAAR ) 50 MG tablet Take 1 tablet (50 mg total) by mouth daily. 90 tablet 1   risperiDONE  (RISPERDAL ) 2 MG tablet TAKE 1 TABLET BY MOUTH AT BEDTIME 90 tablet 0   sildenafil  (VIAGRA ) 100 MG tablet Take 1 tablet (100 mg total) by mouth as needed for erectile dysfunction. 10 tablet 5   No current facility-administered medications for this visit.    No Known Allergies  Family History  Problem Relation Age of Onset   Healthy Mother    Cirrhosis Father    Anxiety disorder Father    Depression Father    Alcohol abuse Father    ADD / ADHD  Brother    Kidney disease Neg Hx    Prostate cancer Neg Hx    Colon cancer Neg Hx     Social History   Socioeconomic History   Marital status: Single    Spouse name: Not on file   Number of children: 0   Years of education: Not on file   Highest education level: Some college, no degree  Occupational History   Not on file  Tobacco Use   Smoking status: Former    Current packs/day: 0.00    Types: Cigarettes    Quit date: 07/21/2012    Years since quitting: 11.7   Smokeless tobacco: Former   Tobacco comments:    quit 5 years /occasional dip  Vaping Use   Vaping status: Never Used  Substance and Sexual Activity   Alcohol use: Yes    Alcohol/week: 7.0 standard drinks of alcohol    Types: 7 Cans of beer per week    Comment: socially   Drug use: Yes    Types: Marijuana    Comment: last used 2 weeks    Sexual activity: Not Currently  Other Topics Concern   Not on file  Social History Narrative   Not on file   Social Drivers of Health  Financial Resource Strain: Low Risk  (10/29/2018)   Overall Financial Resource Strain (CARDIA)    Difficulty of Paying Living Expenses: Not very hard  Food Insecurity: No Food Insecurity (10/29/2018)   Hunger Vital Sign    Worried About Running Out of Food in the Last Year: Never true    Ran Out of Food in the Last Year: Never true  Transportation Needs: No Transportation Needs (10/29/2018)   PRAPARE - Administrator, Civil Service (Medical): No    Lack of Transportation (Non-Medical): No  Physical Activity: Inactive (10/29/2018)   Exercise Vital Sign    Days of Exercise per Week: 0 days    Minutes of Exercise per Session: 0 min  Stress: Not on file  Social Connections: Unknown (10/29/2018)   Social Connection and Isolation Panel    Frequency of Communication with Friends and Family: Not on file    Frequency of Social Gatherings with Friends and Family: Not on file    Attends Religious Services: Never    Active Member of Clubs or  Organizations: No    Attends Banker Meetings: Never    Marital Status: Never married  Intimate Partner Violence: Not At Risk (10/29/2018)   Humiliation, Afraid, Rape, and Kick questionnaire    Fear of Current or Ex-Partner: No    Emotionally Abused: No    Physically Abused: No    Sexually Abused: No     Constitutional: Denies fever, malaise, fatigue, headache or abrupt weight changes.  HEENT: Denies eye pain, eye redness, ear pain, ringing in the ears, wax buildup, runny nose, nasal congestion, bloody nose, or sore throat. Respiratory: Denies difficulty breathing, shortness of breath, cough or sputum production.   Cardiovascular: Denies chest pain, chest tightness, palpitations or swelling in the hands or feet.  Gastrointestinal: Pt reports intermittent reflux, diarrhea. Denies abdominal pain, bloating, constipation, or blood in the stool.  GU: Patient reports erectile dysfunction.  Pain denies urgency, frequency, pain with urination, burning sensation, blood in urine, odor or discharge. Musculoskeletal: Patient reports intermittent low back pain.  Denies decrease in range of motion, difficulty with gait, muscle pain or joint swelling.  Skin: Denies redness, rashes, lesions or ulcercations.  Neurological: Pt reports insomnia, difficulty focusing.  Denies dizziness, difficulty with memory, difficulty with speech or problems with balance and coordination.  Psych: Patient has a history of anxiety and depression.  Denies SI/HI.  No other specific complaints in a complete review of systems (except as listed in HPI above).     Objective:   Physical Exam  BP 138/84 (BP Location: Left Arm, Patient Position: Sitting, Cuff Size: Normal)   Ht 6' (1.829 m)   Wt 192 lb (87.1 kg)   BMI 26.04 kg/m    Wt Readings from Last 3 Encounters:  10/13/23 201 lb 12.8 oz (91.5 kg)  07/29/23 200 lb (90.7 kg)  05/13/23 196 lb (88.9 kg)    General: Appears his stated age, overweight, in  NAD. Skin: Warm, dry and intact.  HEENT: Head: normal shape and size; Eyes: sclera white, no icterus, conjunctiva pink, PERRLA and EOMs intact;  Neck:  Neck supple, trachea midline. No masses, lumps or thyromegaly present.  Cardiovascular: Tachycardic with normal rhythm. S1,S2 noted.  No murmur, rubs or gallops noted. No JVD or BLE edema.  Pulmonary/Chest: Normal effort and positive vesicular breath sounds. No respiratory distress. No wheezes, rales or ronchi noted.  Abdomen: Hyperactive bowel sounds.  Soft and nontender.  No distention or masses noted.  Musculoskeletal: Strength 5/5 BUE/BLE. No difficulty with gait.  Neurological: Alert and oriented. Cranial nerves II-XII grossly intact. Coordination normal.  Psychiatric: Mood and affect normal.  Anxious appearing. Judgment and thought content normal.    BMET    Component Value Date/Time   NA 139 04/11/2023 1342   K 3.6 04/11/2023 1342   CL 99 04/11/2023 1342   CO2 28 04/11/2023 1342   GLUCOSE 104 04/11/2023 1342   BUN 9 04/11/2023 1342   CREATININE 1.16 04/11/2023 1342   CALCIUM 10.0 04/11/2023 1342   GFRNONAA 114 05/26/2020 0856   GFRAA 132 05/26/2020 0856    Lipid Panel     Component Value Date/Time   CHOL 187 04/11/2023 1342   TRIG 372 (H) 04/11/2023 1342   HDL 54 04/11/2023 1342   CHOLHDL 3.5 04/11/2023 1342   LDLCALC 83 04/11/2023 1342    CBC    Component Value Date/Time   WBC 7.6 04/11/2023 1342   RBC 5.00 04/11/2023 1342   HGB 16.2 04/11/2023 1342   HCT 45.7 04/11/2023 1342   PLT 275 04/11/2023 1342   MCV 91.4 04/11/2023 1342   MCH 32.4 04/11/2023 1342   MCHC 35.4 04/11/2023 1342   RDW 12.3 04/11/2023 1342   LYMPHSABS 1,408 05/26/2020 0856   EOSABS 31 05/26/2020 0856   BASOSABS 51 05/26/2020 0856    Hgb A1C Lab Results  Component Value Date   HGBA1C 4.9 04/11/2023           Assessment & Plan:   Preventative health maintenance:  Encouraged him to get a flu shot the fall Tetanus  UTD Encouraged him to get his COVID-vaccine Encouraged him to consume a balanced diet and exercise regimen Advised him to see a dentist annually We will check CBC, c-Met, lipid, A1c today  GERD, IBS:  May benefit from being tested for H. pylori however given his chronic PPI use would recommend upper GI Discontinue omeprazole 20 mg OTC Rx for pantoprazole 40 mg daily Rx for zofran  4 mg every 8 hours as needed Referral to GI for further evaluation and treatment  RTC in 6 months, follow-up chronic conditions Angeline Laura, NP

## 2024-04-13 NOTE — Telephone Encounter (Signed)
 Requested medications are due for refill today.  yes  Requested medications are on the active medications list.  yes  Last refill. 01/20/2024 #90 0 rf  Future visit scheduled.   Yes - and pt had an OV today  Notes to clinic.  Refill not delegated.    Requested Prescriptions  Pending Prescriptions Disp Refills   risperiDONE  (RISPERDAL ) 2 MG tablet [Pharmacy Med Name: RISPERIDONE  2 MG TAB] 90 tablet 0    Sig: TAKE 1 TABLET BY MOUTH AT BEDTIME     Not Delegated - Psychiatry:  Antipsychotics - Second Generation (Atypical) - risperidone  Failed - 04/13/2024  4:40 PM      Failed - This refill cannot be delegated      Failed - TSH in normal range and within 360 days    TSH  Date Value Ref Range Status  05/26/2020 0.71 0.40 - 4.50 mIU/L Final         Failed - Lipid Panel in normal range within the last 12 months    Cholesterol  Date Value Ref Range Status  04/11/2023 187 <200 mg/dL Final   LDL Cholesterol (Calc)  Date Value Ref Range Status  04/11/2023 83 mg/dL (calc) Final    Comment:    Reference range: <100 . Desirable range <100 mg/dL for primary prevention;   <70 mg/dL for patients with CHD or diabetic patients  with > or = 2 CHD risk factors. SABRA LDL-C is now calculated using the Martin-Hopkins  calculation, which is a validated novel method providing  better accuracy than the Friedewald equation in the  estimation of LDL-C.  Gladis APPLETHWAITE et al. SANDREA. 7986;689(80): 2061-2068  (http://education.[facility].com/faq/FAQ164)    HDL  Date Value Ref Range Status  04/11/2023 54 > OR = 40 mg/dL Final   Triglycerides  Date Value Ref Range Status  04/11/2023 372 (H) <150 mg/dL Final    Comment:    . If a non-fasting specimen was collected, consider repeat triglyceride testing on a fasting specimen if clinically indicated.  Veatrice et al. J. of Clin. Lipidol. 2015;9:129-169. .          Failed - CBC within normal limits and completed in the last 12 months    WBC   Date Value Ref Range Status  04/11/2023 7.6 3.8 - 10.8 Thousand/uL Final   RBC  Date Value Ref Range Status  04/11/2023 5.00 4.20 - 5.80 Million/uL Final   Hemoglobin  Date Value Ref Range Status  04/11/2023 16.2 13.2 - 17.1 g/dL Final   HCT  Date Value Ref Range Status  04/11/2023 45.7 38.5 - 50.0 % Final   MCHC  Date Value Ref Range Status  04/11/2023 35.4 32.0 - 36.0 g/dL Final   [facility]  Date Value Ref Range Status  04/11/2023 32.4 27.0 - 33.0 pg Final   MCV  Date Value Ref Range Status  04/11/2023 91.4 80.0 - 100.0 fL Final   No results found for: PLTCOUNTKUC, LABPLAT, POCPLA RDW  Date Value Ref Range Status  04/11/2023 12.3 11.0 - 15.0 % Final         Failed - CMP within normal limits and completed in the last 12 months    No results found for: ALBUMIN, ALBMS, POCALB, ALBUMINELP Alkaline phosphatase (APISO)  Date Value Ref Range Status  04/11/2023 78 36 - 130 U/L Final   ALT  Date Value Ref Range Status  04/11/2023 93 (H) 9 - 46 U/L Final   AST  Date Value Ref Range Status  04/11/2023  64 (H) 10 - 40 U/L Final   BUN  Date Value Ref Range Status  04/11/2023 9 7 - 25 mg/dL Final   Calcium  Date Value Ref Range Status  04/11/2023 10.0 8.6 - 10.3 mg/dL Final   CO2  Date Value Ref Range Status  04/11/2023 28 20 - 32 mmol/L Final   Creat  Date Value Ref Range Status  04/11/2023 1.16 0.60 - 1.26 mg/dL Final   Glucose, Bld  Date Value Ref Range Status  04/11/2023 104 65 - 139 mg/dL Final    Comment:    .        Non-fasting reference interval .    Potassium  Date Value Ref Range Status  04/11/2023 3.6 3.5 - 5.3 mmol/L Final   Sodium  Date Value Ref Range Status  04/11/2023 139 135 - 146 mmol/L Final   Total Bilirubin  Date Value Ref Range Status  04/11/2023 1.6 (H) 0.2 - 1.2 mg/dL Final   Protein, UA  Date Value Ref Range Status  06/11/2018 Negative Negative Final  11/28/2015 Negative Negative/Trace Final   Total  Protein  Date Value Ref Range Status  04/11/2023 7.2 6.1 - 8.1 g/dL Final   GFR, Est African American  Date Value Ref Range Status  05/26/2020 132 > OR = 60 mL/min/1.10m2 Final   eGFR  Date Value Ref Range Status  04/11/2023 86 > OR = 60 mL/min/1.108m2 Final   GFR, Est Non African American  Date Value Ref Range Status  05/26/2020 114 > OR = 60 mL/min/1.91m2 Final         Passed - Completed PHQ-2 or PHQ-9 in the last 360 days      Passed - Last BP in normal range    BP Readings from Last 1 Encounters:  04/13/24 138/84         Passed - Last Heart Rate in normal range    Pulse Readings from Last 1 Encounters:  07/29/23 (!) 107         Passed - Valid encounter within last 6 months    Recent Outpatient Visits           Today Encounter for general adult medical examination with abnormal findings   [place] [facility] [place], Angeline ORN, NP

## 2024-04-14 ENCOUNTER — Ambulatory Visit: Payer: Self-pay | Admitting: Internal Medicine

## 2024-04-14 DIAGNOSIS — R748 Abnormal levels of other serum enzymes: Secondary | ICD-10-CM

## 2024-04-14 LAB — COMPREHENSIVE METABOLIC PANEL WITH GFR
AG Ratio: 1.9 (calc) (ref 1.0–2.5)
ALT: 69 U/L — ABNORMAL HIGH (ref 9–46)
AST: 63 U/L — ABNORMAL HIGH (ref 10–40)
Albumin: 4.7 g/dL (ref 3.6–5.1)
Alkaline phosphatase (APISO): 84 U/L (ref 36–130)
BUN/Creatinine Ratio: 7 (calc) (ref 6–22)
BUN: 6 mg/dL — ABNORMAL LOW (ref 7–25)
CO2: 26 mmol/L (ref 20–32)
Calcium: 9.3 mg/dL (ref 8.6–10.3)
Chloride: 98 mmol/L (ref 98–110)
Creat: 0.85 mg/dL (ref 0.60–1.26)
Globulin: 2.5 g/dL (ref 1.9–3.7)
Glucose, Bld: 97 mg/dL (ref 65–99)
Potassium: 3.9 mmol/L (ref 3.5–5.3)
Sodium: 135 mmol/L (ref 135–146)
Total Bilirubin: 1.7 mg/dL — ABNORMAL HIGH (ref 0.2–1.2)
Total Protein: 7.2 g/dL (ref 6.1–8.1)
eGFR: 118 mL/min/{1.73_m2} (ref >=60)

## 2024-04-14 LAB — CBC
HCT: 47 % (ref 38.5–50.0)
Hemoglobin: 16 g/dL (ref 13.2–17.1)
MCH: 32.7 pg (ref 27.0–33.0)
MCHC: 34 g/dL (ref 32.0–36.0)
MCV: 95.9 fL (ref 80.0–100.0)
MPV: 11.6 fL (ref 7.5–12.5)
Platelets: 199 10*3/uL (ref 140–400)
RBC: 4.9 10*6/uL (ref 4.20–5.80)
RDW: 12.9 % (ref 11.0–15.0)
WBC: 6.7 10*3/uL (ref 3.8–10.8)

## 2024-04-14 LAB — HEMOGLOBIN A1C
Hgb A1c MFr Bld: 5 % (ref <5.7)
Mean Plasma Glucose: 97 mg/dL
eAG (mmol/L): 5.4 mmol/L

## 2024-04-14 LAB — LIPID PANEL
Cholesterol: 200 mg/dL — ABNORMAL HIGH (ref <200)
HDL: 69 mg/dL (ref >=40)
LDL Cholesterol (Calc): 106 mg/dL — ABNORMAL HIGH
Non-HDL Cholesterol (Calc): 131 mg/dL — ABNORMAL HIGH (ref <130)
Total CHOL/HDL Ratio: 2.9 (calc) (ref <5.0)
Triglycerides: 131 mg/dL (ref <150)

## 2024-04-16 ENCOUNTER — Other Ambulatory Visit: Payer: Self-pay | Admitting: Internal Medicine

## 2024-04-16 DIAGNOSIS — F410 Panic disorder [episodic paroxysmal anxiety] without agoraphobia: Secondary | ICD-10-CM

## 2024-04-16 DIAGNOSIS — F411 Generalized anxiety disorder: Secondary | ICD-10-CM

## 2024-04-19 ENCOUNTER — Ambulatory Visit: Payer: Self-pay | Admitting: Internal Medicine

## 2024-04-19 ENCOUNTER — Ambulatory Visit
Admission: RE | Admit: 2024-04-19 | Discharge: 2024-04-19 | Disposition: A | Discharge status: Home / Self Care | Source: Ambulatory Visit | Attending: Internal Medicine | Admitting: Internal Medicine

## 2024-04-19 DIAGNOSIS — R748 Abnormal levels of other serum enzymes: Secondary | ICD-10-CM | POA: Insufficient documentation

## 2024-04-19 NOTE — Telephone Encounter (Signed)
 Requested medication (s) are due for refill today: yes  Requested medication (s) are on the active medication list: yes  Last refill:  02/10/24 #10  Future visit scheduled: yes  Notes to clinic:  med not delegated to NT to RF   Requested Prescriptions  Pending Prescriptions Disp Refills   clonazePAM  (KLONOPIN ) 1 MG tablet [Pharmacy Med Name: CLONAZEPAM  1 MG TAB] 10 tablet     Sig: TAKE 1 TABLET BY MOUTH ONCE DAILY AS NEEDED FOR ANXIETY     Not Delegated - Psychiatry: Anxiolytics/Hypnotics 2 Failed - 04/19/2024 11:58 AM      Failed - This refill cannot be delegated      Failed - Urine Drug Screen completed in last 360 days      Passed - Patient is not pregnant      Passed - Valid encounter within last 6 months    Recent Outpatient Visits           6 days ago Encounter for general adult medical examination with abnormal findings   [place] [facility] [place], Angeline ORN, NP

## 2024-04-26 ENCOUNTER — Encounter: Payer: Self-pay | Admitting: Internal Medicine

## 2024-07-20 ENCOUNTER — Other Ambulatory Visit: Payer: Self-pay | Admitting: Internal Medicine

## 2024-07-22 NOTE — Telephone Encounter (Signed)
 Requested medication (s) are due for refill today: yes  Requested medication (s) are on the active medication list: yes  Last refill:  04/13/24  Future visit scheduled: yes  Notes to clinic:  Unable to refill per protocol, cannot delegate.      Requested Prescriptions  Pending Prescriptions Disp Refills   risperiDONE  (RISPERDAL ) 2 MG tablet [Pharmacy Med Name: RISPERIDONE  2 MG TAB] 90 tablet 0    Sig: TAKE 1 TABLET BY MOUTH AT BEDTIME     Not Delegated - Psychiatry:  Antipsychotics - Second Generation (Atypical) - risperidone  Failed - 07/22/2024  1:18 PM      Failed - This refill cannot be delegated      Failed - TSH in normal range and within 360 days    TSH  Date Value Ref Range Status  05/26/2020 0.71 0.40 - 4.50 mIU/L Final         Failed - Lipid Panel in normal range within the last 12 months    Cholesterol  Date Value Ref Range Status  04/13/2024 200 (H) <200 mg/dL Final   LDL Cholesterol (Calc)  Date Value Ref Range Status  04/13/2024 106 (H) mg/dL (calc) Final    Comment:    Reference range: <100 . Desirable range <100 mg/dL for primary prevention;   <70 mg/dL for patients with CHD or diabetic patients  with > or = 2 CHD risk factors. SABRA LDL-C is now calculated using the Martin-Hopkins  calculation, which is a validated novel method providing  better accuracy than the Friedewald equation in the  estimation of LDL-C.  Dominic Barnes et al. SANDREA. 7986;689(80): 2061-2068  (http://education.[facility].com/faq/FAQ164)    HDL  Date Value Ref Range Status  04/13/2024 69 > OR = 40 mg/dL Final   Triglycerides  Date Value Ref Range Status  04/13/2024 131 <150 mg/dL Final         Failed - CBC within normal limits and completed in the last 12 months    WBC  Date Value Ref Range Status  04/13/2024 6.7 3.8 - 10.8 Thousand/uL Final   RBC  Date Value Ref Range Status  04/13/2024 4.90 4.20 - 5.80 Million/uL Final   Hemoglobin  Date Value Ref Range Status   04/13/2024 16.0 13.2 - 17.1 g/dL Final   HCT  Date Value Ref Range Status  04/13/2024 47.0 38.5 - 50.0 % Final   MCHC  Date Value Ref Range Status  04/13/2024 34.0 32.0 - 36.0 g/dL Final    Comment:    For adults, a slight decrease in the calculated MCHC value (in the range of 30 to 32 g/dL) is most likely not clinically significant; however, it should be interpreted with caution in correlation with other red cell parameters and the patient's clinical condition.    [facility]  Date Value Ref Range Status  04/13/2024 32.7 27.0 - 33.0 pg Final   MCV  Date Value Ref Range Status  04/13/2024 95.9 80.0 - 100.0 fL Final   No results found for: PLTCOUNTKUC, LABPLAT, POCPLA RDW  Date Value Ref Range Status  04/13/2024 12.9 11.0 - 15.0 % Final         Passed - Completed PHQ-2 or PHQ-9 in the last 360 days      Passed - Last BP in normal range    BP Readings from Last 1 Encounters:  04/13/24 138/84         Passed - Last Heart Rate in normal range    Pulse Readings from Last 1  Encounters:  07/29/23 (!) 107         Passed - Valid encounter within last 6 months    Recent Outpatient Visits           3 months ago Encounter for general adult medical examination with abnormal findings   [facility], Angeline ORN, NP              Passed - CMP within normal limits and completed in the last 12 months    No results found for: ALBUMIN, ALBMS, POCALB, ALBUMINELP Alkaline phosphatase (APISO)  Date Value Ref Range Status  04/13/2024 84 36 - 130 U/L Final   ALT  Date Value Ref Range Status  04/13/2024 69 (H) 9 - 46 U/L Final   AST  Date Value Ref Range Status  04/13/2024 63 (H) 10 - 40 U/L Final   BUN  Date Value Ref Range Status  04/13/2024 6 (L) 7 - 25 mg/dL Final   Calcium  Date Value Ref Range Status  04/13/2024 9.3 8.6 - 10.3 mg/dL Final   CO2  Date Value Ref Range Status  04/13/2024 26 20 - 32 mmol/L Final   Creat   Date Value Ref Range Status  04/13/2024 0.85 0.60 - 1.26 mg/dL Final   Glucose, Bld  Date Value Ref Range Status  04/13/2024 97 65 - 99 mg/dL Final    Comment:    .            Fasting reference interval .    Potassium  Date Value Ref Range Status  04/13/2024 3.9 3.5 - 5.3 mmol/L Final   Sodium  Date Value Ref Range Status  04/13/2024 135 135 - 146 mmol/L Final   Total Bilirubin  Date Value Ref Range Status  04/13/2024 1.7 (H) 0.2 - 1.2 mg/dL Final   Protein, UA  Date Value Ref Range Status  06/11/2018 Negative Negative Final  11/28/2015 Negative Negative/Trace Final   Total Protein  Date Value Ref Range Status  04/13/2024 7.2 6.1 - 8.1 g/dL Final   GFR, Est African American  Date Value Ref Range Status  05/26/2020 132 > OR = 60 mL/min/1.79m2 Final   eGFR  Date Value Ref Range Status  04/13/2024 118 > OR = 60 mL/min/1.66m2 Final   GFR, Est Non African American  Date Value Ref Range Status  05/26/2020 114 > OR = 60 mL/min/1.32m2 Final

## 2024-08-05 ENCOUNTER — Ambulatory Visit: Payer: Self-pay

## 2024-08-05 ENCOUNTER — Telehealth: Admitting: Internal Medicine

## 2024-08-05 ENCOUNTER — Encounter: Payer: Self-pay | Admitting: Internal Medicine

## 2024-08-05 DIAGNOSIS — R5383 Other fatigue: Secondary | ICD-10-CM | POA: Diagnosis not present

## 2024-08-05 DIAGNOSIS — F419 Anxiety disorder, unspecified: Secondary | ICD-10-CM

## 2024-08-05 DIAGNOSIS — R6889 Other general symptoms and signs: Secondary | ICD-10-CM

## 2024-08-05 DIAGNOSIS — F3181 Bipolar II disorder: Secondary | ICD-10-CM

## 2024-08-05 DIAGNOSIS — R519 Headache, unspecified: Secondary | ICD-10-CM

## 2024-08-05 DIAGNOSIS — F319 Bipolar disorder, unspecified: Secondary | ICD-10-CM | POA: Diagnosis not present

## 2024-08-05 DIAGNOSIS — F411 Generalized anxiety disorder: Secondary | ICD-10-CM

## 2024-08-05 DIAGNOSIS — F410 Panic disorder [episodic paroxysmal anxiety] without agoraphobia: Secondary | ICD-10-CM

## 2024-08-05 DIAGNOSIS — N522 Drug-induced erectile dysfunction: Secondary | ICD-10-CM

## 2024-08-05 DIAGNOSIS — R509 Fever, unspecified: Secondary | ICD-10-CM

## 2024-08-05 MED ORDER — CLONAZEPAM 1 MG PO TABS
1.0000 mg | ORAL_TABLET | Freq: Every day | ORAL | 0 refills | Status: AC | PRN
Start: 2024-08-05 — End: ?

## 2024-08-05 MED ORDER — PREDNISONE 20 MG PO TABS: 20.0000 mg | ORAL_TABLET | Freq: Every day | ORAL | 0 refills | Status: AC

## 2024-08-05 NOTE — Telephone Encounter (Signed)
 FYI Only or Action Required?: FYI only for provider.  Patient was last seen in primary care on 04/13/2024 by Antonette Angeline ORN, NP.  Called Nurse Triage reporting Fatigue.  Symptoms began several days ago.  Interventions attempted: OTC medications: Mucinex   and Rest, hydration, or home remedies.  Symptoms are: stable.  Triage Disposition: See Physician Within 24 Hours  Patient/caregiver understands and will follow disposition?: Yes Reason for Disposition  [1] MODERATE weakness (e.g., interferes with work, school, normal activities) AND [2] persists > 3 days  Answer Assessment - Initial Assessment Questions Covid home test negative. Taking Mucinex  cold and sinus, nothing else OTC. Patient stated he has to be feeling really really terrible to take something OTC.  1. DESCRIPTION: Describe how you are feeling.     Head cold feeling Monday, Wednesday started throwing up, couldn't keep anything down. Tried to go to work today but feels so fatigued, sedated almost.  2. SEVERITY: How bad is it?  Can you stand and walk?     Felt really tired Sunday night, Monday AM couldn't go into work, felt like he had a head cold, progressed into fatigue  3. ONSET: When did these symptoms begin? (e.g., hours, days, weeks, months)     Sunday night   4. CAUSE: What do you think is causing the weakness or fatigue? (e.g., not drinking enough fluids, medical problem, trouble sleeping)     Unsure  5. NEW MEDICINES:  Have you started on any new medicines recently? (e.g., opioid pain medicines, benzodiazepines, muscle relaxants, antidepressants, antihistamines, neuroleptics, beta blockers)     Denies  6. OTHER SYMPTOMS: Do you have any other symptoms? (e.g., chest pain, fever, cough, SOB, vomiting, diarrhea, bleeding, other areas of pain)     Really really tired, dizzy, runny nose, constantly blowing nose, vomiting and diarrhea (which as subsided today), has no appetite but drinking lots of fluids /  electrolyte.  Protocols used: Weakness (Generalized) and Fatigue-A-AH  Copied from CRM #[id number]. Topic: Clinical - Red Word Triage >> Aug 05, 2024 11:39 AM Shanda MATSU wrote: Red Word that prompted transfer to Nurse Triage: Patient is reporting extreme fatigue.

## 2024-08-05 NOTE — Patient Instructions (Signed)

## 2024-08-05 NOTE — Progress Notes (Signed)
 Virtual Visit via Video Note  I connected with Dominic Barnes on 08/05/24 at  3:00 PM EDT by a video enabled telemedicine application and verified that I am speaking with the correct person using two identifiers.  Location: Patient: Work Provider: Engineer, structural in this video call: Angeline Laura, NP-C and Dominic Barnes   I discussed the limitations of evaluation and management by telemedicine and the availability of in person appointments. The patient expressed understanding and agreed to proceed.  History of Present Illness:   Discussed the use of AI scribe software for clinical note transcription with the patient, who gave verbal consent to proceed.  Dominic Barnes is a 33 year old male who presents with fatigue following a viral illness.  He is on day four of feeling unwell, initially experiencing sinus congestion, runny nose, nasal congestion, sore throat, cough, nausea, vomiting, and diarrhea. These symptoms have resolved, but he now experiences significant fatigue.  He had a fever for about a day and took Mucinex  cold and sinus for symptom relief.   A COVID test taken yesterday was negative. He reports that he has not felt this bad before, and his symptoms have persisted longer than usual.  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 He interacts with many people at work but is not aware of anyone around him being sick.  He takes clonazepam  for anxiety and panic symptoms and has been prescribed risperdal  in the past for bipolar depression.  He would like a refill on his clonazepam  and is considering scheduling another appointment to discuss changing from risperdal  to lamotrigine  because he feels like the Risperdal  causes erectile dysfunction.  He is not currently seeing a psychiatrist but has seen 1 in the past.     Past Medical History:  Diagnosis Date   Anxiety     Arthritis    right elbow before surgery   Asthma    exercise induced. 1 episiode. Approx age 49.   Headache    sinus   Sinus trouble    Testicle pain     Current Outpatient Medications  Medication Sig Dispense Refill   clonazePAM  (KLONOPIN ) 1 MG tablet TAKE 1 TABLET BY MOUTH ONCE DAILY AS NEEDED FOR ANXIETY 10 tablet 0   losartan  (COZAAR ) 50 MG tablet Take 1 tablet (50 mg total) by mouth daily. 90 tablet 1   ondansetron  (ZOFRAN -ODT) 4 MG disintegrating tablet Take 1 tablet (4 mg total) by mouth every 8 (eight) hours as needed for nausea or vomiting. 30 tablet 1   pantoprazole  (PROTONIX ) 40 MG tablet Take 1 tablet (40 mg total) by mouth daily. 90 tablet 1   risperiDONE  (RISPERDAL ) 2 MG tablet TAKE 1 TABLET BY MOUTH AT BEDTIME 90 tablet 0   sildenafil  (VIAGRA ) 100 MG tablet Take 1 tablet (100 mg total) by mouth as needed for erectile dysfunction. 10 tablet 5   No current facility-administered medications for this visit.    No Known Allergies  Family History  Problem Relation Age of Onset   Healthy Mother    Cirrhosis Father    Anxiety disorder Father    Depression Father    Alcohol abuse Father    ADD / ADHD Brother    Kidney disease Neg Hx    Prostate cancer Neg Hx    Colon cancer Neg Hx     Social History  Socioeconomic History   Marital status: Single    Spouse name: Not on file   Number of children: 0   Years of education: Not on file   Highest education level: Some college, no degree  Occupational History   Not on file  Tobacco Use   Smoking status: Former    Current packs/day: 0.00    Types: Cigarettes    Quit date: 07/21/2012    Years since quitting: 12.0   Smokeless tobacco: Former   Tobacco comments:    quit 5 years /occasional dip  Vaping Use   Vaping status: Never Used  Substance and Sexual Activity   Alcohol use: Yes    Alcohol/week: 7.0 standard drinks of alcohol    Types: 7 Cans of beer per week    Comment: socially   Drug use: Yes    Types:  Marijuana    Comment: last used 2 weeks    Sexual activity: Not Currently  Other Topics Concern   Not on file  Social History Narrative   Not on file   Social Drivers of Health   Financial Resource Strain: Low Risk  (10/29/2018)   Overall Financial Resource Strain (CARDIA)    Difficulty of Paying Living Expenses: Not very hard  Food Insecurity: No Food Insecurity (10/29/2018)   Hunger Vital Sign    Worried About Running Out of Food in the Last Year: Never true    Ran Out of Food in the Last Year: Never true  Transportation Needs: No Transportation Needs (10/29/2018)   PRAPARE - Administrator, Civil Service (Medical): No    Lack of Transportation (Non-Medical): No  Physical Activity: Inactive (10/29/2018)   Exercise Vital Sign    Days of Exercise per Week: 0 days    Minutes of Exercise per Session: 0 min  Stress: Not on file  Social Connections: Unknown (10/29/2018)   Social Connection and Isolation Panel    Frequency of Communication with Friends and Family: Not on file    Frequency of Social Gatherings with Friends and Family: Not on file    Attends Religious Services: Never    Active Member of Clubs or Organizations: No    Attends Banker Meetings: Never    Marital Status: Never married  Intimate Partner Violence: Not At Risk (10/29/2018)   Humiliation, Afraid, Rape, and Kick questionnaire    Fear of Current or Ex-Partner: No    Emotionally Abused: No    Physically Abused: No    Sexually Abused: No     Constitutional: Pt reports fatigue, headache, fever, chills. Denies fever, malaise, or abrupt weight changes.  HEENT: Pt reports runny nose, nasal congestion and sore throat. Denies eye pain, eye redness, ear pain, ringing in the ears, wax buildup or bloody nose. Respiratory: Pt reports cough. Denies difficulty breathing, shortness of breath, or sputum production.   Cardiovascular: Denies chest pain, chest tightness, palpitations or swelling in the hands or  feet.  Gastrointestinal: Denies abdominal pain, bloating, constipation, diarrhea or blood in the stool.  GU: Pt reports erectile dysfunction. Denies urgency, frequency, pain with urination, burning sensation, blood in urine, odor or discharge. Musculoskeletal: Pt reports weakness. Denies decrease in range of motion, difficulty with gait, muscle pain or joint pain and swelling.  Skin: Denies redness, rashes, lesions or ulcercations.  Neurological: Denies dizziness, difficulty with memory, difficulty with speech or problems with balance and coordination.  Psych: Pt has a history of anxiety. Denies depression, SI/HI.  No other specific  complaints in a complete review of systems (except as listed in HPI above).  Observations/Objective:   Wt Readings from Last 3 Encounters:  04/13/24 192 lb (87.1 kg)  10/13/23 201 lb 12.8 oz (91.5 kg)  07/29/23 200 lb (90.7 kg)    General: Appears his stated age, appears unwell but in NAD. HEENT: Head: normal shape and size; Nose: no congestion noted; Throat/Mouth: no hoarseness noted Pulmonary/Chest: Normal effort. No respiratory distress.  Neurological: Alert and oriented. Psych: Mood and affect normal.  Behavior is normal.  Judgment and thought content normal.  BMET    Component Value Date/Time   NA 135 04/13/2024 0823   K 3.9 04/13/2024 0823   CL 98 04/13/2024 0823   CO2 26 04/13/2024 0823   GLUCOSE 97 04/13/2024 0823   BUN 6 (L) 04/13/2024 0823   CREATININE 0.85 04/13/2024 0823   CALCIUM 9.3 04/13/2024 0823   GFRNONAA 114 05/26/2020 0856   GFRAA 132 05/26/2020 0856    Lipid Panel     Component Value Date/Time   CHOL 200 (H) 04/13/2024 0823   TRIG 131 04/13/2024 0823   HDL 69 04/13/2024 0823   CHOLHDL 2.9 04/13/2024 0823   LDLCALC 106 (H) 04/13/2024 0823    CBC    Component Value Date/Time   WBC 6.7 04/13/2024 0823   RBC 4.90 04/13/2024 0823   HGB 16.0 04/13/2024 0823   HCT 47.0 04/13/2024 0823   PLT 199 04/13/2024 0823   MCV  95.9 04/13/2024 0823   MCH 32.7 04/13/2024 0823   MCHC 34.0 04/13/2024 0823   RDW 12.9 04/13/2024 0823   LYMPHSABS 1,408 05/26/2020 0856   EOSABS 31 05/26/2020 0856   BASOSABS 51 05/26/2020 0856    Hgb A1C Lab Results  Component Value Date   HGBA1C 5.0 04/13/2024       Assessment and Plan:  Assessment and Plan    Suspected influenza infection with fatigue Symptoms align with influenza. COVID negative. Fatigue affects work. Antivirals not an option due to symptom duration. Steroids considered for energy. - Prescribed prednisone  20 mg for 5 days. - Advised rest and symptomatic management. - Provided work note for absence from October 13th to 15th.  Bipolar depression, rectal dysfunction Experiencing sexual side effects from Risperdal . Interested in alternatives like Lamictal . No current psychiatrist. - Investigate alternative medications with fewer sexual side effects, including Lamictal . - Consider psychiatry referral for medication management.  Anxiety disorder Anxiety managed with clonazepam , used sparingly. - Refilled clonazepam  prescription.       RTC in 2 months, followup  chronic conditions Follow Up Instructions:    I discussed the assessment and treatment plan with the patient. The patient was provided an opportunity to ask questions and all were answered. The patient agreed with the plan and demonstrated an understanding of the instructions.   The patient was advised to call back or seek an in-person evaluation if the symptoms worsen or if the condition fails to improve as anticipated.   Angeline Laura, NP

## 2024-08-07 ENCOUNTER — Encounter: Payer: Self-pay | Admitting: Internal Medicine

## 2024-08-09 ENCOUNTER — Ambulatory Visit: Payer: Self-pay | Admitting: *Deleted

## 2024-08-09 ENCOUNTER — Ambulatory Visit

## 2024-08-09 ENCOUNTER — Encounter: Payer: Self-pay | Admitting: Internal Medicine

## 2024-08-09 VITALS — BP 130/100 | HR 127 | Ht 72.0 in | Wt 198.6 lb

## 2024-08-09 DIAGNOSIS — B349 Viral infection, unspecified: Secondary | ICD-10-CM | POA: Diagnosis not present

## 2024-08-09 DIAGNOSIS — L509 Urticaria, unspecified: Secondary | ICD-10-CM | POA: Diagnosis not present

## 2024-08-09 NOTE — Telephone Encounter (Signed)
 FYI Only or Action Required?: FYI only for provider.  Patient was last seen in primary care on 08/05/2024 by Antonette Angeline ORN, NP.  Called Nurse Triage reporting Rash.  Symptoms began several days ago.  Interventions attempted: Nothing.  Symptoms are: gradually worsening.  Triage Disposition: See Physician Within 24 Hours  Patient/caregiver understands and will follow disposition?: Yes   Reason for Disposition  Hives or itching  Answer Assessment - Initial Assessment Questions 1. APPEARANCE of RASH: What does the rash look like? (e.g., spots, blisters, raised areas, skin peeling, scaly)     Red bumps, and hive red 2. SIZE: How big are the spots? (e.g., tip of pen, eraser, coin; inches, centimeters)     Tip pin 3. LOCATION: Where is the rash located?     Arms and legs, torso 4. COLOR: What color is the rash? (Note: It is difficult to assess rash color in people with darker-colored skin. When this situation occurs, simply ask the caller to describe what they see.)     red 5. ONSET: When did the rash begin?     2 days ago 6. FEVER: Do you have a fever? If Yes, ask: What is your temperature, how was it measured, and when did it start?     no 7. ITCHING: Does the rash itch? If Yes, ask: How bad is the itch? (Scale 1-10; or mild, moderate, severe)     Yes- moderate 8. CAUSE: What do you think is causing the rash?     unsure 9. NEW MEDICINES: What new medicines are you taking? (e.g., name of antibiotic) When did you start taking this medication?.     prednisone  10. OTHER SYMPTOMS: Do you have any other symptoms? (e.g., sore throat, fever, joint pain)       Lethargic, sore throat, no appititie  Protocols used: Rash - Widespread On Drugs-A-AH   Copied from CRM #[id number]. Topic: Clinical - Red Word Triage >> Aug 09, 2024 12:01 PM Selinda RAMAN wrote: Red Word that prompted transfer to Nurse Triage: The patient called in stating he had a virtual visit on Thursday  where he was told he probably has the flu. He has been taking predniSONE  (DELTASONE ) 20 MG tablet and he only has one day left. He states he doesn't feel much better and is till very lethargic and now he has a full body rash that is really bothering him. I will transfer him to [facility].

## 2024-08-09 NOTE — Progress Notes (Signed)
 Acute Patient Visit  Physician: Morgyn Marut A Maudie Shingledecker, MD  Patient: Dominic Barnes MRN: [id number] DOB: Jan 23, 1991 PCP: Antonette Angeline ORN, NP     Subjective:   Chief Complaint  Patient presents with   Fatigue    Flu like symptoms since last Monday. Isn't feeling any better. Has chills and rash over his stomach, legs and arms. Patient is taken over the counter cold medicine.     HPI: The patient is a 33 y.o. male who presents today for:   Discussed the use of AI scribe software for clinical note transcription with the patient, who gave verbal consent to proceed.  History of Present Illness   [name] is a 33 year old male who presents with a rash and flu-like symptoms.  Generalized rash - Rash developed two days prior to visit and has worsened by the morning of the encounter - Rash is pruritic and covers the entire body - Rash appeared after onset of flu-like symptoms  Constitutional symptoms - Extreme fatigue, malaise. - No significant fever - No appetite and has not eaten on the day of the visit - Has been consuming fluids (Body Armor, Gatorade, water) to maintain hydration  Respiratory and flu-like symptoms - Initial symptoms included a head cold on Monday - Cough but no shortness of breath present - No sore throat or ear pain.  Did have several episodes of vomiting.  No stomach pain now or nausea. - Performed COVID test on previous Monday, which was negative - Received flu shot earlier in the year  Medication use - Prescribed prednisone  after virtual visit for suspected flu to help with energy levels   ROS:   As noted in the HPI  ASSESMENT/PLAN:  Encounter Diagnoses  Name Primary?   Urticaria Yes   Viral infection     No orders of the defined types were placed in this encounter.   Assessment and Plan    Viral illness with rash and gastrointestinal symptoms Differential includes flu strain or COVID-19 despite negative test. Prednisone   not recommended due to immune suppression. - Cetirizine  20 mg daily as directed for rash. - Encourage rest and increased fluid intake, including electrolytes. - Advise ibuprofen  or acetaminophen  as needed for symptom relief. - Monitor for high fever or shortness of breath indicating possible secondary bacterial pneumonia. - Encourage gradual reintroduction of food as tolerated.  - Mild tachycardia today due to acute illness with BP elevation.  Monitor at home.   - Ensure adequate rest and hydration.     Work note given for the week       OBJECTIVE: Vitals:   08/09/24 1309  BP: (!) 130/100  Pulse: (!) 127  SpO2: 94%  Weight: 198 lb 9.6 oz (90.1 kg)  Height: 6' (1.829 m)    Body mass index is 26.94 kg/m.   Physical Exam Vitals reviewed.  Constitutional:      Appearance: Normal appearance. Well-developed with normal weight.  Cardiovascular:     Rate and Rhythm: Normal rate and regular rhythm. Normal heart sounds. Normal peripheral pulses Pulmonary:     Normal breath sounds with normal effort Skin:    General: Skin is warm and dry without noticeable rash. Neurological:     General: No focal deficit present.  Psychiatric:        Mood and Affect: Mood, behavior and cognition normal       Allergies Patient has no known allergies.  Past Medical History Patient  has  a past medical history of Anxiety, Arthritis, Asthma, Headache, Sinus trouble, and Testicle pain.  Surgical History Patient  has a past surgical history that includes Tonsillectomy; Wisdom tooth extraction; Elbow surgery; Image guided sinus surgery (N/A, 08/30/2015); Maxillary antrostomy (Bilateral, 08/30/2015); Septoplasty (N/A, 08/30/2015); Turbinate reduction (Bilateral, 08/30/2015); and Nasal sinus surgery.  Family History Pateint's family history includes ADD / ADHD in his brother; Alcohol abuse in his father; Anxiety disorder in his father; Cirrhosis in his father; Depression in his father; Healthy in his  mother.  Social History Patient  reports that he quit smoking about 12 years ago. His smoking use included cigarettes. He has quit using smokeless tobacco. He reports current alcohol use of about 7.0 standard drinks of alcohol per week. He reports current drug use. Drug: Marijuana.    08/09/2024

## 2024-08-13 ENCOUNTER — Other Ambulatory Visit: Payer: Self-pay | Admitting: Internal Medicine

## 2024-08-14 NOTE — Telephone Encounter (Signed)
 Requested medication (s) are due for refill today: Yes  Requested medication (s) are on the active medication list: Yes  Last refill:  07/22/24  Future visit scheduled: Yes  Notes to clinic:  Unable to refill per protocol, cannot delegate.      Requested Prescriptions  Pending Prescriptions Disp Refills   risperiDONE  (RISPERDAL ) 2 MG tablet [Pharmacy Med Name: RISPERIDONE  2 MG TAB] 90 tablet 0    Sig: TAKE 1 TABLET BY MOUTH AT BEDTIME     Not Delegated - Psychiatry:  Antipsychotics - Second Generation (Atypical) - risperidone  Failed - 08/14/2024 10:13 PM      Failed - This refill cannot be delegated      Failed - TSH in normal range and within 360 days    TSH  Date Value Ref Range Status  05/26/2020 0.71 0.40 - 4.50 mIU/L Final         Failed - Last BP in normal range    BP Readings from Last 1 Encounters:  08/09/24 (!) 130/100         Failed - Last Heart Rate in normal range    Pulse Readings from Last 1 Encounters:  08/09/24 (!) 127         Failed - Lipid Panel in normal range within the last 12 months    Cholesterol  Date Value Ref Range Status  04/13/2024 200 (H) <200 mg/dL Final   LDL Cholesterol (Calc)  Date Value Ref Range Status  04/13/2024 106 (H) mg/dL (calc) Final    Comment:    Reference range: <100 . Desirable range <100 mg/dL for primary prevention;   <70 mg/dL for patients with CHD or diabetic patients  with > or = 2 CHD risk factors. SABRA LDL-C is now calculated using the Martin-Hopkins  calculation, which is a validated novel method providing  better accuracy than the Friedewald equation in the  estimation of LDL-C.  Gladis APPLETHWAITE et al. SANDREA. 7986;689(80): 2061-2068  (http://education.[facility].com/faq/FAQ164)    HDL  Date Value Ref Range Status  04/13/2024 69 > OR = 40 mg/dL Final   Triglycerides  Date Value Ref Range Status  04/13/2024 131 <150 mg/dL Final         Failed - CBC within normal limits and completed in the last 12  months    WBC  Date Value Ref Range Status  04/13/2024 6.7 3.8 - 10.8 Thousand/uL Final   RBC  Date Value Ref Range Status  04/13/2024 4.90 4.20 - 5.80 Million/uL Final   Hemoglobin  Date Value Ref Range Status  04/13/2024 16.0 13.2 - 17.1 g/dL Final   HCT  Date Value Ref Range Status  04/13/2024 47.0 38.5 - 50.0 % Final   MCHC  Date Value Ref Range Status  04/13/2024 34.0 32.0 - 36.0 g/dL Final    Comment:    For adults, a slight decrease in the calculated MCHC value (in the range of 30 to 32 g/dL) is most likely not clinically significant; however, it should be interpreted with caution in correlation with other red cell parameters and the patient's clinical condition.    [facility]  Date Value Ref Range Status  04/13/2024 32.7 27.0 - 33.0 pg Final   MCV  Date Value Ref Range Status  04/13/2024 95.9 80.0 - 100.0 fL Final   No results found for: PLTCOUNTKUC, LABPLAT, POCPLA RDW  Date Value Ref Range Status  04/13/2024 12.9 11.0 - 15.0 % Final         Passed - Completed  PHQ-2 or PHQ-9 in the last 360 days      Passed - Valid encounter within last 6 months    Recent Outpatient Visits           5 days ago Urticaria   [place] [facility], Dodson A, MD   1 week ago Influenza-like symptoms   [place] [facility] [place], [place], NP   4 months ago Encounter for general adult medical examination with abnormal findings   [place] [facility] [place], Angeline ORN, NP              Passed - CMP within normal limits and completed in the last 12 months    No results found for: ALBUMIN, ALBMS, POCALB, ALBUMINELP Alkaline phosphatase (APISO)  Date Value Ref Range Status  04/13/2024 84 36 - 130 U/L Final   ALT  Date Value Ref Range Status  04/13/2024 69 (H) 9 - 46 U/L Final   AST  Date Value Ref Range Status  04/13/2024 63 (H) 10 - 40 U/L Final   BUN  Date Value Ref Range Status   04/13/2024 6 (L) 7 - 25 mg/dL Final   Calcium  Date Value Ref Range Status  04/13/2024 9.3 8.6 - 10.3 mg/dL Final   CO2  Date Value Ref Range Status  04/13/2024 26 20 - 32 mmol/L Final   Creat  Date Value Ref Range Status  04/13/2024 0.85 0.60 - 1.26 mg/dL Final   Glucose, Bld  Date Value Ref Range Status  04/13/2024 97 65 - 99 mg/dL Final    Comment:    .            Fasting reference interval .    Potassium  Date Value Ref Range Status  04/13/2024 3.9 3.5 - 5.3 mmol/L Final   Sodium  Date Value Ref Range Status  04/13/2024 135 135 - 146 mmol/L Final   Total Bilirubin  Date Value Ref Range Status  04/13/2024 1.7 (H) 0.2 - 1.2 mg/dL Final   Protein, UA  Date Value Ref Range Status  06/11/2018 Negative Negative Final  11/28/2015 Negative Negative/Trace Final   Total Protein  Date Value Ref Range Status  04/13/2024 7.2 6.1 - 8.1 g/dL Final   GFR, Est African American  Date Value Ref Range Status  05/26/2020 132 > OR = 60 mL/min/1.3m2 Final   eGFR  Date Value Ref Range Status  04/13/2024 118 > OR = 60 mL/min/1.24m2 Final   GFR, Est Non African American  Date Value Ref Range Status  05/26/2020 114 > OR = 60 mL/min/1.85m2 Final         Signed Prescriptions Disp Refills   pantoprazole  (PROTONIX ) 40 MG tablet 90 tablet 1    Sig: TAKE 1 TABLET BY MOUTH ONCE DAILY     Gastroenterology: Proton Pump Inhibitors Passed - 08/14/2024 10:13 PM      Passed - Valid encounter within last 12 months    Recent Outpatient Visits           5 days ago Urticaria   [place] [facility] Everlene Parris LABOR, MD   1 week ago Influenza-like symptoms   [place] [facility] [place], Angeline ORN, NP   4 months ago Encounter for general adult medical examination with abnormal findings   [place] [facility] [place], Angeline ORN, NP

## 2024-10-01 ENCOUNTER — Other Ambulatory Visit: Payer: Self-pay | Admitting: Internal Medicine

## 2024-10-01 DIAGNOSIS — F410 Panic disorder [episodic paroxysmal anxiety] without agoraphobia: Secondary | ICD-10-CM

## 2024-10-01 DIAGNOSIS — F411 Generalized anxiety disorder: Secondary | ICD-10-CM

## 2024-10-04 NOTE — Telephone Encounter (Signed)
 Requested medication (s) are due for refill today: yes  Requested medication (s) are on the active medication list: yes  Last refill:  08/05/24  Future visit scheduled: yes  Notes to clinic:  Unable to refill per protocol, cannot delegate.      Requested Prescriptions  Pending Prescriptions Disp Refills   clonazePAM  (KLONOPIN ) 1 MG tablet [Pharmacy Med Name: CLONAZEPAM  1 MG TAB] 10 tablet     Sig: TAKE 1 TABLET BY MOUTH ONCE DAILY AS NEEDED FOR ANXIETY     Not Delegated - Psychiatry: Anxiolytics/Hypnotics 2 Failed - 10/04/2024  3:43 PM      Failed - This refill cannot be delegated      Failed - Urine Drug Screen completed in last 360 days      Passed - Patient is not pregnant      Passed - Valid encounter within last 6 months    Recent Outpatient Visits           1 month ago Urticaria   [place] [facility] Everlene Parris LABOR, MD   2 months ago Influenza-like symptoms   [place] [facility] [place], Angeline ORN, NP   5 months ago Encounter for general adult medical examination with abnormal findings   [place] [facility] [place], Angeline ORN, NP

## 2024-10-18 NOTE — Progress Notes (Deleted)
 "  Subjective:    Patient ID: Dominic Barnes, male    DOB: 1991-07-22, 33 y.o.   MRN: [id number]  HPI  Patient presents to clinic today for follow-up of chronic conditions.  IBS: Mainly diarrhea. He does not take any medications for this. Colonoscopy from 10/2017 reviewed.  Anxiety, panic attacks and depression: Chronic, managed with clonazepam  and risperidone .  He is no longer taking buspirone .  He is not currently seeing a therapist or psychiatrist.  He denies SI/HI.  GERD: Triggered by.  He denies breakthrough on pantoprazole .  There is no upper GI on file.  Insomnia: He has difficulty staying asleep.  He is taking risperidone  as prescribed.  There is no sleep study on file.  HTN: His BP today is 142/88.  He is taking losartan  as prescribed.  ECG from 10/2018 reviewed.  ED: Managed with sildenafil .  He does not follow with urology.  HLD: His last LDL was 106, triglycerides 130, 03/2024.  He is not taking any cholesterol-lowering medication at this time.  He does not consume a low-fat diet.  Chronic left side low back pain: Managed with ibuprofen  as needed.  There is no imaging on file.  He follows with a land.   Elevated liver enzymes: His last AST/ALT was 63/69, 03/2024.  RUQ abdominal ultrasound from 03/2024 reviewed.  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 1 episiode. Approx age 81.   Headache    sinus   Sinus trouble    Testicle pain     Current Outpatient Medications  Medication Sig Dispense Refill   clonazePAM  (KLONOPIN ) 1 MG tablet TAKE 1 TABLET BY MOUTH ONCE DAILY AS NEEDED FOR ANXIETY 10 tablet 0   losartan  (COZAAR ) 50 MG tablet Take 1 tablet (50 mg total) by mouth daily. 90 tablet 1   ondansetron  (ZOFRAN -ODT) 4 MG disintegrating tablet Take 1 tablet (4 mg total) by mouth every 8 (eight) hours as needed for nausea or vomiting. 30 tablet 1   pantoprazole   (PROTONIX ) 40 MG tablet TAKE 1 TABLET BY MOUTH ONCE DAILY 90 tablet 1   predniSONE  (DELTASONE ) 20 MG tablet Take 1 tablet (20 mg total) by mouth daily with breakfast. (Patient not taking: Reported on 08/09/2024) 5 tablet 0   risperiDONE  (RISPERDAL ) 2 MG tablet TAKE 1 TABLET BY MOUTH AT BEDTIME 90 tablet 0   sildenafil  (VIAGRA ) 100 MG tablet Take 1 tablet (100 mg total) by mouth as needed for erectile dysfunction. 10 tablet 5   No current facility-administered medications for this visit.    No Known Allergies  Family History  Problem Relation Age of Onset   Healthy Mother    Cirrhosis Father    Anxiety disorder Father    Depression Father    Alcohol abuse Father    ADD / ADHD Brother    Kidney disease Neg Hx    Prostate cancer Neg Hx    Colon cancer Neg Hx     Social History   Socioeconomic History   Marital status: Single    Spouse name: Not on file   Number of children: 0   Years of education: Not on file   Highest education level: Some college, no degree  Occupational History   Not on file  Tobacco Use   Smoking status: Former    Current packs/day: 0.00  Types: Cigarettes    Quit date: 07/21/2012    Years since quitting: 12.2   Smokeless tobacco: Former   Tobacco comments:    quit 5 years /occasional dip  Vaping Use   Vaping status: Never Used  Substance and Sexual Activity   Alcohol use: Yes    Alcohol/week: 7.0 standard drinks of alcohol    Types: 7 Cans of beer per week    Comment: socially   Drug use: Yes    Types: Marijuana    Comment: last used 2 weeks    Sexual activity: Not Currently  Other Topics Concern   Not on file  Social History Narrative   Not on file   Social Drivers of Health   Tobacco Use: Medium Risk (08/05/2024)   Patient History    Smoking Tobacco Use: Former    Smokeless Tobacco Use: Former    Passive Exposure: Not on Stage Manager: Not on Ship Broker Insecurity: Not on file  Transportation Needs: Not on file   Physical Activity: Not on file  Stress: Not on file  Social Connections: Not on file  Intimate Partner Violence: Not on file  Depression (PHQ2-9): High Risk (04/13/2024)   Depression (PHQ2-9)    PHQ-2 Score: 14  Alcohol Screen: Low Risk (04/11/2023)   Alcohol Screen    Last Alcohol Screening Score (AUDIT): 4  Housing: Not on file  Utilities: Not on file  Health Literacy: Not on file     Constitutional: Denies fever, malaise, fatigue, headache or abrupt weight changes.  HEENT: Denies eye pain, eye redness, ear pain, ringing in the ears, wax buildup, runny nose, nasal congestion, bloody nose, or sore throat. Respiratory: Denies difficulty breathing, shortness of breath, cough or sputum production.   Cardiovascular: Denies chest pain, chest tightness, palpitations or swelling in the hands or feet.  Gastrointestinal: Patient reports intermittent diarrhea.  Denies abdominal pain, bloating, constipation,  or blood in the stool.  GU: Denies urgency, frequency, pain with urination, burning sensation, blood in urine, odor or discharge. Musculoskeletal: Pt reports chronic left side back pain. Denies decrease in range of motion, difficulty with gait, muscle pain or joint pain and swelling.  Skin: Denies redness, rashes, lesions or ulcercations.  Neurological: Patient reports insomnia.  Denies dizziness, difficulty with memory, difficulty with speech or problems with balance and coordination.  Psych: Patient has a history of anxiety and depression.  Denies SI/HI.  No other specific complaints in a complete review of systems (except as listed in HPI above).  Objective:   Physical Exam There were no vitals taken for this visit.  Wt Readings from Last 3 Encounters:  08/09/24 198 lb 9.6 oz (90.1 kg)  04/13/24 192 lb (87.1 kg)  10/13/23 201 lb 12.8 oz (91.5 kg)    General: Appears his stated age, overweight, in NAD. Skin: Warm, dry and intact.  HEENT: Head: normal shape and size; Eyes: sclera  white, no icterus, conjunctiva pink, PERRLA and EOMs intact;  Cardiovascular: Normal rate and rhythm. S1,S2 noted.  No murmur, rubs or gallops noted. No JVD or BLE edema. Pulmonary/Chest: Normal effort and positive vesicular breath sounds. No respiratory distress. No wheezes, rales or ronchi noted.  Abdomen: Soft and nontender.  Musculoskeletal: Strength 5/5 BLE.  No difficulty with gait.  Neurological: Alert and oriented. Cranial nerves II-XII grossly intact. Coordination normal.  Psychiatric: Mood and affect mild normal. Behavior is normal. Judgment and thought content normal.     BMET    Component Value  Date/Time   NA 135 04/13/2024 0823   K 3.9 04/13/2024 0823   CL 98 04/13/2024 0823   CO2 26 04/13/2024 0823   GLUCOSE 97 04/13/2024 0823   BUN 6 (L) 04/13/2024 0823   CREATININE 0.85 04/13/2024 0823   CALCIUM 9.3 04/13/2024 0823   GFRNONAA 114 05/26/2020 0856   GFRAA 132 05/26/2020 0856    Lipid Panel     Component Value Date/Time   CHOL 200 (H) 04/13/2024 0823   TRIG 131 04/13/2024 0823   HDL 69 04/13/2024 0823   CHOLHDL 2.9 04/13/2024 0823   LDLCALC 106 (H) 04/13/2024 0823    CBC    Component Value Date/Time   WBC 6.7 04/13/2024 0823   RBC 4.90 04/13/2024 0823   HGB 16.0 04/13/2024 0823   HCT 47.0 04/13/2024 0823   PLT 199 04/13/2024 0823   MCV 95.9 04/13/2024 0823   MCH 32.7 04/13/2024 0823   MCHC 34.0 04/13/2024 0823   RDW 12.9 04/13/2024 0823   LYMPHSABS 1,408 05/26/2020 0856   EOSABS 31 05/26/2020 0856   BASOSABS 51 05/26/2020 0856    Hgb A1C Lab Results  Component Value Date   HGBA1C 5.0 04/13/2024            Assessment & Plan:    RTC in 6 months for your annual exam Angeline Laura, NP   "

## 2024-10-19 ENCOUNTER — Ambulatory Visit: Payer: Self-pay | Admitting: Internal Medicine

## 2024-10-25 DIAGNOSIS — B349 Viral infection, unspecified: Secondary | ICD-10-CM | POA: Diagnosis not present

## 2024-10-25 DIAGNOSIS — J111 Influenza due to unidentified influenza virus with other respiratory manifestations: Secondary | ICD-10-CM

## 2024-10-25 MED ORDER — OSELTAMIVIR PHOSPHATE 75 MG PO CAPS: 75.0000 mg | ORAL_CAPSULE | Freq: Two times a day (BID) | ORAL | 0 refills | Status: AC

## 2024-10-25 MED ORDER — LIDOCAINE VISCOUS HCL 2 % MT SOLN: 15.0000 mL | OROMUCOSAL | 0 refills | Status: AC | PRN

## 2024-10-25 NOTE — Patient Instructions (Signed)
 " Dominic Barnes, thank you for joining Jon CHRISTELLA Belt, NP for today's virtual visit.  While this provider is not your primary care provider (PCP), if your PCP is located in our provider database this encounter information will be shared with them immediately following your visit.   A [facility] MyChart account gives you access to today's visit and all your visits, tests, and labs performed at [facility]  click here if you don't have a [facility] [place] MyChart account or go to [email or web address]  Consent: (Patient) Dominic Barnes provided verbal consent for this virtual visit at the beginning of the encounter.  Current Medications:  Current Outpatient Medications:    lidocaine  (XYLOCAINE ) 2 % solution, Use as directed 15 mLs in the mouth or throat as needed for mouth pain., Disp: 100 mL, Rfl: 0   oseltamivir  (TAMIFLU ) 75 MG capsule, Take 1 capsule (75 mg total) by mouth 2 (two) times daily., Disp: 10 capsule, Rfl: 0   clonazePAM  (KLONOPIN ) 1 MG tablet, TAKE 1 TABLET BY MOUTH ONCE DAILY AS NEEDED FOR ANXIETY, Disp: 10 tablet, Rfl: 0   losartan  (COZAAR ) 50 MG tablet, Take 1 tablet (50 mg total) by mouth daily., Disp: 90 tablet, Rfl: 1   ondansetron  (ZOFRAN -ODT) 4 MG disintegrating tablet, Take 1 tablet (4 mg total) by mouth every 8 (eight) hours as needed for nausea or vomiting., Disp: 30 tablet, Rfl: 1   pantoprazole  (PROTONIX ) 40 MG tablet, TAKE 1 TABLET BY MOUTH ONCE DAILY, Disp: 90 tablet, Rfl: 1   predniSONE  (DELTASONE ) 20 MG tablet, Take 1 tablet (20 mg total) by mouth daily with breakfast. (Patient not taking: Reported on 08/09/2024), Disp: 5 tablet, Rfl: 0   risperiDONE  (RISPERDAL ) 2 MG tablet, TAKE 1 TABLET BY MOUTH AT BEDTIME, Disp: 90 tablet, Rfl: 0   sildenafil  (VIAGRA ) 100 MG tablet, Take 1 tablet (100 mg total) by mouth as needed for erectile dysfunction., Disp: 10 tablet, Rfl: 5   Medications ordered in this encounter:  Meds ordered this encounter  Medications    oseltamivir  (TAMIFLU ) 75 MG capsule    Sig: Take 1 capsule (75 mg total) by mouth 2 (two) times daily.    Dispense:  10 capsule    Refill:  0   lidocaine  (XYLOCAINE ) 2 % solution    Sig: Use as directed 15 mLs in the mouth or throat as needed for mouth pain.    Dispense:  100 mL    Refill:  0     *If you need refills on other medications prior to your next appointment, please contact your pharmacy*  Follow-Up: Call back or seek an in-person evaluation if the symptoms worsen or if the condition fails to improve as anticipated.  [facility] [phone]  Other Instructions  Continue taking over the counter cold medicines to help your symptoms.   Instead of the viscous lidocaine , you can try throat lozenges or sprays for your sore throat if you prefer   If you have been instructed to have an in-person evaluation today at a local Urgent Care facility, please use the link below. It will take you to a list of all of our available [facility], including address, phone number and hours of operation. Please do not delay care.  [facility]  If you or a family member do not have a primary care provider, use the link below to schedule a visit and establish care. When you choose a [facility] primary care physician  or advanced practice provider, you gain a long-term partner in health. Find a Primary Care Provider  Learn more about [facility]'s in-office and virtual care options: [facility] - Get Care Now  "

## 2024-10-25 NOTE — Progress Notes (Signed)
 " Virtual Visit Consent   Dominic Barnes, you are scheduled for a virtual visit with a [facility] provider today. Just as with appointments in the office, your consent must be obtained to participate. Your consent will be active for this visit and any virtual visit you may have with one of our providers in the next 365 days. If you have a MyChart account, a copy of this consent can be sent to you electronically.  As this is a virtual visit, video technology does not allow for your provider to perform a traditional examination. This may limit your provider's ability to fully assess your condition. If your provider identifies any concerns that need to be evaluated in person or the need to arrange testing (such as labs, EKG, etc.), we will make arrangements to do so. Although advances in technology are sophisticated, we cannot ensure that it will always work on either your end or our end. If the connection with a video visit is poor, the visit may have to be switched to a telephone visit. With either a video or telephone visit, we are not always able to ensure that we have a secure connection.  By engaging in this virtual visit, you consent to the provision of healthcare and authorize for your insurance to be billed (if applicable) for the services provided during this visit. Depending on your insurance coverage, you may receive a charge related to this service.  I need to obtain your verbal consent now. Are you willing to proceed with your visit today? Dominic Barnes has provided verbal consent on 10/25/2024 for a virtual visit (video or telephone). Jon CHRISTELLA Belt, NP  Date: 10/25/2024 2:12 PM   Virtual Visit via Video Note   I, Jon CHRISTELLA Belt, connected with  Dominic Barnes  ([id number], 09/12/1991) on 10/25/2024 at  2:00 PM EST by a video-enabled telemedicine application and verified that I am speaking with the correct person using two identifiers.  Location: Patient: Virtual Visit Location Patient:  Home Provider: Virtual Visit Location Provider: Home Office   I discussed the limitations of evaluation and management by telemedicine and the availability of in person appointments. The patient expressed understanding and agreed to proceed.    History of Present Illness: Dominic Barnes is a 34 y.o. who identifies as a male who was assigned male at birth, and is being seen today for possible flu   Woke up at 3am morning yesterday morning with coughing, Temp was 101F at that time. Body aches, chills, fever, coughing, congestion, sore throat.  Temp max 103 this morning.   Took RSV, flu, covid - negative for all 3.   Taking tylenol  cold and flu.   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 but no for certain known flu exposures  HPI: HPI  Problems:  Patient Active Problem List   Diagnosis Date Noted   Urticaria 08/09/2024   Viral infection 08/09/2024   Chronic left-sided low back pain 10/13/2023   ED (erectile dysfunction) 10/10/2022   Primary hypertension 08/20/2022   Overweight with body mass index (BMI) of 26 to 26.9 in adult 02/18/2022   Insomnia 05/25/2020   Bipolar 2 disorder, major depressive episode (HCC) 04/20/2019   GAD (generalized anxiety disorder) 04/20/2019   Panic attack 04/20/2019   IBS (irritable bowel syndrome) 06/12/2018    Allergies: Allergies[1] Medications: Current Medications[2]  Observations/Objective: Patient is well-developed, well-nourished in no acute distress.  Resting comfortably  at home.  Head is normocephalic, atraumatic.  No labored breathing.  Speech is clear and coherent with logical content.  Patient is alert and oriented at baseline.  Sounds congested on video. Frequent dry cough  Assessment and Plan: 1. Viral infection (Primary) - oseltamivir  (TAMIFLU ) 75 MG capsule; Take 1 capsule (75 mg total) by mouth 2 (two) times daily.  Dispense: 10 capsule; Refill: 0 - lidocaine  (XYLOCAINE ) 2 % solution; Use as directed 15 mLs in the  mouth or throat as needed for mouth pain.  Dispense: 100 mL; Refill: 0  2. Influenza-like illness   I suspect flu even though home test is negative given abrupt onset of sx and sx quality.   Follow Up Instructions: I discussed the assessment and treatment plan with the patient. The patient was provided an opportunity to ask questions and all were answered. The patient agreed with the plan and demonstrated an understanding of the instructions.  A copy of instructions were sent to the patient via MyChart unless otherwise noted below.   The patient was advised to call back or seek an in-person evaluation if the symptoms worsen or if the condition fails to improve as anticipated.    Jon CHRISTELLA Belt, NP    [1] No Known Allergies [2]  Current Outpatient Medications:    lidocaine  (XYLOCAINE ) 2 % solution, Use as directed 15 mLs in the mouth or throat as needed for mouth pain., Disp: 100 mL, Rfl: 0   oseltamivir  (TAMIFLU ) 75 MG capsule, Take 1 capsule (75 mg total) by mouth 2 (two) times daily., Disp: 10 capsule, Rfl: 0   clonazePAM  (KLONOPIN ) 1 MG tablet, TAKE 1 TABLET BY MOUTH ONCE DAILY AS NEEDED FOR ANXIETY, Disp: 10 tablet, Rfl: 0   losartan  (COZAAR ) 50 MG tablet, Take 1 tablet (50 mg total) by mouth daily., Disp: 90 tablet, Rfl: 1   ondansetron  (ZOFRAN -ODT) 4 MG disintegrating tablet, Take 1 tablet (4 mg total) by mouth every 8 (eight) hours as needed for nausea or vomiting., Disp: 30 tablet, Rfl: 1   pantoprazole  (PROTONIX ) 40 MG tablet, TAKE 1 TABLET BY MOUTH ONCE DAILY, Disp: 90 tablet, Rfl: 1   predniSONE  (DELTASONE ) 20 MG tablet, Take 1 tablet (20 mg total) by mouth daily with breakfast. (Patient not taking: Reported on 08/09/2024), Disp: 5 tablet, Rfl: 0   risperiDONE  (RISPERDAL ) 2 MG tablet, TAKE 1 TABLET BY MOUTH AT BEDTIME, Disp: 90 tablet, Rfl: 0   sildenafil  (VIAGRA ) 100 MG tablet, Take 1 tablet (100 mg total) by mouth as needed for erectile dysfunction., Disp: 10 tablet, Rfl:  5  "

## 2024-11-23 ENCOUNTER — Other Ambulatory Visit: Payer: Self-pay | Admitting: Internal Medicine
